# Patient Record
Sex: Female | Born: 2000 | Race: Black or African American | Hispanic: No | Marital: Single | Smoking: Never smoker
Health system: Southern US, Community
[De-identification: ages and names within clinical notes are randomized; demographics above are authoritative.]

## PROBLEM LIST (undated history)

## (undated) DIAGNOSIS — F319 Bipolar disorder, unspecified: Secondary | ICD-10-CM

## (undated) DIAGNOSIS — Z91148 Patient's other noncompliance with medication regimen for other reason: Secondary | ICD-10-CM

## (undated) DIAGNOSIS — Z8659 Personal history of other mental and behavioral disorders: Secondary | ICD-10-CM

## (undated) DIAGNOSIS — F32A Depression, unspecified: Secondary | ICD-10-CM

## (undated) DIAGNOSIS — F329 Major depressive disorder, single episode, unspecified: Secondary | ICD-10-CM

## (undated) DIAGNOSIS — F4310 Post-traumatic stress disorder, unspecified: Secondary | ICD-10-CM

## (undated) DIAGNOSIS — F603 Borderline personality disorder: Secondary | ICD-10-CM

## (undated) DIAGNOSIS — T1491XA Suicide attempt, initial encounter: Secondary | ICD-10-CM

## (undated) DIAGNOSIS — F909 Attention-deficit hyperactivity disorder, unspecified type: Secondary | ICD-10-CM

## (undated) DIAGNOSIS — F411 Generalized anxiety disorder: Secondary | ICD-10-CM

## (undated) DIAGNOSIS — F419 Anxiety disorder, unspecified: Secondary | ICD-10-CM

## (undated) DIAGNOSIS — E669 Obesity, unspecified: Secondary | ICD-10-CM

## (undated) HISTORY — PX: FOOT SURGERY: SHX648

---
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
## 2020-01-20 ENCOUNTER — Emergency Department (HOSPITAL_COMMUNITY): Payer: Medicaid Other

## 2020-01-20 ENCOUNTER — Other Ambulatory Visit: Payer: Self-pay

## 2020-01-20 ENCOUNTER — Emergency Department (HOSPITAL_COMMUNITY)
Admission: EM | Admit: 2020-01-20 | Discharge: 2020-01-20 | Disposition: A | Discharge status: Home / Self Care | Payer: Medicaid Other | Attending: Emergency Medicine | Admitting: Emergency Medicine

## 2020-01-20 ENCOUNTER — Encounter (HOSPITAL_COMMUNITY): Payer: Self-pay | Admitting: Emergency Medicine

## 2020-01-20 DIAGNOSIS — Z91010 Allergy to peanuts: Secondary | ICD-10-CM | POA: Diagnosis not present

## 2020-01-20 DIAGNOSIS — Y999 Unspecified external cause status: Secondary | ICD-10-CM | POA: Insufficient documentation

## 2020-01-20 DIAGNOSIS — Y939 Activity, unspecified: Secondary | ICD-10-CM | POA: Diagnosis not present

## 2020-01-20 DIAGNOSIS — Z79899 Other long term (current) drug therapy: Secondary | ICD-10-CM | POA: Insufficient documentation

## 2020-01-20 DIAGNOSIS — Y92009 Unspecified place in unspecified non-institutional (private) residence as the place of occurrence of the external cause: Secondary | ICD-10-CM | POA: Insufficient documentation

## 2020-01-20 DIAGNOSIS — Z23 Encounter for immunization: Secondary | ICD-10-CM | POA: Insufficient documentation

## 2020-01-20 DIAGNOSIS — W25XXXA Contact with sharp glass, initial encounter: Secondary | ICD-10-CM | POA: Insufficient documentation

## 2020-01-20 DIAGNOSIS — W458XXA Other foreign body or object entering through skin, initial encounter: Secondary | ICD-10-CM

## 2020-01-20 DIAGNOSIS — S90852A Superficial foreign body, left foot, initial encounter: Secondary | ICD-10-CM | POA: Insufficient documentation

## 2020-01-20 MED ORDER — LIDOCAINE HCL (PF) 1 % IJ SOLN
5.0000 mL | Freq: Once | INTRAMUSCULAR | Status: AC
Start: 2020-01-20 — End: 2020-01-20
  Administered 2020-01-20: 12:00:00 5 mL
  Filled 2020-01-20: qty 5

## 2020-01-20 MED ORDER — TETANUS-DIPHTH-ACELL PERTUSSIS 5-2.5-18.5 LF-MCG/0.5 IM SUSP
0.5000 mL | Freq: Once | INTRAMUSCULAR | Status: AC
Start: 2020-01-20 — End: 2020-01-20
  Administered 2020-01-20: 0.5 mL via INTRAMUSCULAR
  Filled 2020-01-20: qty 0.5

## 2020-01-20 NOTE — Discharge Instructions (Signed)
You may place Neosporin or bacitracin ointment to your wound.  This should close on its own.  Run warm soapy water over this.  Follow-up with primary care provider for wound check in 5 days.

## 2020-01-20 NOTE — ED Triage Notes (Signed)
Pt reports stepping on something with her L foot while cleaning her room earlier, states that she tried to get whatever it was out but was unable to.

## 2020-01-20 NOTE — ED Provider Notes (Signed)
[facility] EMERGENCY DEPARTMENT Provider Note   CSN: [id number] Arrival date & time: 01/20/20  1024     History Chief Complaint  Patient presents with  . Foot Injury    Tamara Guzman is a 19 y.o. female with past medical history significant for bipolar, anxiety who presents for evaluation of foreign object to left foot.  Patient states she stepped on unknown object to her room.  Patient assumes this is glass her did not see any broken glass.  She has noticed some pain to the plantar aspect of her left foot with possible dark object.  She denies fever, chills, nausea, vomiting, decreased range of motion, numbness, tingling, redness, swelling, warmth to her extremities.  Denies additional aggravating or alleviating factors.  Rates pain a 5/10 the plantar aspect of her left foot.  Unsure last tetanus.  Denies additional aggravating or alleviating factors.  History obtained from patient and past medical records.  No interpreter is used.  HPI     Past Medical History:  Diagnosis Date  . ADHD   . Anxiety   . Bipolar 1 disorder (HCC)   . Depression   . Obesity     Patient Active Problem List   Diagnosis Date Noted  . MDD (major depressive disorder), severe (HCC) 07/09/2018  . Post traumatic stress disorder (PTSD) 07/09/2018  . ADHD (attention deficit hyperactivity disorder) 07/09/2018    History reviewed. No pertinent surgical history.   OB History   No obstetric history on file.     No family history on file.  Social History   Tobacco Use  . Smoking status: Never Smoker  . Smokeless tobacco: Never Used  Substance Use Topics  . Alcohol use: Not Currently  . Drug use: Yes    Types: Marijuana    Home Medications Prior to Admission medications   Medication Sig Start Date End Date Taking? Authorizing Provider  ARIPiprazole (ABILIFY) 20 MG tablet Take 1 tablet (20 mg total) by mouth at bedtime. 07/13/18   Leata Mouse, MD  cloNIDine  HCl (KAPVAY) 0.1 MG TB12 ER tablet Take 2 tablets (0.2 mg total) by mouth at bedtime. 07/13/18   Leata Mouse, MD  dicyclomine (BENTYL) 20 MG tablet Take 20 mg by mouth 4 (four) times daily. 04/26/18   [provider]  FLUoxetine (PROZAC) 20 MG capsule Take 1 capsule (20 mg total) by mouth every morning. 07/13/18   Leata Mouse, MD  lansoprazole (PREVACID) 15 MG capsule Take 1 capsule (15 mg total) by mouth daily. 10/06/19   Niel Hummer, MD  loperamide (IMODIUM) 2 MG capsule Take 2 mg by mouth every 4 (four) hours as needed for diarrhea or loose stools.  04/26/18   [provider]  methylphenidate 36 MG PO CR tablet Take 1 tablet (36 mg total) by mouth every morning. 07/14/18   Leata Mouse, MD  ondansetron (ZOFRAN ODT) 4 MG disintegrating tablet Take 1 tablet (4 mg total) by mouth every 8 (eight) hours as needed for nausea or vomiting. 10/06/19   Niel Hummer, MD  ondansetron (ZOFRAN) 4 MG tablet Take 4 mg by mouth every 8 (eight) hours as needed for nausea. 04/26/18   [provider]  Oxcarbazepine (TRILEPTAL) 300 MG tablet Take 3 tablets (900 mg total) by mouth at bedtime. 07/13/18   Leata Mouse, MD  QUEtiapine (SEROQUEL) 25 MG tablet Take 1 tablet (25 mg total) by mouth at bedtime. 07/13/18   Leata Mouse, MD  ranitidine (ZANTAC) 150 MG capsule Take  150 mg by mouth 2 (two) times daily. 04/14/18   [provider]    Allergies    Other, Chocolate, and Peanut-containing drug products  Review of Systems   Review of Systems  Constitutional: Negative.   HENT: Negative.   Respiratory: Negative.   Cardiovascular: Negative.   Gastrointestinal: Negative.   Genitourinary: Negative.   Musculoskeletal: Negative.   Skin: Positive for wound.  Neurological: Negative.   All other systems reviewed and are negative.   Physical Exam Updated Vital Signs BP 114/74 (BP Location: Right Arm)   Pulse 89   Temp 98.3 F  (36.8 C) (Oral)   Resp 14   SpO2 100%   Physical Exam Vitals and nursing note reviewed.  Constitutional:      General: She is not in acute distress.    Appearance: She is well-developed. She is obese. She is not ill-appearing or toxic-appearing.  HENT:     Head: Normocephalic and atraumatic.     Nose: Nose normal.     Mouth/Throat:     Mouth: Mucous membranes are moist.     Pharynx: Oropharynx is clear.  Eyes:     Pupils: Pupils are equal, round, and reactive to light.  Cardiovascular:     Rate and Rhythm: Normal rate.     Pulses: Normal pulses.     Heart sounds: Normal heart sounds.  Pulmonary:     Effort: Pulmonary effort is normal. No respiratory distress.     Breath sounds: Normal breath sounds.  Abdominal:     General: Bowel sounds are normal. There is no distension.  Musculoskeletal:        General: No swelling or deformity. Normal range of motion.     Cervical back: Normal range of motion.     Right lower leg: No edema.     Comments: Moves all 4 extremities without difficulty.  Skin:    General: Skin is warm and dry.     Capillary Refill: Capillary refill takes less than 2 seconds.     Comments: Punctate area of darkened colored skin to plantar aspect of left foot between second and third metatarsal.  Possible palpable object however not visualized fully.  No bleeding, drainage, fluctuance, induration, erythema or warmth.  Neurological:     Mental Status: She is alert.     Comments: Ambulatory without difficulty     ED Results / Procedures / Treatments   Labs (all labs ordered are listed, but only abnormal results are displayed) Labs Reviewed - No data to display  EKG None  Radiology DG Foot Complete Left  Result Date: 01/20/2020 CLINICAL DATA:  19 year old female with possible foreign body at the foot EXAM: LEFT FOOT - COMPLETE 3+ VIEW COMPARISON:  None. FINDINGS: No acute displaced fracture. No focal soft tissue swelling. No degenerative changes. No  subluxation/dislocation. No subcutaneous gas. No radiopaque foreign body. IMPRESSION: Negative for acute bony abnormality. Negative for radiopaque foreign body. Electronically Signed   By: Gilmer Mor D.O.   On: 01/20/2020 11:38    Procedures .Foreign Body Removal  Date/Time: 01/20/2020 12:26 PM Performed by: Linwood Dibbles, PA-C Authorized by: Linwood Dibbles, PA-C  Consent: Verbal consent obtained. Written consent not obtained. Risks and benefits: risks, benefits and alternatives were discussed Consent given by: patient Patient understanding: patient states understanding of the procedure being performed Patient consent: the patient's understanding of the procedure matches consent given Procedure consent: procedure consent matches procedure scheduled Relevant documents: relevant documents present and verified Test results:  test results available and properly labeled Site marked: the operative site was marked Imaging studies: imaging studies available Required items: required blood products, implants, devices, and special equipment available Patient identity confirmed: verbally with patient Time out: Immediately prior to procedure a "time out" was called to verify the correct patient, procedure, equipment, support staff and site/side marked as required. Body area: skin General location: lower extremity Location details: left foot Anesthesia: local infiltration  Anesthesia: Local Anesthetic: lidocaine 1% without epinephrine Anesthetic total: 2 mL  Sedation: Patient sedated: no  Patient restrained: no Patient cooperative: yes Localization method: serial x-rays, visualized and probed Removal mechanism: forceps and irrigation Dressing: antibiotic ointment and dressing applied Tendon involvement: none Depth: subcutaneous Complexity: complex 1 objects recovered. Objects recovered: glass Post-procedure assessment: foreign body removed Patient tolerance: patient tolerated  the procedure well with no immediate complications   (including critical care time)  Medications Ordered in ED Medications  lidocaine (PF) (XYLOCAINE) 1 % injection 5 mL (has no administration in time range)  Tdap (BOOSTRIX) injection 0.5 mL (0.5 mLs Intramuscular Given 01/20/20 1203)    ED Course  I have reviewed the triage vital signs and the nursing notes.  Pertinent labs & imaging results that were available during my care of the patient were reviewed by me and considered in my medical decision making (see chart for details).  19 year old female presents for possible foreign object to plantar aspect of left foot.  Unknown tetanus.  Will update.  Will get x-rays to assess for foreign body.  She does have punctate area of darkened color skin to plantar aspect of left foot between second and third metatarsal.  No evidence of infectious process.  She is neurovascularly intact.  Normal musculoskeletal exam.  X-ray without evidence of radiopaque foreign object.  Glass removed from patient's foot.  See procedure note.  Tolerated well.  Discussed warm salt water soaks, bacitracin ointment.  She is ambulatory that difficulty.  She is to return for any new or worsening symptoms.  The patient has been appropriately medically screened and/or stabilized in the ED. I have low suspicion for any other emergent medical condition which would require further screening, evaluation or treatment in the ED or require inpatient management.    MDM Rules/Calculators/A&P                       Final Clinical Impression(s) / ED Diagnoses Final diagnoses:  Contact with sharp glass, initial encounter  Other foreign body or object entering through skin, initial encounter    Rx / DC Orders ED Discharge Orders    None       Vesta Wheeland A, PA-C 01/20/20 1229    Quintella Reichert, MD 01/21/20 1206

## 2020-01-20 NOTE — ED Notes (Signed)
Patient verbalizes understanding of discharge instructions. Opportunity for questioning and answers were provided. Armband removed by staff, pt discharged from ED.  

## 2020-03-27 ENCOUNTER — Ambulatory Visit: Payer: Medicaid Other | Attending: Family

## 2020-03-27 DIAGNOSIS — Z23 Encounter for immunization: Secondary | ICD-10-CM

## 2020-03-27 NOTE — Progress Notes (Signed)
   Covid-19 Vaccination Clinic  Name:  Tamara Guzman    MRN: [id number] DOB: 10-16-2001  03/27/2020  Ms. Tamara Guzman was observed post Covid-19 immunization for 15 minutes without incident. She was provided with Vaccine Information Sheet and instruction to access the V-Safe system.   Ms. Tamara Guzman was instructed to call 911 with any severe reactions post vaccine: Marland Kitchen Difficulty breathing  . Swelling of face and throat  . A fast heartbeat  . A bad rash all over body  . Dizziness and weakness   Immunizations Administered    Name Date Dose VIS Date Route   Moderna COVID-19 Vaccine 03/27/2020 10:23 AM 0.5 mL 11/13/2019 Intramuscular   Manufacturer: Gala Murdoch   Lot: [id number]   NDC: 26834-196-22      Covid-19 Vaccination Clinic  Name:  Tamara Guzman    MRN: [id number] DOB: May 15, 2001  03/27/2020  Ms. Tamara Guzman was observed post Covid-19 immunization for 15 minutes without incident. She was provided with Vaccine Information Sheet and instruction to access the V-Safe system.   Ms. Tamara Guzman was instructed to call 911 with any severe reactions post vaccine: Marland Kitchen Difficulty breathing  . Swelling of face and throat  . A fast heartbeat  . A bad rash all over body  . Dizziness and weakness   Immunizations Administered    Name Date Dose VIS Date Route   Moderna COVID-19 Vaccine 03/27/2020 10:23 AM 0.5 mL 11/13/2019 Intramuscular   Manufacturer: Moderna   Lot: [id number]   NDC: 14481-856-31

## 2020-03-31 ENCOUNTER — Ambulatory Visit (INDEPENDENT_AMBULATORY_CARE_PROVIDER_SITE_OTHER): Payer: Medicaid Other

## 2020-03-31 ENCOUNTER — Ambulatory Visit (HOSPITAL_COMMUNITY)
Admission: EM | Admit: 2020-03-31 | Discharge: 2020-03-31 | Disposition: A | Discharge status: Home / Self Care | Payer: Medicaid Other | Attending: Family Medicine | Admitting: Family Medicine

## 2020-03-31 ENCOUNTER — Other Ambulatory Visit: Payer: Self-pay

## 2020-03-31 DIAGNOSIS — K5904 Chronic idiopathic constipation: Secondary | ICD-10-CM

## 2020-03-31 DIAGNOSIS — Z3202 Encounter for pregnancy test, result negative: Secondary | ICD-10-CM

## 2020-03-31 LAB — POCT URINALYSIS DIP (DEVICE)
Glucose, UA: NEGATIVE mg/dL
Hgb urine dipstick: NEGATIVE
Ketones, ur: 15 mg/dL — AB
Leukocytes,Ua: NEGATIVE
Nitrite: NEGATIVE
Protein, ur: 30 mg/dL — AB
Specific Gravity, Urine: >=1.03 (ref 1.005–1.030)
Urobilinogen, UA: 1 mg/dL (ref 0.0–1.0)
pH: 6 (ref 5.0–8.0)

## 2020-03-31 LAB — POC URINE PREG, ED: Preg Test, Ur: NEGATIVE

## 2020-03-31 LAB — POCT PREGNANCY, URINE: Preg Test, Ur: NEGATIVE

## 2020-03-31 MED ORDER — ONDANSETRON HCL 4 MG PO TABS: 4.0000 mg | ORAL_TABLET | Freq: Four times a day (QID) | ORAL | 0 refills | Status: DC

## 2020-03-31 MED ORDER — POLYETHYLENE GLYCOL 3350 17 G PO PACK: 17.0000 g | PACK | Freq: Every day | ORAL | 0 refills | Status: DC

## 2020-03-31 MED ORDER — TRAMADOL-ACETAMINOPHEN 37.5-325 MG PO TABS: 1.0000 | ORAL_TABLET | Freq: Four times a day (QID) | ORAL | 0 refills | Status: DC | PRN

## 2020-03-31 NOTE — Discharge Instructions (Signed)
Take the ultracet if needed for pain Do not drive on the ultracet Take the miralax 2 x a day until you have a bowel movement, then reduce to one time a day Take zofran for nausea Drink plenty of water See your regular pediatrician or primary care for follow up

## 2020-03-31 NOTE — ED Triage Notes (Signed)
Pt c/o lower abdominal pain x 1 week. States she has not had a bowel movement in "like a month" and also states she is "unable to keep anything down for a week."

## 2020-03-31 NOTE — ED Provider Notes (Signed)
[facility]    CSN: [id number] Arrival date & time: 03/31/20  0920      History   Chief Complaint Chief Complaint  Patient presents with  . Abdominal Pain    HPI Tamara Guzman is a 19 y.o. female.   HPI  Patient states she suffers from chronic constipation She states she has not had a normal bowel movement for a month She states that she has had a decreased appetite and some vomiting this week She thinks that she has a bowel blockage Patient previously was on a lot of medications but most of these have been discontinued She is under the care of [facility] for bipolar illness and ADD She is uncertain of her medicines The only 1 she remembers is Seroquel  Past Medical History:  Diagnosis Date  . ADHD   . Anxiety   . Bipolar 1 disorder (HCC)   . Depression   . Obesity     Patient Active Problem List   Diagnosis Date Noted  . MDD (major depressive disorder), severe (HCC) 07/09/2018  . Post traumatic stress disorder (PTSD) 07/09/2018  . ADHD (attention deficit hyperactivity disorder) 07/09/2018    No past surgical history on file.  OB History   No obstetric history on file.      Home Medications    Prior to Admission medications   Medication Sig Start Date End Date Taking? Authorizing Provider  ondansetron (ZOFRAN) 4 MG tablet Take 1 tablet (4 mg total) by mouth every 6 (six) hours. 03/31/20   Eustace Moore, MD  polyethylene glycol (MIRALAX) 17 g packet Take 17 g by mouth daily. 03/31/20   Eustace Moore, MD  QUEtiapine (SEROQUEL) 25 MG tablet Take 1 tablet (25 mg total) by mouth at bedtime. 07/13/18   Leata Mouse, MD  traMADol-acetaminophen (ULTRACET) 37.5-325 MG tablet Take 1-2 tablets by mouth every 6 (six) hours as needed. 03/31/20   Eustace Moore, MD  ARIPiprazole (ABILIFY) 20 MG tablet Take 1 tablet (20 mg total) by mouth at bedtime. 07/13/18 03/31/20  Leata Mouse, MD  cloNIDine HCl (KAPVAY) 0.1 MG TB12 ER  tablet Take 2 tablets (0.2 mg total) by mouth at bedtime. 07/13/18 03/31/20  Leata Mouse, MD  dicyclomine (BENTYL) 20 MG tablet Take 20 mg by mouth 4 (four) times daily. 04/26/18 03/31/20  [provider]  FLUoxetine (PROZAC) 20 MG capsule Take 1 capsule (20 mg total) by mouth every morning. 07/13/18 03/31/20  Leata Mouse, MD  lansoprazole (PREVACID) 15 MG capsule Take 1 capsule (15 mg total) by mouth daily. 10/06/19 03/31/20  Niel Hummer, MD  methylphenidate 36 MG PO CR tablet Take 1 tablet (36 mg total) by mouth every morning. 07/14/18 03/31/20  Leata Mouse, MD  Oxcarbazepine (TRILEPTAL) 300 MG tablet Take 3 tablets (900 mg total) by mouth at bedtime. 07/13/18 03/31/20  Leata Mouse, MD  ranitidine (ZANTAC) 150 MG capsule Take 150 mg by mouth 2 (two) times daily. 04/14/18 03/31/20  [provider]    Family History No family history on file.  Social History Social History   Tobacco Use  . Smoking status: Never Smoker  . Smokeless tobacco: Never Used  Substance Use Topics  . Alcohol use: Not Currently  . Drug use: Yes    Types: Marijuana     Allergies   Other, Chocolate, and Peanut-containing drug products   Review of Systems Review of Systems  Constitutional: Positive for appetite change. Negative for chills, fever and unexpected weight change.  Gastrointestinal: Positive for abdominal pain, constipation, nausea and vomiting.     Physical Exam Triage Vital Signs ED Triage Vitals [03/31/20 0944]  Enc Vitals Group     BP 112/64     Pulse Rate 89     Resp 16     Temp 98.8 F (37.1 C)     Temp src      SpO2 98 %     Weight      Height      Head Circumference      Peak Flow      Pain Score 6     Pain Loc      Pain Edu?      Excl. in [place]?    No data found.  Updated Vital Signs BP 112/64   Pulse 89   Temp 98.8 F (37.1 C)   Resp 16   LMP 01/20/2020 (Approximate) Comment: Negative pregnancy test prior to KUB   SpO2 98%      Physical Exam Constitutional:      General: She is not in acute distress.    Appearance: She is well-developed.     Comments: Appears uncomfortable.  Paces around the exam room.  Overweight  HENT:     Head: Normocephalic and atraumatic.     Mouth/Throat:     Mouth: Mucous membranes are moist.  Eyes:     Conjunctiva/sclera: Conjunctivae normal.     Pupils: Pupils are equal, round, and reactive to light.  Cardiovascular:     Rate and Rhythm: Normal rate and regular rhythm.     Heart sounds: Normal heart sounds.  Pulmonary:     Effort: Pulmonary effort is normal. No respiratory distress.     Breath sounds: Normal breath sounds.  Abdominal:     General: Abdomen is protuberant. Bowel sounds are decreased. There is no distension.     Palpations: Abdomen is soft. There is no hepatomegaly, splenomegaly or mass.     Tenderness: There is generalized abdominal tenderness. There is no guarding or rebound.     Comments: Patient appears uncomfortable.  Cries and pushes my hand away as I examine her abdomen  Musculoskeletal:        General: Normal range of motion.     Cervical back: Normal range of motion.  Skin:    General: Skin is warm and dry.  Neurological:     General: No focal deficit present.     Mental Status: She is alert.   X-rays negative I reexamined patient at this time her abdomen is only mildly tender throughout   UC Treatments / Results  Labs (all labs ordered are listed, but only abnormal results are displayed) Labs Reviewed  POCT URINALYSIS DIP (DEVICE) - Abnormal; Notable for the following components:      Result Value   Bilirubin Urine SMALL (*)    Ketones, ur 15 (*)    Protein, ur 30 (*)    All other components within normal limits  POCT PREGNANCY, URINE  POC URINE PREG, ED    EKG   Radiology DG Abd 1 View  Result Date: 03/31/2020 CLINICAL DATA:  19 year old female with abdominal pain for over a week, reports last normal bowel movement  was a month ago. EXAM: ABDOMEN - 1 VIEW COMPARISON:  Abdominal radiographs 10/06/2019. FINDINGS: Supine views of the abdomen and pelvis. Paucity of bowel gas, nonobstructed pattern. No significant volume of retained stool identified. Abdominal and pelvic visceral contours are within normal limits. No osseous abnormality identified.  IMPRESSION: Paucity of bowel gas with no evidence of bowel obstruction or significant retain stool. Electronically Signed   By: Odessa Fleming M.D.   On: 03/31/2020 10:42    Procedures Procedures (including critical care time)  Medications Ordered in UC Medications - No data to display  Initial Impression / Assessment and Plan / UC Course  I have reviewed the triage vital signs and the nursing notes.  Pertinent labs & imaging results that were available during my care of the patient were reviewed by me and considered in my medical decision making (see chart for details).     Reviewed chronic constipation Follow-up with pediatrician Final Clinical Impressions(s) / UC Diagnoses   Final diagnoses:  Chronic idiopathic constipation     Discharge Instructions     Take the ultracet if needed for pain Do not drive on the ultracet Take the miralax 2 x a day until you have a bowel movement, then reduce to one time a day Take zofran for nausea Drink plenty of water See your regular pediatrician or primary care for follow up   ED Prescriptions    Medication Sig Dispense Auth. Provider   ondansetron (ZOFRAN) 4 MG tablet Take 1 tablet (4 mg total) by mouth every 6 (six) hours. 12 tablet Eustace Moore, MD   polyethylene glycol (MIRALAX) 17 g packet Take 17 g by mouth daily. 14 each Eustace Moore, MD   traMADol-acetaminophen (ULTRACET) 37.5-325 MG tablet Take 1-2 tablets by mouth every 6 (six) hours as needed. 20 tablet Eustace Moore, MD     I have reviewed the PDMP during this encounter.   Eustace Moore, MD 03/31/20 [phone]

## 2020-04-05 ENCOUNTER — Encounter (HOSPITAL_COMMUNITY): Payer: Self-pay

## 2020-04-05 ENCOUNTER — Inpatient Hospital Stay (HOSPITAL_COMMUNITY)
Admission: EM | Admit: 2020-04-05 | Discharge: 2020-04-07 | DRG: 918 | Disposition: A | Discharge status: Other Acute Inpatient Hospital | Payer: Medicaid Other | Attending: Internal Medicine | Admitting: Internal Medicine

## 2020-04-05 ENCOUNTER — Other Ambulatory Visit: Payer: Self-pay

## 2020-04-05 DIAGNOSIS — T368X2A Poisoning by other systemic antibiotics, intentional self-harm, initial encounter: Secondary | ICD-10-CM | POA: Diagnosis present

## 2020-04-05 DIAGNOSIS — R739 Hyperglycemia, unspecified: Secondary | ICD-10-CM | POA: Diagnosis present

## 2020-04-05 DIAGNOSIS — R451 Restlessness and agitation: Secondary | ICD-10-CM | POA: Diagnosis present

## 2020-04-05 DIAGNOSIS — F322 Major depressive disorder, single episode, severe without psychotic features: Secondary | ICD-10-CM | POA: Diagnosis present

## 2020-04-05 DIAGNOSIS — R001 Bradycardia, unspecified: Secondary | ICD-10-CM | POA: Diagnosis present

## 2020-04-05 DIAGNOSIS — F419 Anxiety disorder, unspecified: Secondary | ICD-10-CM | POA: Diagnosis present

## 2020-04-05 DIAGNOSIS — F4310 Post-traumatic stress disorder, unspecified: Secondary | ICD-10-CM | POA: Diagnosis present

## 2020-04-05 DIAGNOSIS — T50912A Poisoning by multiple unspecified drugs, medicaments and biological substances, intentional self-harm, initial encounter: Secondary | ICD-10-CM | POA: Diagnosis not present

## 2020-04-05 DIAGNOSIS — T43592A Poisoning by other antipsychotics and neuroleptics, intentional self-harm, initial encounter: Secondary | ICD-10-CM | POA: Diagnosis present

## 2020-04-05 DIAGNOSIS — T50902A Poisoning by unspecified drugs, medicaments and biological substances, intentional self-harm, initial encounter: Secondary | ICD-10-CM | POA: Diagnosis present

## 2020-04-05 DIAGNOSIS — F909 Attention-deficit hyperactivity disorder, unspecified type: Secondary | ICD-10-CM | POA: Diagnosis present

## 2020-04-05 DIAGNOSIS — E669 Obesity, unspecified: Secondary | ICD-10-CM | POA: Diagnosis present

## 2020-04-05 DIAGNOSIS — Z79899 Other long term (current) drug therapy: Secondary | ICD-10-CM

## 2020-04-05 DIAGNOSIS — Z20822 Contact with and (suspected) exposure to covid-19: Secondary | ICD-10-CM | POA: Diagnosis present

## 2020-04-05 DIAGNOSIS — T465X2A Poisoning by other antihypertensive drugs, intentional self-harm, initial encounter: Principal | ICD-10-CM | POA: Diagnosis present

## 2020-04-05 DIAGNOSIS — T1491XA Suicide attempt, initial encounter: Secondary | ICD-10-CM

## 2020-04-05 LAB — COMPREHENSIVE METABOLIC PANEL
ALT: 12 U/L (ref 0–44)
AST: 22 U/L (ref 15–41)
Albumin: 4.4 g/dL (ref 3.5–5.0)
Alkaline Phosphatase: 90 U/L (ref 38–126)
Anion gap: 11 (ref 5–15)
BUN: 16 mg/dL (ref 6–20)
CO2: 22 mmol/L (ref 22–32)
Calcium: 9.3 mg/dL (ref 8.9–10.3)
Chloride: 102 mmol/L (ref 98–111)
Creatinine, Ser: 0.9 mg/dL (ref 0.44–1.00)
GFR calc Af Amer: >60 mL/min (ref >60)
GFR calc non Af Amer: >60 mL/min (ref >60)
Glucose, Bld: 235 mg/dL — ABNORMAL HIGH (ref 70–99)
Potassium: 4.9 mmol/L (ref 3.5–5.1)
Sodium: 135 mmol/L (ref 135–145)
Total Bilirubin: 0.6 mg/dL (ref 0.3–1.2)
Total Protein: 8.2 g/dL — ABNORMAL HIGH (ref 6.5–8.1)

## 2020-04-05 LAB — CBC WITH DIFFERENTIAL/PLATELET
Abs Immature Granulocytes: 0.03 10*3/uL (ref 0.00–0.07)
Basophils Absolute: 0.1 10*3/uL (ref 0.0–0.1)
Basophils Relative: 1 %
Eosinophils Absolute: 0.1 10*3/uL (ref 0.0–0.5)
Eosinophils Relative: 1 %
HCT: 44.9 % (ref 36.0–46.0)
Hemoglobin: 13.7 g/dL (ref 12.0–15.0)
Immature Granulocytes: 0 %
Lymphocytes Relative: 21 %
Lymphs Abs: 2.1 10*3/uL (ref 0.7–4.0)
MCH: 25 pg — ABNORMAL LOW (ref 26.0–34.0)
MCHC: 30.5 g/dL (ref 30.0–36.0)
MCV: 81.9 fL (ref 80.0–100.0)
Monocytes Absolute: 0.6 10*3/uL (ref 0.1–1.0)
Monocytes Relative: 6 %
Neutro Abs: 7.4 10*3/uL (ref 1.7–7.7)
Neutrophils Relative %: 71 %
Platelets: 353 10*3/uL (ref 150–400)
RBC: 5.48 MIL/uL — ABNORMAL HIGH (ref 3.87–5.11)
RDW: 14.6 % (ref 11.5–15.5)
WBC: 10.3 10*3/uL (ref 4.0–10.5)
nRBC: 0 % (ref 0.0–0.2)

## 2020-04-05 LAB — GLUCOSE, CAPILLARY
Glucose-Capillary: 126 mg/dL — ABNORMAL HIGH (ref 70–99)
Glucose-Capillary: 129 mg/dL — ABNORMAL HIGH (ref 70–99)

## 2020-04-05 LAB — MRSA PCR SCREENING: MRSA by PCR: NEGATIVE

## 2020-04-05 LAB — I-STAT CHEM 8, ED
BUN: 15 mg/dL (ref 6–20)
Calcium, Ion: 1.23 mmol/L (ref 1.15–1.40)
Chloride: 102 mmol/L (ref 98–111)
Creatinine, Ser: 0.8 mg/dL (ref 0.44–1.00)
Glucose, Bld: 233 mg/dL — ABNORMAL HIGH (ref 70–99)
HCT: 45 % (ref 36.0–46.0)
Hemoglobin: 15.3 g/dL — ABNORMAL HIGH (ref 12.0–15.0)
Potassium: 4.8 mmol/L (ref 3.5–5.1)
Sodium: 135 mmol/L (ref 135–145)
TCO2: 26 mmol/L (ref 22–32)

## 2020-04-05 LAB — I-STAT BETA HCG BLOOD, ED (MC, WL, AP ONLY): I-stat hCG, quantitative: <5 m[IU]/mL (ref <5)

## 2020-04-05 LAB — HEMOGLOBIN A1C
Hgb A1c MFr Bld: 5.7 % — ABNORMAL HIGH (ref 4.8–5.6)
Mean Plasma Glucose: 116.89 mg/dL

## 2020-04-05 LAB — RAPID URINE DRUG SCREEN, HOSP PERFORMED
Amphetamines: NOT DETECTED
Barbiturates: NOT DETECTED
Benzodiazepines: NOT DETECTED
Cocaine: NOT DETECTED
Opiates: NOT DETECTED
Tetrahydrocannabinol: POSITIVE — AB

## 2020-04-05 LAB — ETHANOL: Alcohol, Ethyl (B): <10 mg/dL (ref <10)

## 2020-04-05 LAB — BASIC METABOLIC PANEL
Anion gap: 8 (ref 5–15)
BUN: 12 mg/dL (ref 6–20)
CO2: 22 mmol/L (ref 22–32)
Calcium: 8.6 mg/dL — ABNORMAL LOW (ref 8.9–10.3)
Chloride: 108 mmol/L (ref 98–111)
Creatinine, Ser: 0.73 mg/dL (ref 0.44–1.00)
GFR calc Af Amer: >60 mL/min (ref >60)
GFR calc non Af Amer: >60 mL/min (ref >60)
Glucose, Bld: 130 mg/dL — ABNORMAL HIGH (ref 70–99)
Potassium: 4.2 mmol/L (ref 3.5–5.1)
Sodium: 138 mmol/L (ref 135–145)

## 2020-04-05 LAB — RESPIRATORY PANEL BY RT PCR (FLU A&B, COVID)
Influenza A by PCR: NEGATIVE
Influenza B by PCR: NEGATIVE
SARS Coronavirus 2 by RT PCR: NEGATIVE

## 2020-04-05 LAB — CBG MONITORING, ED
Glucose-Capillary: 128 mg/dL — ABNORMAL HIGH (ref 70–99)
Glucose-Capillary: 121 mg/dL — ABNORMAL HIGH (ref 70–99)

## 2020-04-05 LAB — SALICYLATE LEVEL: Salicylate Lvl: <7 mg/dL — ABNORMAL LOW (ref 7.0–30.0)

## 2020-04-05 LAB — ACETAMINOPHEN LEVEL
Acetaminophen (Tylenol), Serum: <10 ug/mL — ABNORMAL LOW (ref 10–30)
Acetaminophen (Tylenol), Serum: <10 ug/mL — ABNORMAL LOW (ref 10–30)

## 2020-04-05 MED ORDER — ENOXAPARIN SODIUM 40 MG/0.4ML ~~LOC~~ SOLN
40.0000 mg | SUBCUTANEOUS | Status: DC
  Administered 2020-04-05 – 2020-04-07 (×3): 40 mg via SUBCUTANEOUS
  Filled 2020-04-05 (×3): qty 0.4

## 2020-04-05 MED ORDER — SODIUM CHLORIDE 0.9% FLUSH
3.0000 mL | Freq: Two times a day (BID) | INTRAVENOUS | Status: DC
  Administered 2020-04-05 – 2020-04-06 (×3): 3 mL via INTRAVENOUS

## 2020-04-05 MED ORDER — INSULIN ASPART 100 UNIT/ML ~~LOC~~ SOLN
0.0000 [IU] | SUBCUTANEOUS | Status: DC
  Administered 2020-04-05 – 2020-04-07 (×11): 1 [IU] via SUBCUTANEOUS
  Filled 2020-04-05: qty 0.09

## 2020-04-05 MED ORDER — CHLORHEXIDINE GLUCONATE CLOTH 2 % EX PADS
6.0000 | MEDICATED_PAD | Freq: Every day | CUTANEOUS | Status: DC
  Administered 2020-04-05: 16:00:00 6 via TOPICAL

## 2020-04-05 MED ORDER — SODIUM CHLORIDE 0.9 % IV SOLN: INTRAVENOUS | Status: AC

## 2020-04-05 MED ORDER — SODIUM CHLORIDE 0.9 % IV SOLN: INTRAVENOUS | Status: DC

## 2020-04-05 MED ORDER — HYDRALAZINE HCL 20 MG/ML IJ SOLN
5.0000 mg | Freq: Four times a day (QID) | INTRAMUSCULAR | Status: DC | PRN
  Administered 2020-04-05: 17:00:00 5 mg via INTRAVENOUS
  Filled 2020-04-05: qty 1

## 2020-04-05 MED ORDER — SODIUM CHLORIDE 0.9 % IV BOLUS
1000.0000 mL | Freq: Once | INTRAVENOUS | Status: AC
  Administered 2020-04-05: 1000 mL via INTRAVENOUS

## 2020-04-05 MED ORDER — AMMONIA AROMATIC IN INHA
RESPIRATORY_TRACT | Status: AC
  Filled 2020-04-05: qty 20

## 2020-04-05 NOTE — ED Notes (Signed)
Call received from Beth at poison control who took pertinent information on pt and recommends 24 hour in patient medical observation.

## 2020-04-05 NOTE — H&P (Signed)
History and Physical    Tamara Guzman DQQ:[id number] DOB: [birth year] DOA: 04/05/2020  PCP: Patient, No Pcp Per   Patient coming from: Home   Chief Complaint: Intentional overdose with multiple medications   HPI: Tamara Guzman is a 19 y.o. female with medical history significant for depression, anxiety, and BMI 43, now presenting to the emergency department after an intentional overdose with multiple medications.  Patient is somnolent and not answering any questions at time of admission.  Her mother at the bedside provides history.  Patient has reportedly been in and out of behavioral health hospital for several years per report of her mother, has recently been complaining of stress related to school, and then yelled out for her mother overnight and was found to be on the phone with 911 and had various pills on the floor.  Patient reportedly took 1500 mg of Cipro, 1600 mg Latuda, unknown amount of clonidine, and possibly other medications.  ED Course: Upon arrival to the ED, patient is found to be afebrile, saturating well on room air, bradycardic, and with stable blood pressure.  EKG features sinus rhythm with rate 60 and QTc interval of 432.  Chemistry panel with glucose of 235.  CBC unremarkable.  Pregnancy test negative.  Ethanol, acetaminophen, and salicylate levels are all undetectable.  Patient was given a liter of saline in the ED, IVC paperwork is being filled out by the ED physician, Covid and influenza screening tests are in process, and poison control recommends observation in the hospital for 24 hours.  Review of Systems:  Unable to complete ROS due to the patient's clinical condition.  Past Medical History:  Diagnosis Date  . ADHD   . Anxiety   . Bipolar 1 disorder (HCC)   . Depression   . Obesity     History reviewed. No pertinent surgical history.   reports that she has never smoked. She has never used smokeless tobacco. She reports previous alcohol use. She  reports current drug use. Drug: Marijuana.  Allergies  Allergen Reactions  . Other Hives and Other (See Comments)    PATIENT DEVELOPS HIVES IF EXPOSED TO ANY TREE NUTS!! Peanut  . Chocolate Hives  . Cocoa Other (See Comments)  . Peanut-Containing Drug Products Hives    No family history on file.   Prior to Admission medications   Medication Sig Start Date End Date Taking? Authorizing Provider  ciprofloxacin (CIPRO) 500 MG tablet Take 500 mg by mouth once.   Yes [provider]  cloNIDine (CATAPRES) 0.2 MG tablet Take 0.2 mg by mouth once.   Yes [provider]  lurasidone (LATUDA) 40 MG TABS tablet Take 40 mg by mouth once.    Yes [provider]  Lurasidone HCl (LATUDA) 60 MG TABS Take 60 mg by mouth once.   Yes [provider]  methylphenidate 54 MG PO CR tablet Take 54 mg by mouth once.   Yes [provider]  polyethylene glycol (MIRALAX) 17 g packet Take 17 g by mouth daily. 03/31/20  Yes Eustace Moore, MD  ondansetron (ZOFRAN) 4 MG tablet Take 1 tablet (4 mg total) by mouth every 6 (six) hours. 03/31/20   Eustace Moore, MD  QUEtiapine (SEROQUEL) 25 MG tablet Take 1 tablet (25 mg total) by mouth at bedtime. Patient not taking: Reported on 04/05/2020 07/13/18   Leata Mouse, MD  traMADol-acetaminophen (ULTRACET) 37.5-325 MG tablet Take 1-2 tablets by mouth every 6 (six) hours as needed. Patient not taking: Reported  on 04/05/2020 03/31/20   Eustace Moore, MD  ARIPiprazole (ABILIFY) 20 MG tablet Take 1 tablet (20 mg total) by mouth at bedtime. 07/13/18 03/31/20  Leata Mouse, MD  cloNIDine HCl (KAPVAY) 0.1 MG TB12 ER tablet Take 2 tablets (0.2 mg total) by mouth at bedtime. 07/13/18 03/31/20  Leata Mouse, MD  dicyclomine (BENTYL) 20 MG tablet Take 20 mg by mouth 4 (four) times daily. 04/26/18 03/31/20  [provider]  FLUoxetine (PROZAC) 20 MG capsule Take 1 capsule (20 mg total) by mouth every  morning. 07/13/18 03/31/20  Leata Mouse, MD  lansoprazole (PREVACID) 15 MG capsule Take 1 capsule (15 mg total) by mouth daily. 10/06/19 03/31/20  Niel Hummer, MD  Oxcarbazepine (TRILEPTAL) 300 MG tablet Take 3 tablets (900 mg total) by mouth at bedtime. 07/13/18 03/31/20  Leata Mouse, MD  ranitidine (ZANTAC) 150 MG capsule Take 150 mg by mouth 2 (two) times daily. 04/14/18 03/31/20  [provider]    Physical Exam: Vitals:   04/05/20 0330 04/05/20 0400 04/05/20 0430 04/05/20 0500  BP: (!) 156/106 (!) 152/106 (!) 149/101 (!) 144/101  Pulse: (!) 42 (!) 43 (!) 42 (!) 41  Resp: 19 (!) 28 (!) 24 19  Temp:      TempSrc:      SpO2: 100% 100% 100% 100%  Weight:      Height:        Constitutional: NAD, sleeping   Eyes: PERTLA, lids and conjunctivae normal ENMT: Mucous membranes are moist. Posterior pharynx clear of any exudate or lesions.   Neck: normal, supple, no masses, no thyromegaly Respiratory:  no wheezing, no crackles. No accessory muscle use.  Cardiovascular: Rate ~45 and regular. No extremity edema.   Abdomen: No distension, no tenderness, soft. Bowel sounds active.  Musculoskeletal: no clubbing / cyanosis. No joint deformity upper and lower extremities.   Skin: no significant rashes, lesions, ulcers. Warm, dry, well-perfused. Neurologic: CN 2-12 grossly intact. Sensation intact. Moving all extremities spontaneously.    Psychiatric: Sleeping, wakes to tactile stimulation but not answering questions.      Labs and Imaging on Admission: I have personally reviewed following labs and imaging studies  CBC: Recent Labs  Lab 04/05/20 0149 04/05/20 0306  WBC 10.3  --   NEUTROABS 7.4  --   HGB 13.7 15.3*  HCT 44.9 45.0  MCV 81.9  --   PLT 353  --    Basic Metabolic Panel: Recent Labs  Lab 04/05/20 0149 04/05/20 0306  NA 135 135  K 4.9 4.8  CL 102 102  CO2 22  --   GLUCOSE 235* 233*  BUN 16 15  CREATININE 0.90 0.80  CALCIUM 9.3  --     GFR: Estimated Creatinine Clearance: 130.2 mL/min (by C-G formula based on SCr of 0.8 mg/dL). Liver Function Tests: Recent Labs  Lab 04/05/20 0149  AST 22  ALT 12  ALKPHOS 90  BILITOT 0.6  PROT 8.2*  ALBUMIN 4.4   No results for input(s): LIPASE, AMYLASE in the last 168 hours. No results for input(s): AMMONIA in the last 168 hours. Coagulation Profile: No results for input(s): INR, PROTIME in the last 168 hours. Cardiac Enzymes: No results for input(s): CKTOTAL, CKMB, CKMBINDEX, TROPONINI in the last 168 hours. BNP (last 3 results) No results for input(s): PROBNP in the last 8760 hours. HbA1C: No results for input(s): HGBA1C in the last 72 hours. CBG: No results for input(s): GLUCAP in the last 168 hours. Lipid Profile: No results for input(s):  CHOL, HDL, LDLCALC, TRIG, CHOLHDL, LDLDIRECT in the last 72 hours. Thyroid Function Tests: No results for input(s): TSH, T4TOTAL, FREET4, T3FREE, THYROIDAB in the last 72 hours. Anemia Panel: No results for input(s): VITAMINB12, FOLATE, FERRITIN, TIBC, IRON, RETICCTPCT in the last 72 hours. Urine analysis:    Component Value Date/Time   COLORURINE YELLOW 10/06/2019 1945   APPEARANCEUR HAZY (A) 10/06/2019 1945   LABSPEC >=1.030 03/31/2020 1000   PHURINE 6.0 03/31/2020 1000   GLUCOSEU NEGATIVE 03/31/2020 1000   HGBUR NEGATIVE 03/31/2020 1000   BILIRUBINUR SMALL (A) 03/31/2020 1000   KETONESUR 15 (A) 03/31/2020 1000   PROTEINUR 30 (A) 03/31/2020 1000   UROBILINOGEN 1.0 03/31/2020 1000   NITRITE NEGATIVE 03/31/2020 1000   LEUKOCYTESUR NEGATIVE 03/31/2020 1000   Sepsis Labs: @LABRCNTIP (procalcitonin:4,lacticidven:4) )No results found for this or any previous visit (from the past 240 hour(s)).   Radiological Exams on Admission: No results found.  EKG: Independently reviewed. Sinus rhythm, rate 60, QTc 432 ms.   Assessment/Plan    1. Intentional multidrug overdose  - Presents following intentional multidrug overdose  with 1600 mg Latuda, 1500 mg Cipro, unknown quantity of clonidine, and possibly other medications  - Poison control recommends 24-hr observation in hospital with EKG q4h, repeat APAP level, treat hypotension with IVF and then dopamine if needed, treat bradycardia with atropine as needed, and monitor for respiratory depression  - IVC forms being filled out by ED physician  - Continue suicide precautions    2. Depression  - Medications held due to overdose    3. Hyperglycemia  - Serum glucose 235 in ED  - Check A1c, check CBGs, use low-intensity SSI if needed     DVT prophylaxis: Lovenox  Code Status: Full  Family Communication: Mother updated at bedside  Disposition Plan:  Patient is from: Home Anticipated d/c is to: [facility] Anticipated d/c date is: 04/06/20  Patient currently: Requiring ongoing observation per Poison Control recommendations  Consults called: None   Admission status: Observation     Vianne Bulls, MD [facility] Pager: See [email or web address]  If 7AM-7PM, please contact the daytime attending [email or web address]  04/05/2020, 5:40 AM

## 2020-04-05 NOTE — ED Notes (Signed)
Pt woke up and ate 1/2 of her meal and went back to sleep. No distress. Resp wnl, equal and non-labored. Skin w/d/pink. Mother remains at bedside. Sitter with pt.
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
## 2020-04-05 NOTE — ED Notes (Signed)
Purewick applied. Pt woke up and stated "don't put a catheter in me" Pt made aware of what a purewick is. Pt agreeable and falls back to sleep while RN and NT apply purewick. Cont to monitor output.

## 2020-04-05 NOTE — Progress Notes (Signed)
19 year old lady with prior h/o depression, anxiety, obesity was brought in for intentional overdose with multiple medications. Patient has reportedly been in and out of behavioral health hospital for several years per report of her mother, has recently been complaining of stress related to school, and then yelled out for her mother overnight and was found to be on the phone with 911 and had various pills on the floor.  Patient reportedly took 1500 mg of Cipro, 1600 mg Latuda, unknown amount of clonidine, and possibly other medications. On exam, she is sleepy, but waking up to answer all questions appropriately.  Alert and oriented, grossly non focal.  CVS s1s2, RRR.  Lungs clear to auscultation, no wheezing or rhonchi.  Abdomen soft, non tender non distended bowel sounds wnl.  Extremities: no pedal edema.    Plan : Observe in Step down for the next 24 hours, check CMP in am, EKG in am.  Psychiatry consult.   Kathlen Mody , MD

## 2020-04-05 NOTE — BH Assessment (Signed)
[facility] Assessment Progress Note  Case was staffed with Tate NP who recommended a inpatient admission.     

## 2020-04-05 NOTE — ED Triage Notes (Signed)
Took: 3; 500mg  cipro.  10; 40mg  Latuda 15; 80mg  Latuda  Unknown amount of clonidine. Approx 20.  Other unknown medications. Unknown pills all over floor.

## 2020-04-05 NOTE — ED Provider Notes (Signed)
[facility] DEPT Provider Note  CSN: [id number] Arrival date & time: 04/05/20 0121  Chief Complaint(s) Suicide Attempt  ED Triage Notes Newman Pies, RN (Registered Nurse) . [facility] . . 04/05/2020 1:21 AM . . Signed   Took: 3; 500mg  cipro.  10; 40mg  Latuda 15; 80mg  Latuda  Unknown amount of clonidine. Approx 20.  Other unknown medications. Unknown pills all over floor.       HPI Tamara Guzman is a 19 y.o. female here for suicide attempt by overdose with drugs above.  Remainder of history, ROS, and physical exam limited due to patient's condition (somnolence and AMS). Additional information was obtained from EMS.   Level V Caveat.    HPI  Past Medical History Past Medical History:  Diagnosis Date  . ADHD   . Anxiety   . Bipolar 1 disorder ([place])   . Depression   . Obesity    Patient Active Problem List   Diagnosis Date Noted  . Drug overdose, multiple drugs, intentional self-harm, initial encounter ([place]) 04/05/2020  . Hyperglycemia 04/05/2020  . MDD (major depressive disorder), severe ([place]) 07/09/2018  . Post traumatic stress disorder (PTSD) 07/09/2018  . ADHD (attention deficit hyperactivity disorder) 07/09/2018   Home Medication(s) Prior to Admission medications   Medication Sig Start Date End Date Taking? Authorizing Provider  ciprofloxacin (CIPRO) 500 MG tablet Take 500 mg by mouth once.   Yes [provider]  cloNIDine (CATAPRES) 0.2 MG tablet Take 0.2 mg by mouth once.   Yes [provider]  lurasidone (LATUDA) 40 MG TABS tablet Take 40 mg by mouth once.    Yes [provider]  Lurasidone HCl (LATUDA) 60 MG TABS Take 60 mg by mouth once.   Yes [provider]  methylphenidate 54 MG PO CR tablet Take 54 mg by mouth once.   Yes [provider]  polyethylene glycol (MIRALAX) 17 g packet Take 17 g by mouth daily. 03/31/20  Yes Raylene Everts, MD  ondansetron  (ZOFRAN) 4 MG tablet Take 1 tablet (4 mg total) by mouth every 6 (six) hours. 03/31/20   Raylene Everts, MD  QUEtiapine (SEROQUEL) 25 MG tablet Take 1 tablet (25 mg total) by mouth at bedtime. Patient not taking: Reported on 04/05/2020 07/13/18   Ambrose Finland, MD  traMADol-acetaminophen (ULTRACET) 37.5-325 MG tablet Take 1-2 tablets by mouth every 6 (six) hours as needed. Patient not taking: Reported on 04/05/2020 03/31/20   Raylene Everts, MD  ARIPiprazole (ABILIFY) 20 MG tablet Take 1 tablet (20 mg total) by mouth at bedtime. 07/13/18 03/31/20  Ambrose Finland, MD  cloNIDine HCl (KAPVAY) 0.1 MG TB12 ER tablet Take 2 tablets (0.2 mg total) by mouth at bedtime. 07/13/18 03/31/20  Ambrose Finland, MD  dicyclomine (BENTYL) 20 MG tablet Take 20 mg by mouth 4 (four) times daily. 04/26/18 03/31/20  [provider]  FLUoxetine (PROZAC) 20 MG capsule Take 1 capsule (20 mg total) by mouth every morning. 07/13/18 03/31/20  Ambrose Finland, MD  lansoprazole (PREVACID) 15 MG capsule Take 1 capsule (15 mg total) by mouth daily. 10/06/19 03/31/20  Louanne Skye, MD  Oxcarbazepine (TRILEPTAL) 300 MG tablet Take 3 tablets (900 mg total) by mouth at bedtime. 07/13/18 03/31/20  Ambrose Finland, MD  ranitidine (ZANTAC) 150 MG capsule Take 150 mg by mouth 2 (two) times daily. 04/14/18 03/31/20  [provider]  Past Surgical History History reviewed. No pertinent surgical history. Family History No family history on file.  Social History Social History   Tobacco Use  . Smoking status: Never Smoker  . Smokeless tobacco: Never Used  Substance Use Topics  . Alcohol use: Not Currently  . Drug use: Yes    Types: Marijuana   Allergies Other, Chocolate, Cocoa, and Peanut-containing drug products  Review of Systems Review of Systems    Unable to perform ROS: Mental status change    Physical Exam Vital Signs  I have reviewed the triage vital signs BP (!) 145/94   Pulse (!) 42   Temp 98.1 F (36.7 C) (Oral)   Resp (!) 22   Ht 5\' 2"  (1.575 m)   Wt 105.7 kg   SpO2 100%   BMI 42.62 kg/m   Physical Exam Vitals reviewed.  Constitutional:      General: She is not in acute distress.    Appearance: She is well-developed. She is not diaphoretic.  HENT:     Head: Normocephalic and atraumatic.     Nose: Nose normal.  Eyes:     General: No scleral icterus.       Right eye: No discharge.        Left eye: No discharge.     Conjunctiva/sclera: Conjunctivae normal.     Pupils: Pupils are equal, round, and reactive to light.  Cardiovascular:     Rate and Rhythm: Normal rate and regular rhythm.     Heart sounds: No murmur. No friction rub. No gallop.   Pulmonary:     Effort: Pulmonary effort is normal. No respiratory distress.     Breath sounds: Normal breath sounds. No stridor. No rales.  Abdominal:     General: There is no distension.     Palpations: Abdomen is soft.     Tenderness: There is no abdominal tenderness.  Musculoskeletal:        General: No tenderness.     Cervical back: Normal range of motion and neck supple.  Skin:    General: Skin is warm and dry.     Findings: No erythema or rash.  Neurological:     Mental Status: She is lethargic.     Comments: Withdraws to pain. Will awake but mumble     ED Results and Treatments Labs (all labs ordered are listed, but only abnormal results are displayed) Labs Reviewed  COMPREHENSIVE METABOLIC PANEL - Abnormal; Notable for the following components:      Result Value   Glucose, Bld 235 (*)    Total Protein 8.2 (*)    All other components within normal limits  SALICYLATE LEVEL - Abnormal; Notable for the following components:   Salicylate Lvl <7.0 (*)    All other components within normal limits  ACETAMINOPHEN LEVEL - Abnormal; Notable for the  following components:   Acetaminophen (Tylenol), Serum <10 (*)    All other components within normal limits  RAPID URINE DRUG SCREEN, HOSP PERFORMED - Abnormal; Notable for the following components:   Tetrahydrocannabinol POSITIVE (*)    All other components within normal limits  CBC WITH DIFFERENTIAL/PLATELET - Abnormal; Notable for the following components:   RBC 5.48 (*)    MCH 25.0 (*)    All other components within normal limits  CBG MONITORING, ED - Abnormal; Notable for the following components:   Glucose-Capillary 128 (*)    All other components within normal limits  I-STAT CHEM 8, ED - Abnormal; Notable for the  following components:   Glucose, Bld 233 (*)    Hemoglobin 15.3 (*)    All other components within normal limits  RESPIRATORY PANEL BY RT PCR (FLU A&B, COVID)  ETHANOL  ACETAMINOPHEN LEVEL  BASIC METABOLIC PANEL  HEMOGLOBIN A1C  I-STAT BETA HCG BLOOD, ED ([facility], [facility], [facility] ONLY)                                                                                                                         EKG  EKG Interpretation  Date/Time:  Saturday April 05 2020 01:29:07 EDT Ventricular Rate:  60 PR Interval:    QRS Duration: 96 QT Interval:  432 QTC Calculation: 432 R Axis:   81 Text Interpretation: Sinus rhythm NO STEMI. No old tracing to compare Confirmed by Drema Pry [phone]) on 04/05/2020 2:06:45 AM      Radiology No results found.  Pertinent labs & imaging results that were available during my care of the patient were reviewed by me and considered in my medical decision making (see chart for details).  Medications Ordered in ED Medications  insulin aspart (novoLOG) injection 0-9 Units (0 Units Subcutaneous Not Given 04/05/20 0634)  enoxaparin (LOVENOX) injection 40 mg (has no administration in time range)  sodium chloride flush (NS) 0.9 % injection 3 mL (has no administration in time range)  0.9 %  sodium chloride infusion (has no administration in time  range)  sodium chloride 0.9 % bolus 1,000 mL (0 mLs Intravenous Stopped 04/05/20 0523)  ammonia inhalant (  Given 04/05/20 0302)                                                                                                                                    Procedures .Critical Care Performed by: Nira Conn, MD Authorized by: Nira Conn, MD    CRITICAL CARE Performed by: Amadeo Garnet Alaira Level Total critical care time: 75 minutes Critical care time was exclusive of separately billable procedures and treating other patients. Critical care was necessary to treat or prevent imminent or life-threatening deterioration. Critical care was time spent personally by me on the following activities: development of treatment plan with patient and/or surrogate as well as nursing, discussions with consultants, evaluation of patient's response to treatment, examination of patient, obtaining history from patient or surrogate, ordering and performing treatments and interventions, ordering and review of laboratory studies, ordering and review of radiographic  studies, pulse oximetry and re-evaluation of patient's condition.    (including critical care time)  Medical Decision Making / ED Course I have reviewed the nursing notes for this encounter and the patient's prior records (if available in EHR or on provided paperwork).   Tamara Guzman was evaluated in Emergency Department on 04/05/2020 for the symptoms described in the history of present illness. She was evaluated in the context of the global COVID-19 pandemic, which necessitated consideration that the patient might be at risk for infection with the SARS-CoV-2 virus that causes COVID-19. Institutional protocols and algorithms that pertain to the evaluation of patients at risk for COVID-19 are in a state of rapid change based on information released by regulatory bodies including the CDC and federal and state organizations. These  policies and algorithms were followed during the patient's care in the ED.    Clinical Course as of Apr 06 807  Sat Apr 05, 2020  0206 Overdose on above medication. Patient is bradycardic but hemodynamically stable.  Coingestion labs obtained.   [PC]  0400 Labs reassuring.  UDS positive for THC. Patient will require 24 hours of observation.  Admitted to stepdown unit for continued monitoring.   [PC]  0410 IVC paperwork filled out   [PC]    Clinical Course User Index [PC] Madasyn Heath, Amadeo Garnet, MD   Mother updated.  Final Clinical Impression(s) / ED Diagnoses Final diagnoses:  Suicide attempt [facility])  Medication overdose, intentional self-harm, initial encounter [facility])      This chart was dictated using voice recognition software.  Despite best efforts to proofread,  errors can occur which can change the documentation meaning.   Nira Conn, MD 04/05/20 [phone]

## 2020-04-05 NOTE — ED Notes (Signed)
Mother remains at bedside. Coffee given and recliner chair provided for comfort. Pt remains alseep. Arousable to gentle shaking. Pt opens eyes, mumbles and closes eyes immediately. Resp wnl, equal and non-labored. Cont to monitor.

## 2020-04-05 NOTE — ED Notes (Signed)
Called poison control narcan(caution due to blood pressure). Hypotension start with fluids. If floods does not work do dopamine. Bradycardia use atropine as needed. Physical stimulation for hypotension. Check Bmp, renal function, liver function, q4h EKG, in four hours tylenol levels, monitor for respiratory depression and hypotension. Observe for 24 hours to make sure that any long acting medication is not a problem.

## 2020-04-06 ENCOUNTER — Other Ambulatory Visit: Payer: Self-pay

## 2020-04-06 DIAGNOSIS — F419 Anxiety disorder, unspecified: Secondary | ICD-10-CM | POA: Diagnosis present

## 2020-04-06 DIAGNOSIS — T43592A Poisoning by other antipsychotics and neuroleptics, intentional self-harm, initial encounter: Secondary | ICD-10-CM | POA: Diagnosis present

## 2020-04-06 DIAGNOSIS — F322 Major depressive disorder, single episode, severe without psychotic features: Secondary | ICD-10-CM | POA: Diagnosis present

## 2020-04-06 DIAGNOSIS — T1491XA Suicide attempt, initial encounter: Secondary | ICD-10-CM

## 2020-04-06 DIAGNOSIS — E669 Obesity, unspecified: Secondary | ICD-10-CM | POA: Diagnosis present

## 2020-04-06 DIAGNOSIS — T50912A Poisoning by multiple unspecified drugs, medicaments and biological substances, intentional self-harm, initial encounter: Secondary | ICD-10-CM

## 2020-04-06 DIAGNOSIS — F909 Attention-deficit hyperactivity disorder, unspecified type: Secondary | ICD-10-CM | POA: Diagnosis present

## 2020-04-06 DIAGNOSIS — T465X2A Poisoning by other antihypertensive drugs, intentional self-harm, initial encounter: Secondary | ICD-10-CM | POA: Diagnosis present

## 2020-04-06 DIAGNOSIS — R451 Restlessness and agitation: Secondary | ICD-10-CM | POA: Diagnosis present

## 2020-04-06 DIAGNOSIS — Z20822 Contact with and (suspected) exposure to covid-19: Secondary | ICD-10-CM | POA: Diagnosis present

## 2020-04-06 DIAGNOSIS — R739 Hyperglycemia, unspecified: Secondary | ICD-10-CM | POA: Diagnosis present

## 2020-04-06 DIAGNOSIS — T368X2A Poisoning by other systemic antibiotics, intentional self-harm, initial encounter: Secondary | ICD-10-CM | POA: Diagnosis present

## 2020-04-06 DIAGNOSIS — Z79899 Other long term (current) drug therapy: Secondary | ICD-10-CM | POA: Diagnosis not present

## 2020-04-06 DIAGNOSIS — T50902A Poisoning by unspecified drugs, medicaments and biological substances, intentional self-harm, initial encounter: Secondary | ICD-10-CM | POA: Diagnosis present

## 2020-04-06 DIAGNOSIS — R001 Bradycardia, unspecified: Secondary | ICD-10-CM | POA: Diagnosis present

## 2020-04-06 LAB — COMPREHENSIVE METABOLIC PANEL
ALT: 10 U/L (ref 0–44)
AST: 24 U/L (ref 15–41)
Albumin: 3.8 g/dL (ref 3.5–5.0)
Alkaline Phosphatase: 85 U/L (ref 38–126)
Anion gap: 12 (ref 5–15)
BUN: 9 mg/dL (ref 6–20)
CO2: 20 mmol/L — ABNORMAL LOW (ref 22–32)
Calcium: 9.3 mg/dL (ref 8.9–10.3)
Chloride: 103 mmol/L (ref 98–111)
Creatinine, Ser: 0.91 mg/dL (ref 0.44–1.00)
GFR calc Af Amer: >60 mL/min (ref >60)
GFR calc non Af Amer: >60 mL/min (ref >60)
Glucose, Bld: 100 mg/dL — ABNORMAL HIGH (ref 70–99)
Potassium: 3.8 mmol/L (ref 3.5–5.1)
Sodium: 135 mmol/L (ref 135–145)
Total Bilirubin: 0.8 mg/dL (ref 0.3–1.2)
Total Protein: 7.3 g/dL (ref 6.5–8.1)

## 2020-04-06 LAB — GLUCOSE, CAPILLARY
Glucose-Capillary: 116 mg/dL — ABNORMAL HIGH (ref 70–99)
Glucose-Capillary: 122 mg/dL — ABNORMAL HIGH (ref 70–99)
Glucose-Capillary: 122 mg/dL — ABNORMAL HIGH (ref 70–99)
Glucose-Capillary: 124 mg/dL — ABNORMAL HIGH (ref 70–99)
Glucose-Capillary: 125 mg/dL — ABNORMAL HIGH (ref 70–99)
Glucose-Capillary: 132 mg/dL — ABNORMAL HIGH (ref 70–99)
Glucose-Capillary: 145 mg/dL — ABNORMAL HIGH (ref 70–99)

## 2020-04-06 LAB — CBC
HCT: 44.9 % (ref 36.0–46.0)
Hemoglobin: 14.2 g/dL (ref 12.0–15.0)
MCH: 25.3 pg — ABNORMAL LOW (ref 26.0–34.0)
MCHC: 31.6 g/dL (ref 30.0–36.0)
MCV: 79.9 fL — ABNORMAL LOW (ref 80.0–100.0)
Platelets: 319 10*3/uL (ref 150–400)
RBC: 5.62 MIL/uL — ABNORMAL HIGH (ref 3.87–5.11)
RDW: 14.7 % (ref 11.5–15.5)
WBC: 9 10*3/uL (ref 4.0–10.5)
nRBC: 0 % (ref 0.0–0.2)

## 2020-04-06 LAB — HIV ANTIBODY (ROUTINE TESTING W REFLEX): HIV Screen 4th Generation wRfx: NONREACTIVE

## 2020-04-06 LAB — PHOSPHORUS: Phosphorus: 4 mg/dL (ref 2.5–4.6)

## 2020-04-06 LAB — MAGNESIUM: Magnesium: 1.7 mg/dL (ref 1.7–2.4)

## 2020-04-06 MED ORDER — HYDROXYZINE HCL 25 MG PO TABS: 25.0000 mg | ORAL_TABLET | Freq: Three times a day (TID) | ORAL | Status: DC | PRN

## 2020-04-06 MED ORDER — SODIUM CHLORIDE 0.9 % IV SOLN: INTRAVENOUS | Status: DC

## 2020-04-06 NOTE — Consult Note (Signed)
Telepsych Consultation   Reason for Consult:  ''overdosed on multiple medications.'' Referring Physician:  Hosie Poisson, MD Location of Patient: [facility] Location of Provider: [facility]  Patient Identification: Tamara Guzman MRN:  [id number] Principal Diagnosis: Drug overdose, multiple drugs, intentional self-harm, initial encounter [facility]) Diagnosis:  Principal Problem:   Drug overdose, multiple drugs, intentional self-harm, initial encounter [facility]) Active Problems:   MDD (major depressive disorder), severe ([place])   Post traumatic stress disorder (PTSD)   Hyperglycemia   Drug overdose, intentional ([place])   Total Time spent with patient: 45 minutes  Subjective:   Tamara Guzman is a 19 y.o. female patient admitted after she overdosed on multiple medications.  HPI:  Patient with history of Depression, anxiety, PTSD,ADHD,  obesity who was admitted after she intentional overdosed on multiple medications.Patient reports that she was stressed out, overwhelmed and difficulty sleeping. As a result, she took 1500 mg of Cipro, 5-6 tablets of 40 mg Latuda, unknown amount of clonidine and possibly other medications so that she can sleep and not wake up. Patient is alert, awake but has been getting easily agitated. She denies homicidal thoughts, psychosis or delusional thinking. Patient is unable to contract for safety.   Past Psychiatric History: as above  Risk to Self: Suicidal Ideation: Yes-Currently Present Suicidal Intent: Yes-Currently Present Is patient at risk for suicide?: Yes Suicidal Plan?: Yes-Currently Present Specify Current Suicidal Plan: Overdose Access to Means: Yes Specify Access to Suicidal Means: Pt had medications What has been your use of drugs/alcohol within the last 12 months?: Denies How many times?: 2 Other Self Harm Risks: (off medications) Triggers for Past Attempts: Unknown Intentional Self Injurious Behavior: None Risk to Others: Homicidal  Ideation: No Thoughts of Harm to Others: No Current Homicidal Intent: No Current Homicidal Plan: No Access to Homicidal Means: No Identified Victim: NA History of harm to others?: No Assessment of Violence: None Noted Violent Behavior Description: NA Does patient have access to weapons?: No Criminal Charges Pending?: No Does patient have a court date: No Prior Inpatient Therapy: Prior Inpatient Therapy: Yes Prior Therapy Dates: 2019 Prior Therapy Facilty/Provider(s): [facility] Reason for Treatment: MH issues Prior Outpatient Therapy: Prior Outpatient Therapy: Yes Prior Therapy Dates: Ongoing Prior Therapy Facilty/Provider(s): [facility] Reason for Treatment: Med mang Does patient have an ACCT team?: No Does patient have Intensive In-House Services?  : No Does patient have [facility] services? : No Does patient have P4CC services?: No  Past Medical History:  Past Medical History:  Diagnosis Date  . ADHD   . Anxiety   . Bipolar 1 disorder ([place])   . Depression   . Obesity    History reviewed. No pertinent surgical history. Family History: No family history on file. Family Psychiatric  History:  Social History:  Social History   Substance and Sexual Activity  Alcohol Use Not Currently     Social History   Substance and Sexual Activity  Drug Use Yes  . Types: Marijuana    Social History   Socioeconomic History  . Marital status: Single    Spouse name: Not on file  . Number of children: Not on file  . Years of education: Not on file  . Highest education level: Not on file  Occupational History  . Not on file  Tobacco Use  . Smoking status: Never Smoker  . Smokeless tobacco: Never Used  Substance and Sexual Activity  . Alcohol use: Not Currently  . Drug use: Yes    Types: Marijuana  .  Sexual activity: Yes    Birth control/protection: None  Other Topics Concern  . Not on file  Social History Narrative  . Not on file   Social Determinants of Health    Financial Resource Strain:   . Difficulty of Paying Living Expenses:   Food Insecurity:   . Worried About Programme researcher, broadcasting/film/video in the Last Year:   . Barista in the Last Year:   Transportation Needs:   . Freight forwarder (Medical):   Marland Kitchen Lack of Transportation (Non-Medical):   Physical Activity:   . Days of Exercise per Week:   . Minutes of Exercise per Session:   Stress:   . Feeling of Stress :   Social Connections:   . Frequency of Communication with Friends and Family:   . Frequency of Social Gatherings with Friends and Family:   . Attends Religious Services:   . Active Member of Clubs or Organizations:   . Attends Banker Meetings:   Marland Kitchen Marital Status:    Additional Social History:    Allergies:   Allergies  Allergen Reactions  . Other Hives and Other (See Comments)    PATIENT DEVELOPS HIVES IF EXPOSED TO ANY TREE NUTS!! Peanut  . Chocolate Hives  . Cocoa Other (See Comments)  . Peanut-Containing Drug Products Hives    Labs:  Results for orders placed or performed during the hospital encounter of 04/05/20 (from the past 48 hour(s))  Comprehensive metabolic panel     Status: Abnormal   Collection Time: 04/05/20  1:49 AM  Result Value Ref Range   Sodium 135 135 - 145 mmol/L   Potassium 4.9 3.5 - 5.1 mmol/L   Chloride 102 98 - 111 mmol/L   CO2 22 22 - 32 mmol/L   Glucose, Bld 235 (H) 70 - 99 mg/dL    Comment: Glucose reference range applies only to samples taken after fasting for at least 8 hours.   BUN 16 6 - 20 mg/dL   Creatinine, Ser 0.45 0.44 - 1.00 mg/dL   Calcium 9.3 8.9 - 40.9 mg/dL   Total Protein 8.2 (H) 6.5 - 8.1 g/dL   Albumin 4.4 3.5 - 5.0 g/dL   AST 22 15 - 41 U/L   ALT 12 0 - 44 U/L   Alkaline Phosphatase 90 38 - 126 U/L   Total Bilirubin 0.6 0.3 - 1.2 mg/dL   GFR calc non Af Amer >60 >60 mL/min   GFR calc Af Amer >60 >60 mL/min   Anion gap 11 5 - 15    Comment: Performed at [facility], [address], [place], [place] [address]  Salicylate level     Status: Abnormal   Collection Time: 04/05/20  1:49 AM  Result Value Ref Range   Salicylate Lvl <7.0 (L) 7.0 - 30.0 mg/dL    Comment: Performed at [facility], [address], [place], [place] [address]  Acetaminophen level     Status: Abnormal   Collection Time: 04/05/20  1:49 AM  Result Value Ref Range   Acetaminophen (Tylenol), Serum <10 (L) 10 - 30 ug/mL    Comment: (NOTE) Therapeutic concentrations vary significantly. A range of 10-30 ug/mL  may be an effective concentration for many patients. However, some  are best treated at concentrations outside of this range. Acetaminophen concentrations >150 ug/mL at 4 hours after ingestion  and >50 ug/mL at 12 hours after ingestion are often associated with  toxic reactions. Performed  at [facility], [address] [place], [place] [address]   Ethanol     Status: None   Collection Time: 04/05/20  1:49 AM  Result Value Ref Range   Alcohol, Ethyl (B) <10 <10 mg/dL    Comment: (NOTE) Lowest detectable limit for serum alcohol is 10 mg/dL. For medical purposes only. Performed at [facility], [address] [place], [place] [address]   Urine rapid drug screen (hosp performed)     Status: Abnormal   Collection Time: 04/05/20  1:49 AM  Result Value Ref Range   Opiates NONE DETECTED NONE DETECTED   Cocaine NONE DETECTED NONE DETECTED   Benzodiazepines NONE DETECTED NONE DETECTED   Amphetamines NONE DETECTED NONE DETECTED   Tetrahydrocannabinol POSITIVE (A) NONE DETECTED   Barbiturates NONE DETECTED NONE DETECTED    Comment: (NOTE) DRUG SCREEN FOR MEDICAL PURPOSES ONLY.  IF CONFIRMATION IS NEEDED FOR ANY PURPOSE, NOTIFY LAB WITHIN 5 DAYS. LOWEST DETECTABLE LIMITS FOR URINE DRUG SCREEN Drug Class                     Cutoff (ng/mL) Amphetamine and metabolites    1000 Barbiturate and metabolites    200 Benzodiazepine                  200 Tricyclics and metabolites     300 Opiates and metabolites        300 Cocaine and metabolites        300 THC                            50 Performed at [facility], [address] [place], [place] [address]   CBC WITH DIFFERENTIAL     Status: Abnormal   Collection Time: 04/05/20  1:49 AM  Result Value Ref Range   WBC 10.3 4.0 - 10.5 K/uL   RBC 5.48 (H) 3.87 - 5.11 MIL/uL   Hemoglobin 13.7 12.0 - 15.0 g/dL   HCT 91.444.9 78.236.0 - 95.646.0 %   MCV 81.9 80.0 - 100.0 fL   MCH 25.0 (L) 26.0 - 34.0 pg   MCHC 30.5 30.0 - 36.0 g/dL   RDW 21.314.6 08.611.5 - 57.815.5 %   Platelets 353 150 - 400 K/uL   nRBC 0.0 0.0 - 0.2 %   Neutrophils Relative % 71 %   Neutro Abs 7.4 1.7 - 7.7 K/uL   Lymphocytes Relative 21 %   Lymphs Abs 2.1 0.7 - 4.0 K/uL   Monocytes Relative 6 %   Monocytes Absolute 0.6 0.1 - 1.0 K/uL   Eosinophils Relative 1 %   Eosinophils Absolute 0.1 0.0 - 0.5 K/uL   Basophils Relative 1 %   Basophils Absolute 0.1 0.0 - 0.1 K/uL   Immature Granulocytes 0 %   Abs Immature Granulocytes 0.03 0.00 - 0.07 K/uL    Comment: Performed at [facility], [address] [place], [place] [address]  I-Stat beta hCG blood, ED     Status: None   Collection Time: 04/05/20  3:04 AM  Result Value Ref Range   I-stat hCG, quantitative <5.0 <5 mIU/mL   Comment 3            Comment:   GEST. AGE      CONC.  (mIU/mL)   <=1 WEEK        5 - 50     2 WEEKS  50 - 500     3 WEEKS       100 - 10,000     4 WEEKS     1,000 - 30,000        FEMALE AND NON-PREGNANT FEMALE:     LESS THAN 5 mIU/mL   I-Stat Chem 8, ED     Status: Abnormal   Collection Time: 04/05/20  3:06 AM  Result Value Ref Range   Sodium 135 135 - 145 mmol/L   Potassium 4.8 3.5 - 5.1 mmol/L   Chloride 102 98 - 111 mmol/L   BUN 15 6 - 20 mg/dL   Creatinine, Ser 6.38 0.44 - 1.00 mg/dL   Glucose, Bld 466 (H) 70 - 99 mg/dL    Comment: Glucose reference range applies only to samples taken after  fasting for at least 8 hours.   Calcium, Ion 1.23 1.15 - 1.40 mmol/L   TCO2 26 22 - 32 mmol/L   Hemoglobin 15.3 (H) 12.0 - 15.0 g/dL   HCT 59.9 35.7 - 01.7 %  Respiratory Panel by RT PCR (Flu A&B, Covid) - Nasopharyngeal Swab     Status: None   Collection Time: 04/05/20  5:12 AM   Specimen: Nasopharyngeal Swab  Result Value Ref Range   SARS Coronavirus 2 by RT PCR NEGATIVE NEGATIVE    Comment: (NOTE) SARS-CoV-2 target nucleic acids are NOT DETECTED. The SARS-CoV-2 RNA is generally detectable in upper respiratoy specimens during the acute phase of infection. The lowest concentration of SARS-CoV-2 viral copies this assay can detect is 131 copies/mL. A negative result does not preclude SARS-Cov-2 infection and should not be used as the sole basis for treatment or other patient management decisions. A negative result may occur with  improper specimen collection/handling, submission of specimen other than nasopharyngeal swab, presence of viral mutation(s) within the areas targeted by this assay, and inadequate number of viral copies (<131 copies/mL). A negative result must be combined with clinical observations, patient history, and epidemiological information. The expected result is Negative. Fact Sheet for Patients:  https://www.moore.com/ Fact Sheet for Healthcare Providers:  https://www.young.biz/ This test is not yet ap proved or cleared by the [place] FDA and  has been authorized for detection and/or diagnosis of SARS-CoV-2 by FDA under an Emergency Use Authorization (EUA). This EUA will remain  in effect (meaning this test can be used) for the duration of the COVID-19 declaration under Section 564(b)(1) of the Act, 21 U.S.C. section 360bbb-3(b)(1), unless the authorization is terminated or revoked sooner.    Influenza A by PCR NEGATIVE NEGATIVE   Influenza B by PCR NEGATIVE NEGATIVE    Comment: (NOTE) The Xpert Xpress  SARS-CoV-2/FLU/RSV assay is intended as an aid in  the diagnosis of influenza from Nasopharyngeal swab specimens and  should not be used as a sole basis for treatment. Nasal washings and  aspirates are unacceptable for Xpert Xpress SARS-CoV-2/FLU/RSV  testing. Fact Sheet for Patients: https://www.moore.com/ Fact Sheet for Healthcare Providers: https://www.young.biz/ This test is not yet approved or cleared by the [place] FDA and  has been authorized for detection and/or diagnosis of SARS-CoV-2 by  FDA under an Emergency Use Authorization (EUA). This EUA will remain  in effect (meaning this test can be used) for the duration of the  Covid-19 declaration under Section 564(b)(1) of the Act, 21  U.S.C. section 360bbb-3(b)(1), unless the authorization is  terminated or revoked. Performed at [facility], [address], [place], [place] [address]   Hemoglobin A1c  Status: Abnormal   Collection Time: 04/05/20  5:16 AM  Result Value Ref Range   Hgb A1c MFr Bld 5.7 (H) 4.8 - 5.6 %    Comment: (NOTE) Pre diabetes:          5.7%-6.4% Diabetes:              >6.4% Glycemic control for   <7.0% adults with diabetes    Mean Plasma Glucose 116.89 mg/dL    Comment: Performed at [facility], [address], [place], [place] [address]  CBG monitoring, ED     Status: Abnormal   Collection Time: 04/05/20  7:41 AM  Result Value Ref Range   Glucose-Capillary 128 (H) 70 - 99 mg/dL    Comment: Glucose reference range applies only to samples taken after fasting for at least 8 hours.  Acetaminophen level     Status: Abnormal   Collection Time: 04/05/20  8:35 AM  Result Value Ref Range   Acetaminophen (Tylenol), Serum <10 (L) 10 - 30 ug/mL    Comment: (NOTE) Therapeutic concentrations vary significantly. A range of 10-30 ug/mL  may be an effective concentration for many patients. However, some  are best treated at concentrations  outside of this range. Acetaminophen concentrations >150 ug/mL at 4 hours after ingestion  and >50 ug/mL at 12 hours after ingestion are often associated with  toxic reactions. Performed at [facility], [address] [place], [place] [address]   Basic metabolic panel     Status: Abnormal   Collection Time: 04/05/20  8:35 AM  Result Value Ref Range   Sodium 138 135 - 145 mmol/L   Potassium 4.2 3.5 - 5.1 mmol/L   Chloride 108 98 - 111 mmol/L   CO2 22 22 - 32 mmol/L   Glucose, Bld 130 (H) 70 - 99 mg/dL    Comment: Glucose reference range applies only to samples taken after fasting for at least 8 hours.   BUN 12 6 - 20 mg/dL   Creatinine, Ser 3.08 0.44 - 1.00 mg/dL   Calcium 8.6 (L) 8.9 - 10.3 mg/dL   GFR calc non Af Amer >60 >60 mL/min   GFR calc Af Amer >60 >60 mL/min   Anion gap 8 5 - 15    Comment: Performed at [facility], [address], [place], [place] [address]  CBG monitoring, ED     Status: Abnormal   Collection Time: 04/05/20 12:30 PM  Result Value Ref Range   Glucose-Capillary 121 (H) 70 - 99 mg/dL    Comment: Glucose reference range applies only to samples taken after fasting for at least 8 hours.  Glucose, capillary     Status: Abnormal   Collection Time: 04/05/20  4:18 PM  Result Value Ref Range   Glucose-Capillary 126 (H) 70 - 99 mg/dL    Comment: Glucose reference range applies only to samples taken after fasting for at least 8 hours.   Comment 1 Notify RN    Comment 2 Document in Chart   MRSA PCR Screening     Status: None   Collection Time: 04/05/20  4:25 PM   Specimen: Nasopharyngeal  Result Value Ref Range   MRSA by PCR NEGATIVE NEGATIVE    Comment:        The GeneXpert MRSA Assay (FDA approved for NASAL specimens only), is one component of a comprehensive MRSA colonization surveillance program. It is not intended to diagnose MRSA infection nor to guide or monitor treatment  for MRSA infections. Performed at  [facility], [address] [place], [place] [address]   Glucose, capillary     Status: Abnormal   Collection Time: 04/05/20  8:29 PM  Result Value Ref Range   Glucose-Capillary 129 (H) 70 - 99 mg/dL    Comment: Glucose reference range applies only to samples taken after fasting for at least 8 hours.  Glucose, capillary     Status: Abnormal   Collection Time: 04/06/20 12:04 AM  Result Value Ref Range   Glucose-Capillary 125 (H) 70 - 99 mg/dL    Comment: Glucose reference range applies only to samples taken after fasting for at least 8 hours.  Glucose, capillary     Status: Abnormal   Collection Time: 04/06/20  3:55 AM  Result Value Ref Range   Glucose-Capillary 145 (H) 70 - 99 mg/dL    Comment: Glucose reference range applies only to samples taken after fasting for at least 8 hours.   Comment 1 Notify RN    Comment 2 Document in Chart   HIV Antibody (routine testing w rflx)     Status: None   Collection Time: 04/06/20  6:38 AM  Result Value Ref Range   HIV Screen 4th Generation wRfx NON REACTIVE NON REACTIVE    Comment: Performed at [facility], [address] [place], [place] [address]  CBC     Status: Abnormal   Collection Time: 04/06/20  6:38 AM  Result Value Ref Range   WBC 9.0 4.0 - 10.5 K/uL   RBC 5.62 (H) 3.87 - 5.11 MIL/uL   Hemoglobin 14.2 12.0 - 15.0 g/dL   HCT 81.1 57.2 - 62.0 %   MCV 79.9 (L) 80.0 - 100.0 fL   MCH 25.3 (L) 26.0 - 34.0 pg   MCHC 31.6 30.0 - 36.0 g/dL   RDW 35.5 97.4 - 16.3 %   Platelets 319 150 - 400 K/uL   nRBC 0.0 0.0 - 0.2 %    Comment: Performed at [facility], [address], [place], [place] [address]  Comprehensive metabolic panel     Status: Abnormal   Collection Time: 04/06/20  6:38 AM  Result Value Ref Range   Sodium 135 135 - 145 mmol/L   Potassium 3.8 3.5 - 5.1 mmol/L   Chloride 103 98 - 111 mmol/L   CO2 20 (L) 22 - 32 mmol/L   Glucose, Bld 100 (H) 70 - 99 mg/dL    Comment: Glucose  reference range applies only to samples taken after fasting for at least 8 hours.   BUN 9 6 - 20 mg/dL   Creatinine, Ser 4.68 0.44 - 1.00 mg/dL   Calcium 9.3 8.9 - 03.2 mg/dL   Total Protein 7.3 6.5 - 8.1 g/dL   Albumin 3.8 3.5 - 5.0 g/dL   AST 24 15 - 41 U/L   ALT 10 0 - 44 U/L   Alkaline Phosphatase 85 38 - 126 U/L   Total Bilirubin 0.8 0.3 - 1.2 mg/dL   GFR calc non Af Amer >60 >60 mL/min   GFR calc Af Amer >60 >60 mL/min   Anion gap 12 5 - 15    Comment: Performed at [facility], [address] [place], [place] [address]  Magnesium     Status: None   Collection Time: 04/06/20  6:38 AM  Result Value Ref Range   Magnesium 1.7 1.7 - 2.4 mg/dL    Comment: Performed at [facility], [address], [place], [place] [address]  Phosphorus     Status: None   Collection Time: 04/06/20  6:38 AM  Result Value Ref Range   Phosphorus 4.0 2.5 - 4.6 mg/dL    Comment: Performed at [facility], [address] [place], [place] [address]  Glucose, capillary     Status: Abnormal   Collection Time: 04/06/20  7:42 AM  Result Value Ref Range   Glucose-Capillary 122 (H) 70 - 99 mg/dL    Comment: Glucose reference range applies only to samples taken after fasting for at least 8 hours.  Glucose, capillary     Status: Abnormal   Collection Time: 04/06/20 12:29 PM  Result Value Ref Range   Glucose-Capillary 122 (H) 70 - 99 mg/dL    Comment: Glucose reference range applies only to samples taken after fasting for at least 8 hours.   Comment 1 Notify RN    Comment 2 Document in Chart     Medications:  Current Facility-Administered Medications  Medication Dose Route Frequency Provider Last Rate Last Admin  . 0.9 %  sodium chloride infusion   Intravenous Continuous Kathlen Mody, MD 75 mL/hr at 04/06/20 1008 Rate Verify at 04/06/20 1008  . Chlorhexidine Gluconate Cloth 2 % PADS 6 each  6 each Topical Daily Kathlen Mody, MD   6 each at  04/05/20 1625  . enoxaparin (LOVENOX) injection 40 mg  40 mg Subcutaneous Q24H Kathlen Mody, MD   40 mg at 04/06/20 0945  . hydrALAZINE (APRESOLINE) injection 5 mg  5 mg Intravenous Q6H PRN Kathlen Mody, MD   5 mg at 04/05/20 1728  . insulin aspart (novoLOG) injection 0-9 Units  0-9 Units Subcutaneous Q4H Kathlen Mody, MD   1 Units at 04/06/20 0819  . sodium chloride flush (NS) 0.9 % injection 3 mL  3 mL Intravenous Q12H Kathlen Mody, MD   3 mL at 04/06/20 0950    Musculoskeletal: Strength & Muscle Tone: not tested, pt assessed via tele health Gait & Station: not tested, pt assessed via tele health Patient leans: N/A  Psychiatric Specialty Exam: Physical Exam  Psychiatric: Her speech is rapid and/or pressured. She is agitated and aggressive. Cognition and memory are normal. She expresses impulsivity. She exhibits a depressed mood. She expresses suicidal ideation. She expresses suicidal plans.    Review of Systems  Constitutional: Negative.   HENT: Negative.   Eyes: Negative.   Respiratory: Negative.   Cardiovascular: Negative.   Gastrointestinal: Negative.   Endocrine: Negative.   Musculoskeletal: Negative.   Skin: Negative.   Neurological: Negative.   Hematological: Negative.   Psychiatric/Behavioral: Positive for agitation, behavioral problems and suicidal ideas.    Blood pressure 113/76, pulse (!) 45, temperature 97.6 F (36.4 C), temperature source Oral, resp. rate 18, height  (1.575 m), weight 105.6 kg, SpO2 100 %.Body mass index is 42.58 kg/m.  General Appearance: Casual  Eye Contact:  Good  Speech:  Pressured  Volume:  Increased  Mood:  Irritable  Affect:  Labile  Thought Process:  Coherent  Orientation:  Full (Time, Place, and Person)  Thought Content:  Logical  Suicidal Thoughts:  Yes.  with intent/plan  Homicidal Thoughts:  No  Memory:  Immediate;   Fair Recent;   Fair Remote;   Good  Judgement:  Poor  Insight:  Lacking  Psychomotor Activity:   Increased  Concentration:  Concentration: Fair and Attention Span: Fair  Recall:  [employer] of Knowledge:  Fair  Language:  Good  Akathisia:  No  Handed:  Right  AIMS (if indicated):     Assets:  Communication Skills Desire for Improvement Social Support  ADL's:  Intact  Cognition:  WNL  Sleep:   poor     Treatment Plan Summary: 19 year old female who was admitted after she intentionally overdosed on multiple medications. Currently, patient is unable to contract for safety and will benefit from inpatient psychiatric admission after she is medically stable.  Recommendations: -Hydroxyzine 25 mg po q8h prn for anxiety/agitation. -Consider social worker consult to facilitate inpatient psychiatric hospital placement. -Re-start Seroquel 25 mg at bedtime when patient is fully alert, awake and oriented.  Disposition: Recommend psychiatric Inpatient admission when medically cleared. Supportive therapy provided about ongoing stressors. Psychiatric service signing out. Re-consult as needed  This service was provided via telemedicine using a 2-way, interactive audio and video technology.  Names of all persons participating in this telemedicine service and their role in this encounter. Name: Myeshia Fojtik Role: Patient  Name: Niel Hummer Role: RN  Name: Thedore Mins, MD  Role: Psychiatrist    Thedore Mins, MD 04/06/2020 2:55 PM

## 2020-04-06 NOTE — Progress Notes (Signed)
MD aware of YELLOW MEWS. Pt on telemetry. Pt sitting up eating. HR 76 R22. Alert when eating. Watching TV. Sleeping between eating and BR. Sitter and mother at bedside. Monitoring continues.

## 2020-04-06 NOTE — Progress Notes (Signed)
Pt has arrived to Room 1504 from ICU. Pt alert to voice. Mom at bedside. Pt has arrived in YELLOW MEWS r/t HR and BP. Will alert MD via text. This is not a new finding, as pt has been bradycardic since admission. Will closely monitor. Sitter at bedside.

## 2020-04-06 NOTE — Progress Notes (Signed)
Pt GREEN MEWS. Alert. Eating. Asking to go home to take the ACT test tomorrow at school. Had Psych virtual visit today with the tablet. Sitter at bedside.

## 2020-04-06 NOTE — Progress Notes (Signed)
PROGRESS NOTE    Tamara Guzman  YNW:[id number] DOB: 01-Aug-2001 DOA: 04/05/2020 PCP: Patient, No Pcp Per   Chief Complaint  Patient presents with  . Suicide Attempt    Brief Narrative: 19 year old lady with prior h/o depression, anxiety, obesity was brought in for intentional overdose with multiple medications. Patient has reportedly been in and out of behavioral health hospital for several years per report of her mother, has recently been complaining of stress related to school, and then yelled out for her mother overnight and was found to be on the phone with 911 and had various pills on the floor. Patient reportedly took 1500 mg of Cipro, 1600 mg Latuda, unknown amount of clonidine, and possibly other medications. She was admitted to the hospital ist service for observation.   Assessment & Plan:   Principal Problem:   Drug overdose, multiple drugs, intentional self-harm, initial encounter ([place]) Active Problems:   MDD (major depressive disorder), severe ([place])   Hyperglycemia  Drug Overdose:  Intentional,  reports she took it as she was feeling sad, hence she took it.  Does not want to go to [facility], but mom at bedside convinced her.  She reports feeling dizzy on getting out of bed. Will get orthostatic vital signs.     Bradycardia:  Sinus bradycardia. Repeat EKG this afternoon. No palpitations.     Hyperglycemia:  A1c is 5.7.     DVT prophylaxis: Lovenox.  Code Status: full code.  Family Communication: mom at bedside.  Disposition:   Status is: Observation  The patient remains OBS appropriate and will d/c before 2 midnights.  Dispo: The patient is from: Home              Anticipated d/c is to: Behavioral health.               Anticipated d/c date is: 1 day              Patient currently is not medically stable to d/c.       Consultants:   Psychiatry.    Procedures: None.   Antimicrobials: none.   Subjective: Says she does not want to go to [facility],  she reports feeling dizziness when she gets up to go to the bathroom.  No chest pain or sob.  No nausea, vomiting or abdominal pain.   Objective: Vitals:   04/06/20 0000 04/06/20 0400 04/06/20 0507 04/06/20 0816  BP: (!) 172/85  (!) 130/91   Pulse:   (!) 43   Resp:   19   Temp:  97.8 F (36.6 C)  98.5 F (36.9 C)  TempSrc:  Oral  Oral  SpO2:   98%   Weight:      Height:        Intake/Output Summary (Last 24 hours) at 04/06/2020 0921 Last data filed at 04/05/2020 1624 Gross per 24 hour  Intake 250 ml  Output --  Net 250 ml   Filed Weights   04/05/20 0126 04/05/20 1625  Weight: 105.7 kg 105.6 kg    Examination:  General exam: Appears calm and comfortable  Respiratory system: Clear to auscultation. Respiratory effort normal. Cardiovascular system: S1 & S2 heard, RRR. No JVD. No pedal edema. Gastrointestinal system: Abdomen is nondistended, soft and nontender.  Normal bowel sounds heard. Central nervous system: Alert and oriented. No focal neurological deficits. Extremities: no pedal edema.  Skin: No rashes, lesions or ulcers Psychiatry: restless.     Data Reviewed: I have personally reviewed following  labs and imaging studies  CBC: Recent Labs  Lab 04/05/20 0149 04/05/20 0306 04/06/20 0638  WBC 10.3  --  9.0  NEUTROABS 7.4  --   --   HGB 13.7 15.3* 14.2  HCT 44.9 45.0 44.9  MCV 81.9  --  79.9*  PLT 353  --  319    Basic Metabolic Panel: Recent Labs  Lab 04/05/20 0149 04/05/20 0306 04/05/20 0835 04/06/20 0638  NA 135 135 138 135  K 4.9 4.8 4.2 3.8  CL 102 102 108 103  CO2 22  --  22 20*  GLUCOSE 235* 233* 130* 100*  BUN 16 15 12 9   CREATININE 0.90 0.80 0.73 0.91  CALCIUM 9.3  --  8.6* 9.3    GFR: Estimated Creatinine Clearance: 114.4 mL/min (by C-G formula based on SCr of 0.91 mg/dL).  Liver Function Tests: Recent Labs  Lab 04/05/20 0149 04/06/20 0638  AST 22 24  ALT 12 10  ALKPHOS 90 85  BILITOT 0.6 0.8  PROT 8.2* 7.3  ALBUMIN 4.4  3.8    CBG: Recent Labs  Lab 04/05/20 1618 04/05/20 2029 04/06/20 0004 04/06/20 0355 04/06/20 0742  GLUCAP 126* 129* 125* 145* 122*     Recent Results (from the past 240 hour(s))  Respiratory Panel by RT PCR (Flu A&B, Covid) - Nasopharyngeal Swab     Status: None   Collection Time: 04/05/20  5:12 AM   Specimen: Nasopharyngeal Swab  Result Value Ref Range Status   SARS Coronavirus 2 by RT PCR NEGATIVE NEGATIVE Final    Comment: (NOTE) SARS-CoV-2 target nucleic acids are NOT DETECTED. The SARS-CoV-2 RNA is generally detectable in upper respiratoy specimens during the acute phase of infection. The lowest concentration of SARS-CoV-2 viral copies this assay can detect is 131 copies/mL. A negative result does not preclude SARS-Cov-2 infection and should not be used as the sole basis for treatment or other patient management decisions. A negative result may occur with  improper specimen collection/handling, submission of specimen other than nasopharyngeal swab, presence of viral mutation(s) within the areas targeted by this assay, and inadequate number of viral copies (<131 copies/mL). A negative result must be combined with clinical observations, patient history, and epidemiological information. The expected result is Negative. Fact Sheet for Patients:  04/07/20 Fact Sheet for Healthcare Providers:  https://www.moore.com/ This test is not yet ap proved or cleared by the https://www.young.biz/ FDA and  has been authorized for detection and/or diagnosis of SARS-CoV-2 by FDA under an Emergency Use Authorization (EUA). This EUA will remain  in effect (meaning this test can be used) for the duration of the COVID-19 declaration under Section 564(b)(1) of the Act, 21 U.S.C. section 360bbb-3(b)(1), unless the authorization is terminated or revoked sooner.    Influenza A by PCR NEGATIVE NEGATIVE Final   Influenza B by PCR NEGATIVE NEGATIVE  Final    Comment: (NOTE) The Xpert Xpress SARS-CoV-2/FLU/RSV assay is intended as an aid in  the diagnosis of influenza from Nasopharyngeal swab specimens and  should not be used as a sole basis for treatment. Nasal washings and  aspirates are unacceptable for Xpert Xpress SARS-CoV-2/FLU/RSV  testing. Fact Sheet for Patients: [place] Fact Sheet for Healthcare Providers: https://www.moore.com/ This test is not yet approved or cleared by the https://www.young.biz/ FDA and  has been authorized for detection and/or diagnosis of SARS-CoV-2 by  FDA under an Emergency Use Authorization (EUA). This EUA will remain  in effect (meaning this test can be used) for the duration  of the  Covid-19 declaration under Section 564(b)(1) of the Act, 21  U.S.C. section 360bbb-3(b)(1), unless the authorization is  terminated or revoked. Performed at [facility], [address] [place], [place] [address]   MRSA PCR Screening     Status: None   Collection Time: 04/05/20  4:25 PM   Specimen: Nasopharyngeal  Result Value Ref Range Status   MRSA by PCR NEGATIVE NEGATIVE Final    Comment:        The GeneXpert MRSA Assay (FDA approved for NASAL specimens only), is one component of a comprehensive MRSA colonization surveillance program. It is not intended to diagnose MRSA infection nor to guide or monitor treatment for MRSA infections. Performed at [facility], [address], [place], [place] [address]          Radiology Studies: No results found.      Scheduled Meds: . Chlorhexidine Gluconate Cloth  6 each Topical Daily  . enoxaparin (LOVENOX) injection  40 mg Subcutaneous Q24H  . insulin aspart  0-9 Units Subcutaneous Q4H  . sodium chloride flush  3 mL Intravenous Q12H   Continuous Infusions:   LOS: 0 days       Kathlen Mody, MD [facility]   To contact the attending provider between  7A-7P or the covering provider during after hours 7P-7A, please log into the web site [email or web address] and access using universal [place] password for that web site. If you do not have the password, please call the hospital operator.  04/06/2020, 9:21 AM

## 2020-04-07 ENCOUNTER — Encounter (HOSPITAL_COMMUNITY): Payer: Self-pay | Admitting: Registered Nurse

## 2020-04-07 ENCOUNTER — Other Ambulatory Visit: Payer: Self-pay

## 2020-04-07 ENCOUNTER — Inpatient Hospital Stay (HOSPITAL_COMMUNITY)
Admission: AD | Admit: 2020-04-07 | Discharge: 2020-04-13 | DRG: 885 | Disposition: A | Discharge status: Home / Self Care | Payer: Medicaid Other | Attending: Psychiatry | Admitting: Psychiatry

## 2020-04-07 DIAGNOSIS — Z91018 Allergy to other foods: Secondary | ICD-10-CM | POA: Diagnosis not present

## 2020-04-07 DIAGNOSIS — Z91010 Allergy to peanuts: Secondary | ICD-10-CM | POA: Diagnosis not present

## 2020-04-07 DIAGNOSIS — G4700 Insomnia, unspecified: Secondary | ICD-10-CM | POA: Diagnosis present

## 2020-04-07 DIAGNOSIS — T1491XA Suicide attempt, initial encounter: Secondary | ICD-10-CM | POA: Diagnosis not present

## 2020-04-07 DIAGNOSIS — T50912A Poisoning by multiple unspecified drugs, medicaments and biological substances, intentional self-harm, initial encounter: Secondary | ICD-10-CM | POA: Diagnosis not present

## 2020-04-07 DIAGNOSIS — T465X2D Poisoning by other antihypertensive drugs, intentional self-harm, subsequent encounter: Secondary | ICD-10-CM

## 2020-04-07 DIAGNOSIS — Z9114 Patient's other noncompliance with medication regimen: Secondary | ICD-10-CM

## 2020-04-07 DIAGNOSIS — F322 Major depressive disorder, single episode, severe without psychotic features: Secondary | ICD-10-CM | POA: Diagnosis not present

## 2020-04-07 DIAGNOSIS — F909 Attention-deficit hyperactivity disorder, unspecified type: Secondary | ICD-10-CM | POA: Diagnosis present

## 2020-04-07 DIAGNOSIS — R739 Hyperglycemia, unspecified: Secondary | ICD-10-CM | POA: Diagnosis not present

## 2020-04-07 DIAGNOSIS — F3130 Bipolar disorder, current episode depressed, mild or moderate severity, unspecified: Secondary | ICD-10-CM | POA: Diagnosis present

## 2020-04-07 DIAGNOSIS — F4310 Post-traumatic stress disorder, unspecified: Secondary | ICD-10-CM | POA: Diagnosis present

## 2020-04-07 LAB — GLUCOSE, CAPILLARY
Glucose-Capillary: 114 mg/dL — ABNORMAL HIGH (ref 70–99)
Glucose-Capillary: 148 mg/dL — ABNORMAL HIGH (ref 70–99)
Glucose-Capillary: 123 mg/dL — ABNORMAL HIGH (ref 70–99)
Glucose-Capillary: 144 mg/dL — ABNORMAL HIGH (ref 70–99)

## 2020-04-07 MED ORDER — INSULIN ASPART 100 UNIT/ML ~~LOC~~ SOLN
0.0000 [IU] | Freq: Three times a day (TID) | SUBCUTANEOUS | Status: DC
  Filled 2020-04-07 (×27): qty 0.06

## 2020-04-07 MED ORDER — ENOXAPARIN SODIUM 60 MG/0.6ML ~~LOC~~ SOLN
0.5000 mg/kg | SUBCUTANEOUS | Status: DC
  Filled 2020-04-07 (×2): qty 0.6

## 2020-04-07 MED ORDER — INSULIN ASPART 100 UNIT/ML ~~LOC~~ SOLN
0.0000 [IU] | SUBCUTANEOUS | Status: DC
Start: 2020-04-08 — End: 2020-04-07

## 2020-04-07 MED ORDER — HYDROXYZINE HCL 25 MG PO TABS: 25.0000 mg | ORAL_TABLET | Freq: Three times a day (TID) | ORAL | Status: DC | PRN

## 2020-04-07 MED ORDER — HYDROXYZINE HCL 25 MG PO TABS: 25.0000 mg | ORAL_TABLET | Freq: Three times a day (TID) | ORAL | 0 refills | Status: DC | PRN

## 2020-04-07 NOTE — Discharge Summary (Signed)
Physician Discharge Summary  Tamara Guzman SFK:[id number] DOB: 12/10/2001 DOA: 04/05/2020  PCP: Patient, No Pcp Per  Admit date: 04/05/2020 Discharge date: 04/07/2020  Admitted From: Home.  Disposition:  BHC.   Recommendations for Outpatient Follow-up:  1. Follow up with PCP in 1-2 weeks 2. Please obtain BMP/CBC in one week Please follow up with psychiatry at [facility] as recommended.   Discharge Condition: Stable.  CODE STATUS: FULL CODE.  Diet recommendation: Heart Healthy    Brief/Interim Summary: 19 year old lady with prior h/o depression, anxiety, obesity was brought in for intentional overdose with multiple medications.Patient has reportedly been in and out of behavioral health hospital for several years per report of her mother, has recently been complaining of stress related to school, and then yelled out for her mother overnight and was found to be on the phone with 911 and had various pills on the floor. Patient reportedly took 1500 mg of Cipro, 1600 mg Latuda, unknown amount of clonidine, and possibly other medications. She was admitted to the hospital ist service for observation.  Patient is medically stable for discharge to [facility].   Discharge Diagnoses:  Principal Problem:   Drug overdose, multiple drugs, intentional self-harm, initial encounter ([facility]) Active Problems:   MDD (major depressive disorder), severe ([facility])   Post traumatic stress disorder (PTSD)   Hyperglycemia   Drug overdose, intentional ([facility])  Drug Overdose:  Intentional,  reports she took it as she was feeling sad, hence she took it.  psychiatry consulted and recommended inpatient psychiatry hospitalization.  No new complaints.      Bradycardia:  Sinus bradycardia. No palpitations, asymptomatic.     Hyperglycemia:  A1c is 5.7.  Discharge Instructions   Allergies as of 04/07/2020      Reactions   Other Hives, Other (See Comments)   PATIENT DEVELOPS HIVES IF EXPOSED TO ANY TREE  NUTS!! Peanut   Chocolate Hives   Cocoa Other (See Comments)   Peanut-containing Drug Products Hives      Medication List    STOP taking these medications   ciprofloxacin 500 MG tablet Commonly known as: CIPRO   cloNIDine 0.2 MG tablet Commonly known as: CATAPRES   Latuda 60 MG Tabs Generic drug: Lurasidone HCl   lurasidone 40 MG Tabs tablet Commonly known as: LATUDA   methylphenidate 54 MG CR tablet Commonly known as: CONCERTA   ondansetron 4 MG tablet Commonly known as: ZOFRAN   polyethylene glycol 17 g packet Commonly known as: MiraLax   QUEtiapine 25 MG tablet Commonly known as: SEROQUEL   traMADol-acetaminophen 37.5-325 MG tablet Commonly known as: Ultracet     TAKE these medications   hydrOXYzine 25 MG tablet Commonly known as: ATARAX/VISTARIL Take 1 tablet (25 mg total) by mouth 3 (three) times daily as needed for anxiety (agitation).       Allergies  Allergen Reactions  . Other Hives and Other (See Comments)    PATIENT DEVELOPS HIVES IF EXPOSED TO ANY TREE NUTS!! Peanut  . Chocolate Hives  . Cocoa Other (See Comments)  . Peanut-Containing Drug Products Hives    Consultations:  Psychiatry.    Procedures/Studies: DG Abd 1 View  Result Date: 03/31/2020 CLINICAL DATA:  19 year old female with abdominal pain for over a week, reports last normal bowel movement was a month ago. EXAM: ABDOMEN - 1 VIEW COMPARISON:  Abdominal radiographs 10/06/2019. FINDINGS: Supine views of the abdomen and pelvis. Paucity of bowel gas, nonobstructed pattern. No significant volume of retained stool identified. Abdominal and pelvic  visceral contours are within normal limits. No osseous abnormality identified. IMPRESSION: Paucity of bowel gas with no evidence of bowel obstruction or significant retain stool. Electronically Signed   By: Odessa Fleming M.D.   On: 03/31/2020 10:42       Subjective: No new complaints.   Discharge Exam: Vitals:   04/07/20 0402 04/07/20 0410   BP: 100/62 98/62  Pulse: (!) 50 66  Resp: 18 20  Temp: 98.8 F (37.1 C) 98.5 F (36.9 C)  SpO2: 100%    Vitals:   04/06/20 1946 04/06/20 2345 04/07/20 0402 04/07/20 0410  BP: (!) 104/58 (!) 105/59 100/62 98/62  Pulse: 68 (!) 55 (!) 50 66  Resp: 18 17 18 20   Temp: 98.5 F (36.9 C) 98.9 F (37.2 C) 98.8 F (37.1 C) 98.5 F (36.9 C)  TempSrc: Oral     SpO2: 100% 100% 100%   Weight:      Height:        General: Pt is alert, awake, not in acute distress Cardiovascular: RRR, S1/S2 +, no rubs, no gallops Respiratory: CTA bilaterally, no wheezing, no rhonchi Abdominal: Soft, NT, ND, bowel sounds + Extremities: no edema, no cyanosis    The results of significant diagnostics from this hospitalization (including imaging, microbiology, ancillary and laboratory) are listed below for reference.     Microbiology: Recent Results (from the past 240 hour(s))  Respiratory Panel by RT PCR (Flu A&B, Covid) - Nasopharyngeal Swab     Status: None   Collection Time: 04/05/20  5:12 AM   Specimen: Nasopharyngeal Swab  Result Value Ref Range Status   SARS Coronavirus 2 by RT PCR NEGATIVE NEGATIVE Final    Comment: (NOTE) SARS-CoV-2 target nucleic acids are NOT DETECTED. The SARS-CoV-2 RNA is generally detectable in upper respiratoy specimens during the acute phase of infection. The lowest concentration of SARS-CoV-2 viral copies this assay can detect is 131 copies/mL. A negative result does not preclude SARS-Cov-2 infection and should not be used as the sole basis for treatment or other patient management decisions. A negative result may occur with  improper specimen collection/handling, submission of specimen other than nasopharyngeal swab, presence of viral mutation(s) within the areas targeted by this assay, and inadequate number of viral copies (<131 copies/mL). A negative result must be combined with clinical observations, patient history, and epidemiological information.  The expected result is Negative. Fact Sheet for Patients:  04/07/20 Fact Sheet for Healthcare Providers:  https://www.moore.com/ This test is not yet ap proved or cleared by the https://www.young.biz/ FDA and  has been authorized for detection and/or diagnosis of SARS-CoV-2 by FDA under an Emergency Use Authorization (EUA). This EUA will remain  in effect (meaning this test can be used) for the duration of the COVID-19 declaration under Section 564(b)(1) of the Act, 21 U.S.C. section 360bbb-3(b)(1), unless the authorization is terminated or revoked sooner.    Influenza A by PCR NEGATIVE NEGATIVE Final   Influenza B by PCR NEGATIVE NEGATIVE Final    Comment: (NOTE) The Xpert Xpress SARS-CoV-2/FLU/RSV assay is intended as an aid in  the diagnosis of influenza from Nasopharyngeal swab specimens and  should not be used as a sole basis for treatment. Nasal washings and  aspirates are unacceptable for Xpert Xpress SARS-CoV-2/FLU/RSV  testing. Fact Sheet for Patients: [place] Fact Sheet for Healthcare Providers: https://www.moore.com/ This test is not yet approved or cleared by the https://www.young.biz/ FDA and  has been authorized for detection and/or diagnosis of SARS-CoV-2 by  FDA under  an Emergency Use Authorization (EUA). This EUA will remain  in effect (meaning this test can be used) for the duration of the  Covid-19 declaration under Section 564(b)(1) of the Act, 21  U.S.C. section 360bbb-3(b)(1), unless the authorization is  terminated or revoked. Performed at [facility], [place] [address], [place], [place] [address]   MRSA PCR Screening     Status: None   Collection Time: 04/05/20  4:25 PM   Specimen: Nasopharyngeal  Result Value Ref Range Status   MRSA by PCR NEGATIVE NEGATIVE Final    Comment:        The GeneXpert MRSA Assay (FDA approved for NASAL specimens only),  is one component of a comprehensive MRSA colonization surveillance program. It is not intended to diagnose MRSA infection nor to guide or monitor treatment for MRSA infections. Performed at [facility], [place] [address], [place], [place] [address]      Labs: BNP (last 3 results) No results for input(s): BNP in the last 8760 hours. Basic Metabolic Panel: Recent Labs  Lab 04/05/20 0149 04/05/20 0306 04/05/20 0835 04/06/20 0638  NA 135 135 138 135  K 4.9 4.8 4.2 3.8  CL 102 102 108 103  CO2 22  --  22 20*  GLUCOSE 235* 233* 130* 100*  BUN 16 15 12 9   CREATININE 0.90 0.80 0.73 0.91  CALCIUM 9.3  --  8.6* 9.3  MG  --   --   --  1.7  PHOS  --   --   --  4.0   Liver Function Tests: Recent Labs  Lab 04/05/20 0149 04/06/20 0638  AST 22 24  ALT 12 10  ALKPHOS 90 85  BILITOT 0.6 0.8  PROT 8.2* 7.3  ALBUMIN 4.4 3.8   No results for input(s): LIPASE, AMYLASE in the last 168 hours. No results for input(s): AMMONIA in the last 168 hours. CBC: Recent Labs  Lab 04/05/20 0149 04/05/20 0306 04/06/20 0638  WBC 10.3  --  9.0  NEUTROABS 7.4  --   --   HGB 13.7 15.3* 14.2  HCT 44.9 45.0 44.9  MCV 81.9  --  79.9*  PLT 353  --  319   Cardiac Enzymes: No results for input(s): CKTOTAL, CKMB, CKMBINDEX, TROPONINI in the last 168 hours. BNP: Invalid input(s): POCBNP CBG: Recent Labs  Lab 04/06/20 1658 04/06/20 1948 04/06/20 2343 04/07/20 0402 04/07/20 0748  GLUCAP 124* 132* 116* 114* 148*   D-Dimer No results for input(s): DDIMER in the last 72 hours. Hgb A1c Recent Labs    04/05/20 0516  HGBA1C 5.7*   Lipid Profile No results for input(s): CHOL, HDL, LDLCALC, TRIG, CHOLHDL, LDLDIRECT in the last 72 hours. Thyroid function studies No results for input(s): TSH, T4TOTAL, T3FREE, THYROIDAB in the last 72 hours.  Invalid input(s): FREET3 Anemia work up No results for input(s): VITAMINB12, FOLATE, FERRITIN, TIBC, IRON, RETICCTPCT in the last 72  hours. Urinalysis    Component Value Date/Time   COLORURINE YELLOW 10/06/2019 1945   APPEARANCEUR HAZY (A) 10/06/2019 1945   LABSPEC >=1.030 03/31/2020 1000   PHURINE 6.0 03/31/2020 1000   GLUCOSEU NEGATIVE 03/31/2020 1000   HGBUR NEGATIVE 03/31/2020 1000   BILIRUBINUR SMALL (A) 03/31/2020 1000   KETONESUR 15 (A) 03/31/2020 1000   PROTEINUR 30 (A) 03/31/2020 1000   UROBILINOGEN 1.0 03/31/2020 1000   NITRITE NEGATIVE 03/31/2020 1000   LEUKOCYTESUR NEGATIVE 03/31/2020 1000   Sepsis Labs Invalid input(s): PROCALCITONIN,  WBC,  LACTICIDVEN Microbiology  Recent Results (from the past 240 hour(s))  Respiratory Panel by RT PCR (Flu A&B, Covid) - Nasopharyngeal Swab     Status: None   Collection Time: 04/05/20  5:12 AM   Specimen: Nasopharyngeal Swab  Result Value Ref Range Status   SARS Coronavirus 2 by RT PCR NEGATIVE NEGATIVE Final    Comment: (NOTE) SARS-CoV-2 target nucleic acids are NOT DETECTED. The SARS-CoV-2 RNA is generally detectable in upper respiratoy specimens during the acute phase of infection. The lowest concentration of SARS-CoV-2 viral copies this assay can detect is 131 copies/mL. A negative result does not preclude SARS-Cov-2 infection and should not be used as the sole basis for treatment or other patient management decisions. A negative result may occur with  improper specimen collection/handling, submission of specimen other than nasopharyngeal swab, presence of viral mutation(s) within the areas targeted by this assay, and inadequate number of viral copies (<131 copies/mL). A negative result must be combined with clinical observations, patient history, and epidemiological information. The expected result is Negative. Fact Sheet for Patients:  https://www.moore.com/ Fact Sheet for Healthcare Providers:  https://www.young.biz/ This test is not yet ap proved or cleared by the [place] FDA and  has been authorized for  detection and/or diagnosis of SARS-CoV-2 by FDA under an Emergency Use Authorization (EUA). This EUA will remain  in effect (meaning this test can be used) for the duration of the COVID-19 declaration under Section 564(b)(1) of the Act, 21 U.S.C. section 360bbb-3(b)(1), unless the authorization is terminated or revoked sooner.    Influenza A by PCR NEGATIVE NEGATIVE Final   Influenza B by PCR NEGATIVE NEGATIVE Final    Comment: (NOTE) The Xpert Xpress SARS-CoV-2/FLU/RSV assay is intended as an aid in  the diagnosis of influenza from Nasopharyngeal swab specimens and  should not be used as a sole basis for treatment. Nasal washings and  aspirates are unacceptable for Xpert Xpress SARS-CoV-2/FLU/RSV  testing. Fact Sheet for Patients: https://www.moore.com/ Fact Sheet for Healthcare Providers: https://www.young.biz/ This test is not yet approved or cleared by the [place] FDA and  has been authorized for detection and/or diagnosis of SARS-CoV-2 by  FDA under an Emergency Use Authorization (EUA). This EUA will remain  in effect (meaning this test can be used) for the duration of the  Covid-19 declaration under Section 564(b)(1) of the Act, 21  U.S.C. section 360bbb-3(b)(1), unless the authorization is  terminated or revoked. Performed at [facility], [address] [place], [place] [address]   MRSA PCR Screening     Status: None   Collection Time: 04/05/20  4:25 PM   Specimen: Nasopharyngeal  Result Value Ref Range Status   MRSA by PCR NEGATIVE NEGATIVE Final    Comment:        The GeneXpert MRSA Assay (FDA approved for NASAL specimens only), is one component of a comprehensive MRSA colonization surveillance program. It is not intended to diagnose MRSA infection nor to guide or monitor treatment for MRSA infections. Performed at [facility], [address] [place], [place] [address]       Time coordinating discharge: 22 MINUTES.   SIGNED:   Kathlen Mody, MD  [facility] 04/07/2020, 11:08 AM

## 2020-04-07 NOTE — Progress Notes (Signed)
Pt accepted to [facility]; bed 105-1    Shuvon Rankin, NP is the accepting provider.    [name] is the attending provider.    Call report to [phone]  [facility] notified.     Pt is involuntary and will be transported by law enforcement  Pt is scheduled to arrive at [facility] at 8pm.   Wells Guiles, LCSW, LCAS Disposition CSW [facility]/TTS [phone]

## 2020-04-07 NOTE — TOC Initial Note (Signed)
Transition of Care [facility]) - Initial/Assessment Note    Patient Details  Name: Tamara Guzman MRN: [id number] Date of Birth: Aug 06, 2001  Transition of Care (TOC) CM/SW Contact:    Armanda Heritage, RN Phone Number: 04/07/2020, 2:18 PM  Clinical Narrative:     Patient has a bed at [facility] this evening at 8pm.  Patient is currently IVC and will be transported by [employer].                Expected Discharge Plan: Psychiatric Hospital Barriers to Discharge: No Barriers Identified   Patient Goals and CMS Choice Patient states their goals for this hospitalization and ongoing recovery are:: to go to inpatient psychiatric hospital, patient is currently IVC      Expected Discharge Plan and Services Expected Discharge Plan: Psychiatric Hospital   Discharge Planning Services: CM Consult   Living arrangements for the past 2 months: Single Family Home                                      Prior Living Arrangements/Services Living arrangements for the past 2 months: Single Family Home Lives with:: Parents                   Activities of Daily Living Home Assistive Devices/Equipment: None ADL Screening (condition at time of admission) Patient's cognitive ability adequate to safely complete daily activities?: Yes Is the patient deaf or have difficulty hearing?: No Does the patient have difficulty seeing, even when wearing glasses/contacts?: No Does the patient have difficulty concentrating, remembering, or making decisions?: No Patient able to express need for assistance with ADLs?: Yes Does the patient have difficulty dressing or bathing?: No Independently performs ADLs?: Yes (appropriate for developmental age) Does the patient have difficulty walking or climbing stairs?: No Weakness of Legs: None Weakness of Arms/Hands: None  Permission Sought/Granted                  Emotional Assessment              Admission diagnosis:  Suicide attempt (HCC)  [T14.91XA] Medication overdose, intentional self-harm, initial encounter (HCC) [T50.902A] Drug overdose, multiple drugs, intentional self-harm, initial encounter (HCC) [T50.912A] Drug overdose, intentional (HCC) [T50.902A] Patient Active Problem List   Diagnosis Date Noted  . Drug overdose, intentional (HCC) 04/06/2020  . Drug overdose, multiple drugs, intentional self-harm, initial encounter (HCC) 04/05/2020  . Hyperglycemia 04/05/2020  . MDD (major depressive disorder), severe (HCC) 07/09/2018  . Post traumatic stress disorder (PTSD) 07/09/2018  . ADHD (attention deficit hyperactivity disorder) 07/09/2018   PCP:  Patient, No Pcp Per Pharmacy:   [id number] [place], [place] - [address] AT [facility] [address] [place] [address] Phone: [phone] Fax: [phone]     Social Determinants of Health (SDOH) Interventions    Readmission Risk Interventions No flowsheet data found.
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
## 2020-04-08 DIAGNOSIS — F322 Major depressive disorder, single episode, severe without psychotic features: Secondary | ICD-10-CM

## 2020-04-08 LAB — GLUCOSE, CAPILLARY
Glucose-Capillary: 102 mg/dL — ABNORMAL HIGH (ref 70–99)
Glucose-Capillary: 99 mg/dL (ref 70–99)

## 2020-04-08 MED ORDER — HYDROXYZINE HCL 25 MG PO TABS
25.0000 mg | ORAL_TABLET | Freq: Every evening | ORAL | Status: DC | PRN
  Administered 2020-04-08 – 2020-04-09 (×2): 25 mg via ORAL
  Filled 2020-04-08 (×2): qty 1

## 2020-04-08 MED ORDER — OXCARBAZEPINE 150 MG PO TABS
150.0000 mg | ORAL_TABLET | Freq: Two times a day (BID) | ORAL | Status: DC
  Administered 2020-04-08 – 2020-04-10 (×4): 150 mg via ORAL
  Filled 2020-04-08 (×6): qty 1

## 2020-04-08 MED ORDER — BUPROPION HCL ER (XL) 150 MG PO TB24
150.0000 mg | ORAL_TABLET | Freq: Every day | ORAL | Status: DC
  Administered 2020-04-08 – 2020-04-13 (×6): 150 mg via ORAL
  Filled 2020-04-08 (×9): qty 1

## 2020-04-08 NOTE — [facility] Group Notes (Signed)
[facility] Group Notes:  (Nursing/MHT/Case Management/Adjunct)  Date:  04/08/2020  Time:  10:38 AM  Type of Therapy:  goals group  Participation Level:  Minimal  Participation Quality:  Appropriate  Affect:  Appropriate  Cognitive:  Appropriate  Insight:  Lacking  Engagement in Group:  None  Modes of Intervention:  Activity, Discussion, Exploration and Support  Summary of Progress/Problems:  Tamara Guzman 04/08/2020, 10:38 AM

## 2020-04-08 NOTE — Progress Notes (Signed)
Recreation Therapy Notes  INPATIENT RECREATION THERAPY ASSESSMENT  Patient Details Name: Tamara Guzman MRN: [id number] DOB: September 15, 2001 Today's Date: 04/08/2020       Information Obtained From: Patient  Able to Participate in Assessment/Interview: Yes  Patient Presentation: Responsive  Reason for Admission (Per Patient): Suicide Attempt  Patient Stressors: School, Family  Coping Skills:   Isolation, Avoidance, Arguments, Substance Abuse  Leisure Interests (2+):  (go on a walk)  Frequency of Recreation/Participation: Weekly  Awareness of Community Resources:  Yes  Community Resources:  Park  Current Use: No  If no, Barriers?:    Expressed Interest in [employer] Information: No  County of Residence:  [place]  Patient Main Form of Transportation: Set designer  Patient Strengths:  "how honest I can be and how I can make other people happy"  Patient Identified Areas of Improvement:  "my attitude and stop worrying about everyone else"  Patient Goal for Hospitalization:  "how I cope with things"  Current SI (including self-harm):  No  Current HI:  No  Current AVH: No  Staff Intervention Plan: Group Attendance, Collaborate with Interdisciplinary Treatment Team  Consent to Intern Participation: N/A  Deidre Ala, LRT/CTRS   Lawrence [place] Kamesha Herne 04/08/2020, 12:34 PM

## 2020-04-08 NOTE — Progress Notes (Signed)
Inpatient Diabetes Program Recommendations  AACE/ADA: New Consensus Statement on Inpatient Glycemic Control (2015)  Target Ranges:  Prepandial:   less than 140 mg/dL      Peak postprandial:   less than 180 mg/dL (1-2 hours)      Critically ill patients:  140 - 180 mg/dL   Lab Results  Component Value Date   GLUCAP 99 04/08/2020   HGBA1C 5.7 (H) 04/05/2020    Review of Glycemic Control  Diabetes history: None Outpatient Diabetes medications: N/A Current orders for Inpatient glycemic control: Novolog 0-6 units tidwc  HgbA1C - 5.7% - indicates pre-diabetes.  Inpatient Diabetes Program Recommendations:     Lifestyle modification with weight loss, daily exercise. BMI - 43.95!!   105 kg  Weight-loss is imperative to prevent full blown diabetes.   Encourage pt to make healthy choices using portion control in cafeteria.  No DM meds needed. F/U with PCP for pre-diabetes management.  Thank you. Ailene Ards, RD, LDN, CDE Inpatient Diabetes Coordinator [phone]

## 2020-04-08 NOTE — [facility] Suicide Risk Assessment (Signed)
[facility] Admission Suicide Risk Assessment   Nursing information obtained from:  Patient, Family, Review of record Demographic factors:  Adolescent or young adult Current Mental Status:  Suicidal ideation indicated by patient, Self-harm behaviors, Intention to act on suicide plan Loss Factors:  NA Historical Factors:  Prior suicide attempts, Impulsivity Risk Reduction Factors:  Positive social support, Positive therapeutic relationship, Living with another person, especially a relative, Sense of responsibility to family, Positive coping skills or problem solving skills  Total Time spent with patient: 30 minutes Principal Problem: MDD (major depressive disorder), severe ([place]) Diagnosis:  Principal Problem:   MDD (major depressive disorder), severe ([place]) Active Problems:   Post traumatic stress disorder (PTSD)   Drug overdose, multiple drugs, intentional self-harm, initial encounter ([place])  Subjective Data: Tamara Guzman is a 19 y.o. female, senior at [employer] and lives with her mother since August 2020 after came back into the custody from [place].  Patient was [facility] to behavioral health [facility] from the [facility] after medically stabilized due to intentional overdose of multiple medications including Cipro, Latuda clonidine and unknown other medications as a suicidal attempt.  Patient reported she has been off of her medications since the beginning of this year when she become 19 years old and stressed about failing her school grades and loss of an: Best friend and anniversary of the grandmother.  Patient is known for multiple acute psychiatric admissions at [facility] [place] x2, behavioral health [facility] in August 2019 and [facility] before coming into [facility] and mom's care.  Patient was diagnosed with bipolar depression, ADHD, anxiety and PTSD.  Patient has been in and out of the foster care homes and group home placements while  staying with her DSS custody.     Continued Clinical Symptoms:  Alcohol Use Disorder Identification Test Final Score (AUDIT): 2 The "Alcohol Use Disorders Identification Test", Guidelines for Use in Primary Care, Second Edition.  World Pharmacologist [facility]). Score between 0-7:  no or low risk or alcohol related problems. Score between 8-15:  moderate risk of alcohol related problems. Score between 16-19:  high risk of alcohol related problems. Score 20 or above:  warrants further diagnostic evaluation for alcohol dependence and treatment.   CLINICAL FACTORS:   Severe Anxiety and/or Agitation Bipolar Disorder:   Depressive phase Depression:   Anhedonia Hopelessness Impulsivity Insomnia Recent sense of peace/wellbeing Severe More than one psychiatric diagnosis Unstable or Poor Therapeutic Relationship Previous Psychiatric Diagnoses and Treatments   Musculoskeletal: Strength & Muscle Tone: within normal limits Gait & Station: normal Patient leans: N/A  Psychiatric Specialty Exam: Physical Exam as per history and physical  Review of Systems  Constitutional: Negative.   HENT: Negative.   Eyes: Negative.   Respiratory: Negative.   Cardiovascular: Negative.   Gastrointestinal: Negative.   Skin: Negative.   Neurological: Negative.   Psychiatric/Behavioral: Positive for suicidal ideas. The patient is nervous/anxious.      Blood pressure 106/62, pulse 84, temperature 98.3 F (36.8 C), resp. rate 16, height 5\' 1"  (1.549 m), weight 105.5 kg, SpO2 100 %.Body mass index is 43.95 kg/m.  General Appearance: Casual  Eye Contact:  Good  Speech:  Clear and Coherent  Volume:  Normal  Mood:  Depressed, Hopeless, Irritable and Worthless  Affect:  Labile  Thought Process:  Coherent, Goal Directed and Descriptions of Associations: Intact  Orientation:  Full (Time, Place, and Person)  Thought Content:  Logical  Suicidal Thoughts:  Yes.  with intent/plan  Homicidal Thoughts:  No   Memory:  Immediate;   Fair Recent;   Fair Remote;   Fair  Judgement:  Intact  Insight:  Fair  Psychomotor Activity:  Decreased  Concentration:  Concentration: Fair and Attention Span: Fair  Recall:  [employer] of Knowledge:  Good  Language:  Good  Akathisia:  Negative  Handed:  Right  AIMS (if indicated):     Assets:  Communication Skills Desire for Improvement Financial Resources/Insurance Housing Intimacy Leisure Time Physical Health Resilience Social Support Talents/Skills Transportation Vocational/Educational  ADL's:  Intact  Cognition:  WNL  Sleep:         COGNITIVE FEATURES THAT CONTRIBUTE TO RISK:  Closed-mindedness, Loss of executive function, Polarized thinking and Thought constriction (tunnel vision)    SUICIDE RISK:   Severe:  Frequent, intense, and enduring suicidal ideation, specific plan, no subjective intent, but some objective markers of intent (i.e., choice of lethal method), the method is accessible, some limited preparatory behavior, evidence of impaired self-control, severe dysphoria/symptomatology, multiple risk factors present, and few if any protective factors, particularly a lack of social support.  PLAN OF CARE: Admit due to status post suicidal attempt with multiple medication and reportedly overwhelmed with the poor school grades, worried about graduation and loss of friend and uncle etc.  Patient has been noncompliant with medication since beginning of this year.  Patient needs crisis stabilization, safety monitoring and medication management.   I certify that inpatient services furnished can reasonably be expected to improve the patient's condition.   Leata Mouse, MD 04/08/2020, 8:32 AM

## 2020-04-08 NOTE — Plan of Care (Addendum)
Patient has been drowsy at times and has gone back to sleep. Patient reports that she has set a goal for today to stay positive and work on communication with her mother. Her appetite is good. Her sleep is fair.  This RN requested a diabetic consult.   Problem: Education: Goal: Utilization of techniques to improve thought processes will improve Outcome: Progressing Goal: Knowledge of the prescribed therapeutic regimen will improve Outcome: Progressing   Problem: Activity: Goal: Imbalance in normal sleep/wake cycle will improve 04/08/2020 1109 by Loren Racer, RN Outcome: Progressing 04/08/2020 0843 by Loren Racer, RN Outcome: Progressing   Problem: Health Behavior/Discharge Planning: Goal: Compliance with therapeutic regimen will improve 04/08/2020 1109 by Loren Racer, RN Outcome: Progressing 04/08/2020 0843 by Loren Racer, RN Outcome: Progressing

## 2020-04-08 NOTE — H&P (Signed)
Psychiatric Admission Assessment Child/Adolescent  Patient Identification: Tamara Guzman MRN:  [id number] Date of Evaluation:  04/08/2020 Chief Complaint:  MDD (major depressive disorder), severe (HCC) [F32.2] Principal Diagnosis: MDD (major depressive disorder), severe (HCC) Diagnosis:  Principal Problem:   MDD (major depressive disorder), severe (HCC) Active Problems:   Post traumatic stress disorder (PTSD)   Drug overdose, multiple drugs, intentional self-harm, initial encounter (HCC)  History of Present Illness: Tamara Guzman is a 19 y.o. 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  Patient has a 2 grownup siblings from her mother's side and she has good communication with them but they live on their own.  Patient [facility] behavioral health [facility] from [facility] where she was stabilized after intentional overdose of multiple medications including Cipro, Latuda, clonidine and unknown medications..  Patient has been diagnosed with bipolar depression, anxiety, PTSD, ADHD, overweight and was noncompliant with psychiatric medications before suicidal attempt.  Patient reported she did took overdose of medication because of stress about her school, graduation reportedly not doing her best, trying to meet the guidelines for exams.  Patient reported she made all efforts for the last 2 semesters because of feeling depressed, medication making her sleepy could not stay up and do her work.  Patient reported she stopped taking all her medication beginning of 2021.  Reportedly patient stated she want to sleep and not wake up.  Patient endorses her current stressors were her best friend from the group home died in 15-Nov-2020shot by deputy in [place], her grand mother's 10th or 11th anniversary last week and her uncle passed away while she is living in a group home with the medical problems.  Patient stated feelings sad, sleeping a  lot, lack of interest, decreased focus and concentration, failing her grades because not able to do her work and feeling guilty about overdose and her appetite is no changes.  Patient reported her weight has been up and down.  Patient has no homicidal ideation, psychosis and paranoid delusions.  Patient reports smoking marijuana, tobacco and drinking alcohol once a month with friends and last time used was about a month ago.  Patient denied physical emotional and sexual abuse and neglect.  Spoke with mother: She needs to back on her medication, and hoping to come home and back to school. She took overdose - stressed about school, friend died in police shooting last 10-28-2023 in [facility], She uncle died - due to medical problems while she was in group home, last week his birth anniversary and grandma died - anniversary which is beginning of the April. Mom does not think she wants to die and she is called 911 after took the pills. Mom prefers new medication.    Associated Signs/Symptoms: Depression Symptoms:  depressed mood, anhedonia, hypersomnia, psychomotor agitation, fatigue, feelings of worthlessness/guilt, difficulty concentrating, hopelessness, impaired memory, suicidal attempt, anxiety, loss of energy/fatigue, disturbed sleep, decreased labido, decreased appetite, (Hypo) Manic Symptoms:  Distractibility, Impulsivity, Irritable Mood, Anxiety Symptoms:  Excessive Worry, Psychotic Symptoms:  Denied PTSD Symptoms: NA Total Time spent with patient: 1 hour  Past Psychiatric History: Bipolar depression, anxiety and ADHD and she came home from obtaining custody in 07/2019, was in a group home in [place]. [facility] x 2 weeks, was in [facility] in [place] x few months. She was taking Latuda 40 mg, 60mg , Clonidine 0.1 mg Qhs, vitamine D. She was on seroquel but stopped taking her medication after December. She  reports that she could not stay awake and do school work.   Her [facility] - started a month ago for medication and no therapist. [facility] [place] [facility].  Is the patient at risk to self? Yes.    Has the patient been a risk to self in the past 6 months? No.  Has the patient been a risk to self within the distant past? Yes.    Is the patient a risk to others? No.  Has the patient been a risk to others in the past 6 months? No.  Has the patient been a risk to others within the distant past? No.   Prior Inpatient Therapy:   Prior Outpatient Therapy:    Alcohol Screening: 1. How often do you have a drink containing alcohol?: Monthly or less 2. How many drinks containing alcohol do you have on a typical day when you are drinking?: 1 or 2 3. How often do you have six or more drinks on one occasion?: Less than monthly AUDIT-C Score: 2 4. How often during the last year have you found that you were not able to stop drinking once you had started?: Never 5. How often during the last year have you failed to do what was normally expected from you becasue of drinking?: Never 6. How often during the last year have you needed a first drink in the morning to get yourself going after a heavy drinking session?: Never 7. How often during the last year have you had a feeling of guilt of remorse after drinking?: Never 8. How often during the last year have you been unable to remember what happened the night before because you had been drinking?: Never 9. Have you or someone else been injured as a result of your drinking?: No 10. Has a relative or friend or a doctor or another health worker been concerned about your drinking or suggested you cut down?: No Alcohol Use Disorder Identification Test Final Score (AUDIT): 2 Alcohol Brief Interventions/Follow-up: AUDIT Score <7 follow-up not indicated Substance Abuse History in the last 12 months:  Yes.   Consequences of Substance Abuse: NA Previous Psychotropic Medications: Yes  Psychological  Evaluations: Yes  Past Medical History:  Past Medical History:  Diagnosis Date  . ADHD   . Anxiety   . Bipolar 1 disorder (HCC)   . Depression   . Obesity    History reviewed. No pertinent surgical history. Family History: History reviewed. No pertinent family history. Family Psychiatric  History: Mother has anxiety and bipolar disorder. She has no current medication.  Tobacco Screening: Have you used any form of tobacco in the last 30 days? (Cigarettes, Smokeless Tobacco, Cigars, and/or Pipes): Yes Tobacco use, Select all that apply: pipe use, not daily(Pt vapes) Are you interested in Tobacco Cessation Medications?: No, patient refused Counseled patient on smoking cessation including recognizing danger situations, developing coping skills and basic information about quitting provided: Refused/Declined practical counseling Social History:  Social History   Substance and Sexual Activity  Alcohol Use Not Currently     Social History   Substance and Sexual Activity  Drug Use Yes  . Types: Marijuana    Social History   Socioeconomic History  . Marital status: Single    Spouse name: Not on file  . Number of children: Not on file  . Years of education: Not on file  . Highest education level: Not on file  Occupational History  . Not  on file  Tobacco Use  . Smoking status: Never Smoker  . Smokeless tobacco: Never Used  Substance and Sexual Activity  . Alcohol use: Not Currently  . Drug use: Yes    Types: Marijuana  . Sexual activity: Yes    Birth control/protection: None  Other Topics Concern  . Not on file  Social History Narrative  . Not on file   Social Determinants of Health   Financial Resource Strain:   . Difficulty of Paying Living Expenses:   Food Insecurity:   . Worried About Programme researcher, broadcasting/film/video in the Last Year:   . Barista in the Last Year:   Transportation Needs:   . Freight forwarder (Medical):   Marland Kitchen Lack of Transportation (Non-Medical):    Physical Activity:   . Days of Exercise per Week:   . Minutes of Exercise per Session:   Stress:   . Feeling of Stress :   Social Connections:   . Frequency of Communication with Friends and Family:   . Frequency of Social Gatherings with Friends and Family:   . Attends Religious Services:   . Active Member of Clubs or Organizations:   . Attends Banker Meetings:   Marland Kitchen Marital Status:    Additional Social History:Patient has previous suicidal attempts 3 of 4 years ago reportedly slit her wrist.  Patient reported having trouble with concentration but not sure she was diagnosed with ADHD or not.  Patient denies being the diagnosis of bipolar disorder instead of her mother reports she had a bipolar disorder when she was discharged from the mental health facility in [facility] before coming to [facility].  Patient mom reported she got custody of her in August 2020 and she was seen mental health in the beginning of January 2021 for medication management.  Patient has [facility] when she was relocated to [place].  Patient denies auditory/visual hallucinations, delusions and paranoia.  Patient has been healthy without chronic medical conditions.  Patient has a high blood sugar which required starting insulin while being in the [facility] emergency department.   Developmental History: She was born and raised by mother until 56, DSS placed with grandma, 1 year, than went to other grandma x 6 months and dad x 2 1/2 years and than in and out of hospital and foster home from age [age]. She was under [employer]. Mom moved to [place] x 5 years ago.  Prenatal History: Birth History: Postnatal Infancy: Developmental History: Milestones:  Sit-Up:  Crawl:  Walk:  Speech: School History:    Legal History: Hobbies/Interests: Allergies:   Allergies  Allergen Reactions  . Other Hives and Other (See Comments)    PATIENT DEVELOPS HIVES IF EXPOSED TO ANY  TREE NUTS!! Peanut  . Chocolate Hives  . Cocoa Other (See Comments)  . Peanut-Containing Drug Products Hives    Lab Results:  Results for orders placed or performed during the hospital encounter of 04/07/20 (from the past 48 hour(s))  Glucose, capillary     Status: Abnormal   Collection Time: 04/08/20  6:42 AM  Result Value Ref Range   Glucose-Capillary 102 (H) 70 - 99 mg/dL    Comment: Glucose reference range applies only to samples taken after fasting for at least 8 hours.    Blood Alcohol level:  Lab Results  Component Value Date   ETH <10 04/05/2020   ETH <10 05/03/2018    Metabolic Disorder Labs:  Lab  Results  Component Value Date   HGBA1C 5.7 (H) 04/05/2020   MPG 116.89 04/05/2020   MPG 108.28 07/09/2018   Lab Results  Component Value Date   PROLACTIN 1.5 (L) 07/09/2018   Lab Results  Component Value Date   CHOL 161 07/09/2018   TRIG 60 07/09/2018   HDL 52 07/09/2018   CHOLHDL 3.1 07/09/2018   VLDL 12 07/09/2018   LDLCALC 97 07/09/2018    Current Medications: Current Facility-Administered Medications  Medication Dose Route Frequency Provider Last Rate Last Admin  . hydrOXYzine (ATARAX/VISTARIL) tablet 25 mg  25 mg Oral TID PRN Rankin, Shuvon B, NP      . insulin aspart (novoLOG) injection 0-6 Units  0-6 Units Subcutaneous TID WC Lindon Romp A, NP       PTA Medications: Medications Prior to Admission  Medication Sig Dispense Refill Last Dose  . hydrOXYzine (ATARAX/VISTARIL) 25 MG tablet Take 1 tablet (25 mg total) by mouth 3 (three) times daily as needed for anxiety (agitation). 30 tablet 0 Unknown at Unknown time     Psychiatric Specialty Exam: See MD admission SRA Physical Exam  Review of Systems  Blood pressure 106/62, pulse 84, temperature 98.3 F (36.8 C), resp. rate 16, height 5\' 1"  (1.549 m), weight 105.5 kg, SpO2 100 %.Body mass index is 43.95 kg/m.  Sleep:       Treatment Plan Summary:  1. Patient was admitted to the Child and  adolescent unit at [facility] under the service of Dr. Louretta Shorten. 2. Routine labs, which include CBC, CMP, UDS, UA, medical consultation were reviewed and routine PRN's were ordered for the patient. UDS negative, Tylenol, salicylate, alcohol level negative. And hematocrit, CMP no significant abnormalities. 3. Will maintain Q 15 minutes observation for safety. 4. During this hospitalization the patient will receive psychosocial and education assessment 5. Patient will participate in group, milieu, and family therapy. Psychotherapy: Social and Airline pilot, anti-bullying, learning based strategies, cognitive behavioral, and family object relations individuation separation intervention psychotherapies can be considered. 6. Medication management: Patient will be given a trial of Trileptal 150 mg 2 times daily for mood stabilization, Wellbutrin XL 150 mg for depression and better concentration and Vistaril at bedtime for anxiety/insomnia.  Patient consented to discuss case with mother and mother agreed for the above medication after brief discussion about risk and benefits of the medications.  Will obtain diabetic care coordinator consult to determine patient needed insulin therapy or not. 7. Patient and guardian were educated about medication efficacy and side effects. Patient  agreeable with medication trial will speak with guardian.  8. Will continue to monitor patient's mood and behavior.  Physician Treatment Plan for Primary Diagnosis: MDD (major depressive disorder), severe ([place]) Long Term Goal(s): Improvement in symptoms so as ready for discharge  Short Term Goals: Ability to identify changes in lifestyle to reduce recurrence of condition will improve, Ability to verbalize feelings will improve, Ability to disclose and discuss suicidal ideas and Ability to demonstrate self-control will improve  Physician Treatment Plan for Secondary Diagnosis: Principal  Problem:   MDD (major depressive disorder), severe ([place]) Active Problems:   Post traumatic stress disorder (PTSD)   Drug overdose, multiple drugs, intentional self-harm, initial encounter ([place])  Long Term Goal(s): Improvement in symptoms so as ready for discharge  Short Term Goals: Ability to identify and develop effective coping behaviors will improve, Ability to maintain clinical measurements within normal limits will improve, Compliance with prescribed medications will improve and Ability to identify  triggers associated with substance abuse/mental health issues will improve  I certify that inpatient services furnished can reasonably be expected to improve the patient's condition.    Leata Mouse, MD 4/27/20218:36 AM

## 2020-04-08 NOTE — Plan of Care (Signed)
  Problem: Education: Goal: Knowledge of the prescribed therapeutic regimen will improve Outcome: Progressing   Problem: Activity: Goal: Imbalance in normal sleep/wake cycle will improve Outcome: Progressing   Problem: Health Behavior/Discharge Planning: Goal: Compliance with therapeutic regimen will improve Outcome: Progressing

## 2020-04-08 NOTE — Progress Notes (Signed)
Recreation Therapy Notes  Animal-Assisted Therapy (AAT) Program Checklist/Progress Notes Patient Eligibility Criteria Checklist & Daily Group note for Rec Tx Intervention  Date: 04/08/2020 Time:10:00- 10:30 am  Location: 600 hall day room  AAA/T Program Assumption of Risk Form signed by Patient/ or Parent Legal Guardian Yes  Patient is free of allergies or sever asthma  Yes  Patient reports no fear of animals Yes  Patient reports no history of cruelty to animals Yes   Patient understands his/her participation is voluntary Yes  Patient washes hands before animal contact Yes  Patient washes hands after animal contact Yes  Goal Area(s) Addresses:  Patient will demonstrate appropriate social skills during group session.  Patient will demonstrate ability to follow instructions during group session.  Patient will identify reduction in anxiety level due to participation in animal assisted therapy session.    Behavioral Response: appropriate  Education: Communication, Charity fundraiser, Appropriate Animal Interaction   Education Outcome: Acknowledges education/In group clarification offered/Needs additional education.   Clinical Observations/Feedback:  Patient with peers educated on search and rescue efforts. Patient learned and used appropriate command to get therapy dog to release toy from mouth, as well as hid toy for therapy dog to find. Patient pet therapy dog appropriately from floor level, shared stories about their pets at home with group and asked appropriate questions about therapy dog and his training. Patient successfully recognized a reduction in their stress level as a result of interaction with therapy dog.   Tamara Guzman L. Dulcy Fanny 04/08/2020 2:05 PM

## 2020-04-08 NOTE — Progress Notes (Signed)
Patient ID: Alexcia Moore Marsh, female   DOB: 07/09/2001, 18 y.o.   MRN: [id number] [place] NOVEL CORONAVIRUS (COVID-19) DAILY CHECK-OFF SYMPTOMS - answer yes or no to each - every day NO YES  Have you had a fever in the past 24 hours?  . Fever (Temp > 37.80C / 100F) X   Have you had any of these symptoms in the past 24 hours? . New Cough .  Sore Throat  .  Shortness of Breath .  Difficulty Breathing .  Unexplained Body Aches   X   Have you had any one of these symptoms in the past 24 hours not related to allergies?   . Runny Nose .  Nasal Congestion .  Sneezing   X   If you have had runny nose, nasal congestion, sneezing in the past 24 hours, has it worsened?  X   EXPOSURES - check yes or no X   Have you traveled outside the state in the past 14 days?  X   Have you been in contact with someone with a confirmed diagnosis of COVID-19 or PUI in the past 14 days without wearing appropriate PPE?  X   Have you been living in the same home as a person with confirmed diagnosis of COVID-19 or a PUI (household contact)?    X   Have you been diagnosed with COVID-19?    X              What to do next: Answered NO to all: Answered YES to anything:   Proceed with unit schedule Follow the [facility] Inpatient Flowsheet.   

## 2020-04-08 NOTE — [facility] Group Notes (Signed)
LCSW Group Therapy Note 04/08/2020 2:45pm  Type of Therapy and Topic:  Group Therapy:  Communication  Participation Level:  Active  Description of Group: Patients will identify how individuals communicate with one another appropriately and inappropriately.  Patients will be guided to discuss their thoughts, feelings and behaviors related to barriers when communicating.  The group will process together ways to execute positive and appropriate communication with attention given to how one uses behavior, tone and body language.  Patients will be encouraged to reflect on a situation where they were successfully able to communicate and what made this example successful.  Group will identify specific changes they are motivated to make in order to overcome communication barriers with self, peers, authority, and parents.  This group will be process-oriented with patients participating in exploration of their own experiences, giving and receiving support, and challenging self and other group members.   Therapeutic Goals 1. Patient will identify how people communicate (body language, facial expression, and electronics).  Group will also discuss tone, voice and how these impact what is communicated and what is received. 2. Patient will identify feelings (such as fear or worry), thought process and behaviors related to why people internalize feelings rather than express self openly. 3. Patient will identify two changes they are willing to make to overcome communication barriers 4. Members will then practice through role play how to communicate using I statements, I feel statements, and acknowledging feelings rather than displacing feelings on others  Summary of Patient Progress: Pt presents with appropriate mood and affect. During check-ins she describes her mood as "worried, I just got here and I did something so stupid. I'm going to miss graduating next week." She shares two factors that make it difficult for  others to communicate with her. When I hear something I don't like I start to argue or tune them out. I do this because I become irritated. Reasons why she internalizes thoughts/feelings instead of openly expressing them are when I get mad, sad or irritated I tend to shutdown and ignore them. These feelings come from thing not going my way. Two changes she is willing to make to overcome communication barriers are hear people completely out instead of shutting them out. Say how I feel in a positive and respectful way. These changes will positively impact her mental health by I will actually be having a conversation with someone.   Therapeutic Modalities Cognitive Behavioral Therapy Motivational Interviewing Solution Focused Therapy  Karin Lieu Jerrion Tabbert, LCSW 04/08/2020 4:33 PM   Catheryn Slifer S. Loic Hobin,MSW, LCSW [facility]: Child and Adolescent  [phone]

## 2020-04-09 NOTE — [facility] Group Notes (Signed)
LCSW Group Therapy Note   04/09/2020 2:45pm   Type of Therapy and Topic:  Group Therapy:  Overcoming Obstacles   Participation Level:  Active   Description of Group:   In this group patients will be encouraged to explore what they see as obstacles to their own wellness and recovery. They will be guided to discuss their thoughts, feelings, and behaviors related to these obstacles. The group will process together ways to cope with barriers, with attention given to specific choices patients can make. Each patient will be challenged to identify changes they are motivated to make in order to overcome their obstacles. This group will be process-oriented, with patients participating in exploration of their own experiences, giving and receiving support, and processing challenge from other group members.   Therapeutic Goals: 1. Patient will identify personal and current obstacles as they relate to admission. 2. Patient will identify barriers that currently interfere with their wellness or overcoming obstacles.  3. Patient will identify feelings, thought process and behaviors related to these barriers. 4. Patient will identify two changes they are willing to make to overcome these obstacles:      Summary of Patient Progress Pt presents with appropriate mood and affect. During check-ins she describes her mood as "happy I am in a good mood." Pt discusses her current obstacle, relationships. Her body naturally reacts to this obstacle with body clenches and crying. When faced with this obstacle she usually I beg them to talk to me, fight and yell. Her triggers are when they act like they don't care, when they say hurtful things and they don't listen to me. Healthy coping skills she can utilize to overcome this are deep breathing, walking and changing the scenery. What motivates her to overcome the obstacle/what would be better in her life if she overcame is realizing my worth.      Therapeutic Modalities:    Cognitive Behavioral Therapy Solution Focused Therapy Motivational Interviewing Relapse Prevention Therapy  Tamara Guzman Tamara Eddy Liszewski, LCSW 04/09/2020 4:38 PM   Tamara Guzman Tamara Guzman, MSW, LCSW [facility]: Child and Adolescent  [phone]

## 2020-04-09 NOTE — [facility] Counselor (Signed)
CSW completed PSA with patient as she is 19 years old. She provided consent for pt to speak with mother regarding SPE and discharge planning/process. Pt is interested in a new therapist. She will share preferences with writer tomorrow in the event referrals need to be made. Writer will call mother to complete SPE and discharge plan/process.   Ryman Rathgeber S. Vianney Kopecky, LCSWA, MSW [facility]: Child and Adolescent  [phone]

## 2020-04-09 NOTE — Plan of Care (Signed)
Spoke with patient 1:1 patient states that she needs to talk 1:1 with staff to help her figure out how to negotiate the problems in her life. She feels she cant talk to her mom because mom becomes too emotional and then patient finds her self comforting her mother and doesn't get her needs met. Aunt is a strong support for patient.  Problem: Education: Goal: Utilization of techniques to improve thought processes will improve Outcome: Progressing   Problem: Activity: Goal: Imbalance in normal sleep/wake cycle will improve Outcome: Progressing   Problem: Coping: Goal: Coping ability will improve Outcome: Progressing

## 2020-04-09 NOTE — Progress Notes (Signed)
Recreation Therapy Notes  Date: 04/09/2020 Time: 10:30- 11:15 am Location:  100 hall day room  Group Topic: Passing Judgments, Power of Communication  Goal Area(s) Addresses:  Patient will effectively work with peer towards shared goal.  Patient will identify any observations made during group. Patient will identify characteristics you can visually see about a person.  Patient will identify characteristics that are not visual about a person.  Patient will follow directions on first prompt.  Behavioral Response: minimal  Intervention: Financial controller  Activity: Patients and LRT discussed group rules and then introduced the group topic.  Writer and Patients talked about the characteristics in a person and which ones are visual and characteristics that you may not be able to see. This conversation was lead and compared to an iceberg, and how there are visual qualities you can see on a person, and things that are "hidden" and not visible. Patients then played a game of cross the line where they were given the opportunity to step across the line if the statement applied to them. Patients then were asked about their observations and judgments made during the game.  Patients were debriefed on how easy it is to judge someone, without knowing their history, past, or reasoning. The objective was to teach patients to be more mindful when commenting and communicating with others about their life and decisions.   Education: Pharmacist, community, Scientist, physiological, Discharge Planning   Education Outcome: Acknowledges education.   Clinical Observations/Feedback: Patient appeared tired as seen by patient sitting arms crossed eyes closed during group. Writer tried to involve patient in group conversation to prevent from sleeping or being off task. Patient was minimal with responses.    Deidre Ala, LRT/CTRS         Tamara Guzman L Tamara Guzman 04/09/2020 2:29 PM

## 2020-04-09 NOTE — Plan of Care (Signed)
Calm and cooperative but reporting depression. Stayed in the milieu and was appropriate toward staff and peers. Received bedtime medication and currently sleeing. Safety monitored as recommended.
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
## 2020-04-09 NOTE — Progress Notes (Signed)
Patient attended the evening group session and answered all discussion questions prompted from this Clinical research associate. Patient shared his goal for the day was to work on communication with family. Patient rated her day a 4 out of 10 and her affect was appropriate.

## 2020-04-09 NOTE — Progress Notes (Signed)
Patient ID: Tamara Guzman, female   DOB: 2001/06/14, 19 y.o.   MRN: [id number] [place] NOVEL CORONAVIRUS (COVID-19) DAILY CHECK-OFF SYMPTOMS - answer yes or no to each - every day NO YES  Have you had a fever in the past 24 hours?  . Fever (Temp > 37.80C / 100F) X   Have you had any of these symptoms in the past 24 hours? . New Cough .  Sore Throat  .  Shortness of Breath .  Difficulty Breathing .  Unexplained Body Aches   X   Have you had any one of these symptoms in the past 24 hours not related to allergies?   . Runny Nose .  Nasal Congestion .  Sneezing   X   If you have had runny nose, nasal congestion, sneezing in the past 24 hours, has it worsened?  X   EXPOSURES - check yes or no X   Have you traveled outside the state in the past 14 days?  X   Have you been in contact with someone with a confirmed diagnosis of COVID-19 or PUI in the past 14 days without wearing appropriate PPE?  X   Have you been living in the same home as a person with confirmed diagnosis of COVID-19 or a PUI (household contact)?    X   Have you been diagnosed with COVID-19?    X              What to do next: Answered NO to all: Answered YES to anything:   Proceed with unit schedule Follow the [facility] Inpatient Flowsheet.

## 2020-04-09 NOTE — Progress Notes (Signed)
[facility] MD Progress Note  04/09/2020 9:47 AM Tamara Guzman  MRN:  [id number]  Subjective:  "I am worried about my graduation and inquiry about discharge date and time."  Patient was seen by this MD, chart reviewed and case discussed with treatment team.  In brief this is a 19 years old female with history of bipolar disorder and ADHD and anxiety but noncompliant with medication since he become 19 years old and came to the hospital with suicidal attempt by intentional overdose of several medication including Cipro, Latuda and clonidine required medical stabilization in [facility].  On evaluation the patient reported: Patient appeared depression, increased anxiety, rumination worried about graduation at the school and also taking her placement test and seeking for possible early discharge.  Patient reports she has been taking her medication prescribed to her and reportedly no nausea vomiting, headache or stomach pain or other somatic problems.  Patient reported she slept fine even though it is hard to fall into sleep and her goal is staying positive in her thoughts and improving her communication.  Patient reported no dizziness or hypotension.  Patient reported family came and talked to her about her school how she disappointed herself and disappointed in her family.  Patient reported she told her mom sorry for disappointing and mom stated that she is worried about her suicidal attempt.  Patient endorses her depression 1 out of 10, anxiety is 5 out of 10, anger is 0 out of 10, 10 being the highest severity.  Patient has been actively participating in therapeutic milieu, group activities and learning coping skills to control emotional difficulties including depression and anxiety.  The patient has no reported irritability, agitation or aggressive behavior.  Patient has been sleeping and eating well without any difficulties.  Patient has been taking medication, tolerating well without side effects of  the medication including GI upset or mood activation.    Principal Problem: MDD (major depressive disorder), severe (HCC) Diagnosis: Principal Problem:   MDD (major depressive disorder), severe (HCC) Active Problems:   Post traumatic stress disorder (PTSD)   Drug overdose, multiple drugs, intentional self-harm, initial encounter (HCC)  Total Time spent with patient: 30 minutes  Past Psychiatric History: Bipolar depression, anxiety and ADHD.  Mom was reobtained her custody in August 2020.  Patient was in multiple placements including foster homes, group homes in [place] and recently was placed in [facility] x 2 weeks.  He has multiple psychiatric hospitalization including recent admission to the [facility] in [place].    Her past medications are Latuda 40 mg, 60mg , Clonidine 0.1 mg Qhs, vitamine D.  Patient stopped all her medications when she become 19 years old.  Patient discontinued Seroquel as it is making her drowsy and not able to participate in school work.  .   Her current [facility] access care - started a month ago for medication and no therapist. She was 15 from August to January 2021.  Past Medical History:  Past Medical History:  Diagnosis Date  . ADHD   . Anxiety   . Bipolar 1 disorder (HCC)   . Depression   . Obesity    History reviewed. No pertinent surgical history. Family History: History reviewed. No pertinent family history. Family Psychiatric  History: Mother has anxiety and bipolar disorder. She has no current medication.  Social History:  Social History   Substance and Sexual Activity  Alcohol Use Not Currently     Social History   Substance and  Sexual Activity  Drug Use Yes  . Types: Marijuana    Social History   Socioeconomic History  . Marital status: Single    Spouse name: Not on file  . Number of children: Not on file  . Years of education: Not on file  . Highest education level: Not on file  Occupational  History  . Not on file  Tobacco Use  . Smoking status: Never Smoker  . Smokeless tobacco: Never Used  Substance and Sexual Activity  . Alcohol use: Not Currently  . Drug use: Yes    Types: Marijuana  . Sexual activity: Yes    Birth control/protection: None  Other Topics Concern  . Not on file  Social History Narrative  . Not on file   Social Determinants of Health   Financial Resource Strain:   . Difficulty of Paying Living Expenses:   Food Insecurity:   . Worried About Charity fundraiser in the Last Year:   . Arboriculturist in the Last Year:   Transportation Needs:   . Film/video editor (Medical):   Marland Kitchen Lack of Transportation (Non-Medical):   Physical Activity:   . Days of Exercise per Week:   . Minutes of Exercise per Session:   Stress:   . Feeling of Stress :   Social Connections:   . Frequency of Communication with Friends and Family:   . Frequency of Social Gatherings with Friends and Family:   . Attends Religious Services:   . Active Member of Clubs or Organizations:   . Attends Archivist Meetings:   Marland Kitchen Marital Status:    Additional Social History:                         Sleep: Fair -complaining about initial insomnia  Appetite:  Good  Current Medications: Current Facility-Administered Medications  Medication Dose Route Frequency Provider Last Rate Last Admin  . buPROPion (WELLBUTRIN XL) 24 hr tablet 150 mg  150 mg Oral Daily Ambrose Finland, MD   150 mg at 04/09/20 0800  . hydrOXYzine (ATARAX/VISTARIL) tablet 25 mg  25 mg Oral QHS PRN,MR X 1 Ambrose Finland, MD   25 mg at 04/08/20 2017  . OXcarbazepine (TRILEPTAL) tablet 150 mg  150 mg Oral BID Ambrose Finland, MD   150 mg at 04/09/20 0800    Lab Results:  Results for orders placed or performed during the hospital encounter of 04/07/20 (from the past 48 hour(s))  Glucose, capillary     Status: Abnormal   Collection Time: 04/08/20  6:42 AM  Result  Value Ref Range   Glucose-Capillary 102 (H) 70 - 99 mg/dL    Comment: Glucose reference range applies only to samples taken after fasting for at least 8 hours.  Glucose, capillary     Status: None   Collection Time: 04/08/20 11:31 AM  Result Value Ref Range   Glucose-Capillary 99 70 - 99 mg/dL    Comment: Glucose reference range applies only to samples taken after fasting for at least 8 hours.    Blood Alcohol level:  Lab Results  Component Value Date   [facility] <10 04/05/2020   ETH <10 40/98/1191    Metabolic Disorder Labs: Lab Results  Component Value Date   HGBA1C 5.7 (H) 04/05/2020   MPG 116.89 04/05/2020   MPG 108.28 07/09/2018   Lab Results  Component Value Date   PROLACTIN 1.5 (L) 07/09/2018   Lab Results  Component Value  Date   CHOL 161 07/09/2018   TRIG 60 07/09/2018   HDL 52 07/09/2018   CHOLHDL 3.1 07/09/2018   VLDL 12 07/09/2018   LDLCALC 97 07/09/2018    Physical Findings: AIMS: Facial and Oral Movements Muscles of Facial Expression: None, normal Lips and Perioral Area: None, normal Jaw: None, normal Tongue: None, normal,Extremity Movements Upper (arms, wrists, hands, fingers): None, normal Lower (legs, knees, ankles, toes): None, normal, Trunk Movements Neck, shoulders, hips: None, normal, Overall Severity Severity of abnormal movements (highest score from questions above): None, normal Incapacitation due to abnormal movements: None, normal Patient's awareness of abnormal movements (rate only patient's report): No Awareness, Dental Status Current problems with teeth and/or dentures?: No Does patient usually wear dentures?: No  CIWA:  CIWA-Ar Total: 0 COWS:  COWS Total Score: 1  Musculoskeletal: Strength & Muscle Tone: within normal limits Gait & Station: normal Patient leans: N/A  Psychiatric Specialty Exam: Physical Exam  Review of Systems  Blood pressure (!) 78/49, pulse (!) 114, temperature 98.6 F (37 C), resp. rate 14, height 5\' 1"  (1.549  m), weight 105.5 kg, SpO2 100 %.Body mass index is 43.95 kg/m.  General Appearance: Casual  Eye Contact:  Good  Speech:  Clear and Coherent  Volume:  Normal  Mood:  Anxious and Depressed  Affect:  Constricted and Depressed  Thought Process:  Coherent, Goal Directed and Descriptions of Associations: Intact  Orientation:  Full (Time, Place, and Person)  Thought Content:  Logical  Suicidal Thoughts:  No  Homicidal Thoughts:  No  Memory:  Immediate;   Fair Recent;   Fair Remote;   Fair  Judgement:  Impaired  Insight:  Fair  Psychomotor Activity:  Decreased  Concentration:  Concentration: Fair and Attention Span: Fair  Recall:  Good  Fund of Knowledge:  Good  Language:  Good  Akathisia:  Negative  Handed:  Right  AIMS (if indicated):     Assets:  Communication Skills Desire for Improvement Financial Resources/Insurance Housing Leisure Time Physical Health Resilience Social Support Talents/Skills Transportation Vocational/Educational  ADL's:  Intact  Cognition:  WNL  Sleep:        Treatment Plan Summary: Patient admitted with the overdose of multiple drugs including clonidine as a suicide attempt and known for noncompliant with the medication since he become 19 years old.  He had a multiple out-of-home placements and came to mom's custody recently.  Patient complaining about initial insomnia and increased anxiety regarding school graduation and placement test.  Daily contact with patient to assess and evaluate symptoms and progress in treatment and Medication management 1. Will maintain Q 15 minutes observation for safety. Estimated LOS: 5-7 days 2. Reviewed admission labs: CMP-CO2 20 glucose 100, CBC hemoglobin hematocrit and platelets are within normal limits, acetaminophen, salicylates and ethylalcohol-nontoxic, viral tests are negative, HIV test is nonreactive, urine analysis positive for ketones 15 and urine pregnancy test is negative and urine tox screen positive for  tetrahydrocannabinol and EKG 12-lead-sinus bradycardia secondary to clonidine overdose 3. Patient will participate in group, milieu, and family therapy. Psychotherapy: Social and 15, anti-bullying, learning based strategies, cognitive behavioral, and family object relations individuation separation intervention psychotherapies can be considered.  4. Depression: not improving : Monitor response to initiated dose of Wellbutrin XL 150  mg daily for depression.  5. DMDD: not improving: Monitor response to initiated Trileptal 150 mg BID which can be titrated if clinically tolerated and required 6. Anxiety: Not improving; monitor response to continuation of Vistaril 25 mg  qhs prn and repeat x 1 as needed 7. Will continue to monitor patient's mood and behavior. 8. Social Work will schedule a Family meeting to obtain collateral information and discuss discharge and follow up plan.  9. Discharge concerns will also be addressed: Safety, stabilization, and access to medication  Leata Mouse, MD 04/09/2020, 9:47 AM

## 2020-04-09 NOTE — Tx Team (Signed)
Interdisciplinary Treatment and Diagnostic Plan Update  04/09/2020 Time of Session: 10am Tamara Guzman MRN: [id number]  Principal Diagnosis: MDD (major depressive disorder), severe ([place])  Secondary Diagnoses: Principal Problem:   MDD (major depressive disorder), severe ([place]) Active Problems:   Post traumatic stress disorder (PTSD)   Drug overdose, multiple drugs, intentional self-harm, initial encounter ([name])   Current Medications:  Current Facility-Administered Medications  Medication Dose Route Frequency Provider Last Rate Last Admin  . buPROPion (WELLBUTRIN XL) 24 hr tablet 150 mg  150 mg Oral Daily Ambrose Finland, MD   150 mg at 04/09/20 0800  . hydrOXYzine (ATARAX/VISTARIL) tablet 25 mg  25 mg Oral QHS PRN,MR X 1 Ambrose Finland, MD   25 mg at 04/08/20 2017  . OXcarbazepine (TRILEPTAL) tablet 150 mg  150 mg Oral BID Ambrose Finland, MD   150 mg at 04/09/20 0800   PTA Medications: Medications Prior to Admission  Medication Sig Dispense Refill Last Dose  . hydrOXYzine (ATARAX/VISTARIL) 25 MG tablet Take 1 tablet (25 mg total) by mouth 3 (three) times daily as needed for anxiety (agitation). 30 tablet 0 Unknown at Unknown time    Patient Stressors:    Patient Strengths:    Treatment Modalities: Medication Management, Group therapy, Case management,  1 to 1 session with clinician, Psychoeducation, Recreational therapy.   Physician Treatment Plan for Primary Diagnosis: MDD (major depressive disorder), severe ([name]) Long Term Goal(s): Improvement in symptoms so as ready for discharge Improvement in symptoms so as ready for discharge   Short Term Goals: Ability to identify changes in lifestyle to reduce recurrence of condition will improve Ability to verbalize feelings will improve Ability to disclose and discuss suicidal ideas Ability to demonstrate self-control will improve Ability to identify and develop effective coping behaviors will  improve Ability to maintain clinical measurements within normal limits will improve Compliance with prescribed medications will improve Ability to identify triggers associated with substance abuse/mental health issues will improve  Medication Management: Evaluate patient's response, side effects, and tolerance of medication regimen.  Therapeutic Interventions: 1 to 1 sessions, Unit Group sessions and Medication administration.  Evaluation of Outcomes: Progressing  Physician Treatment Plan for Secondary Diagnosis: Principal Problem:   MDD (major depressive disorder), severe ([place]) Active Problems:   Post traumatic stress disorder (PTSD)   Drug overdose, multiple drugs, intentional self-harm, initial encounter ([name])  Long Term Goal(s): Improvement in symptoms so as ready for discharge Improvement in symptoms so as ready for discharge   Short Term Goals: Ability to identify changes in lifestyle to reduce recurrence of condition will improve Ability to verbalize feelings will improve Ability to disclose and discuss suicidal ideas Ability to demonstrate self-control will improve Ability to identify and develop effective coping behaviors will improve Ability to maintain clinical measurements within normal limits will improve Compliance with prescribed medications will improve Ability to identify triggers associated with substance abuse/mental health issues will improve     Medication Management: Evaluate patient's response, side effects, and tolerance of medication regimen.  Therapeutic Interventions: 1 to 1 sessions, Unit Group sessions and Medication administration.  Evaluation of Outcomes: Progressing   RN Treatment Plan for Primary Diagnosis: MDD (major depressive disorder), severe ([name]) Long Term Goal(s): Knowledge of disease and therapeutic regimen to maintain health will improve  Short Term Goals: Ability to remain free from injury will improve, Ability to verbalize frustration  and anger appropriately will improve, Ability to demonstrate self-control, Ability to verbalize feelings will improve, Ability to disclose and discuss suicidal ideas, Ability  to identify and develop effective coping behaviors will improve and Compliance with prescribed medications will improve  Medication Management: RN will administer medications as ordered by provider, will assess and evaluate patient's response and provide education to patient for prescribed medication. RN will report any adverse and/or side effects to prescribing provider.  Therapeutic Interventions: 1 on 1 counseling sessions, Psychoeducation, Medication administration, Evaluate responses to treatment, Monitor vital signs and CBGs as ordered, Perform/monitor CIWA, COWS, AIMS and Fall Risk screenings as ordered, Perform wound care treatments as ordered.  Evaluation of Outcomes: Progressing   LCSW Treatment Plan for Primary Diagnosis: MDD (major depressive disorder), severe (HCC) Long Term Goal(s): Safe transition to appropriate next level of care at discharge, Engage patient in therapeutic group addressing interpersonal concerns.  Short Term Goals: Engage patient in aftercare planning with referrals and resources, Increase ability to appropriately verbalize feelings, Increase emotional regulation, Identify triggers associated with mental health/substance abuse issues and Increase skills for wellness and recovery  Therapeutic Interventions: Assess for all discharge needs, 1 to 1 time with Social worker, Explore available resources and support systems, Assess for adequacy in community support network, Educate family and significant other(s) on suicide prevention, Complete Psychosocial Assessment, Interpersonal group therapy.  Evaluation of Outcomes: Progressing   Progress in Treatment: Attending groups: Yes. Participating in groups: Yes. Taking medication as prescribed: Yes. Toleration medication: Yes. Family/Significant  other contact made: No, will contact:  Pt is 18 and has to give consent for CSW to speak with parent/guardian Patient understands diagnosis: Yes. Discussing patient identified problems/goals with staff: Yes. Medical problems stabilized or resolved: Yes. Denies suicidal/homicidal ideation: As evidenced by:  Contracts for safety on the unit Issues/concerns per patient self-inventory: No. Other: N/A  New problem(s) identified: No, Describe:  None reported  New Short Term/Long Term Goal(s):Safe transition to appropriate next level of care at discharge, Engage patient in therapeutic group addressing interpersonal concerns.   Short Term Goals: Engage patient in aftercare planning with referrals and resources, Increase ability to appropriately verbalize feelings, Increase emotional regulation and Increase skills for wellness and recovery  Patient Goals: "Number one opening up to people and letting people in. I do not talk a lot or open up. I keep my feelings in and balled up. I want to work on communication."  Discharge Plan or Barriers: Pt to return to parent/guardian care and follow up with outpatient therapy and medication management  Reason for Continuation of Hospitalization: Depression Medication stabilization Suicidal ideation  Estimated Length of Stay:04/14/2020  Attendees: Patient:Tamara Guzman  04/09/2020 8:26 AM  Physician: [name] 04/09/2020 8:26 AM  Nursing: Mordecai Rasmussen, RN 04/09/2020 8:26 AM  RN Care Manager: 04/09/2020 8:26 AM  Social Worker: Nedra Hai, MSW, LCSW 04/09/2020 8:26 AM  Recreational Therapist: Pat Patrick, LRT 04/09/2020 8:26 AM  Other:  04/09/2020 8:26 AM  Other:  04/09/2020 8:26 AM  Other: 04/09/2020 8:26 AM    Scribe for Treatment Team: Ovid Witman S Kordell Jafri, LCSW 04/09/2020 8:26 AM   Mesha Schamberger S. Deyjah Kindel, MSW, LCSW [facility]: [facility] and Adolescent  [phone]

## 2020-04-10 MED ORDER — HYDROXYZINE HCL 50 MG PO TABS: 50.0000 mg | ORAL_TABLET | Freq: Every evening | ORAL | Status: DC | PRN

## 2020-04-10 MED ORDER — HYDROXYZINE HCL 50 MG PO TABS
50.0000 mg | ORAL_TABLET | Freq: Every day | ORAL | Status: DC
  Administered 2020-04-10 – 2020-04-12 (×3): 50 mg via ORAL
  Filled 2020-04-10 (×7): qty 1

## 2020-04-10 MED ORDER — HYDROXYZINE HCL 25 MG PO TABS
ORAL_TABLET | ORAL | Status: AC
  Filled 2020-04-10: qty 1

## 2020-04-10 MED ORDER — OXCARBAZEPINE 300 MG PO TABS
300.0000 mg | ORAL_TABLET | Freq: Two times a day (BID) | ORAL | Status: DC
  Administered 2020-04-10 – 2020-04-13 (×6): 300 mg via ORAL
  Filled 2020-04-10 (×12): qty 1

## 2020-04-10 NOTE — Progress Notes (Signed)
[facility] MD Progress Note  04/10/2020 9:54 AM Tamara Guzman  MRN:  [id number]  Subjective:  "No body listen when I am communicating and not able to sleep."  In brief this is a 19 years old female with bipolar disorder, ADHD, anxiety but noncompliant with medication since 19 years old. She was admitted to the hospital due to suicidal attempt, took Cipro, Latuda and clonidine required medical stabilization in [facility].  On evaluation the patient reported: Patient appeared awake, alert, oriented to place person and time reportedly could not sleep and walking in the hallway and trying to hang around with the nursing station.  Patient reported she slept about 2 to 3 hours at night and is taking daytime naps.  Patient reported goal is improving communication with her mother and able to handle her depression and sadness with the coping skills.  Patient also reports she is not good at expressing her negative thoughts.  Patient reportedly participating group therapeutic activities where they discussed about obstacles for mental health.  Patient stated her triggers are people do not listen to her.  Patient reported her mom has plans to visit her today and she is planning to talk to her and ask her how she has been feeling.  Patient reported medications working and no somatic complaints.  Patient has no GI upset or mood activation.  Patient reported sleep is not good her appetite has been not good because of the taste of the food in the cafeteria, no suicidal homicidal ideation.  Patient rated her depression 3 out of 10, anxiety 4 out of 10 and anger is 0 out of 10, 10 being the highest severity.  Patient contract for safety while being in hospital.     Principal Problem: MDD (major depressive disorder), severe (HCC) Diagnosis: Principal Problem:   MDD (major depressive disorder), severe (HCC) Active Problems:   Post traumatic stress disorder (PTSD)   Drug overdose, multiple drugs, intentional  self-harm, initial encounter (HCC)  Total Time spent with patient: 30 minutes  Past Psychiatric History: Bipolar depression, anxiety and ADHD.  Mom was reobtained her custody in August 2020.  Patient was in multiple placements including foster homes, group homes in [place] and recently was placed in [facility] x 2 weeks.  He has multiple psychiatric hospitalization including recent admission to the [facility] in [place].    Her past medications are Latuda 40 mg, 60mg , Clonidine 0.1 mg Qhs, vitamine D.  Patient stopped all her medications when she become 19 years old.  Patient discontinued Seroquel as it is making her drowsy and not able to participate in school work.  .   Her current [facility] access care - started a month ago for medication and no therapist. She was 15 from August to January 2021.  Past Medical History:  Past Medical History:  Diagnosis Date  . ADHD   . Anxiety   . Bipolar 1 disorder (HCC)   . Depression   . Obesity    History reviewed. No pertinent surgical history. Family History: History reviewed. No pertinent family history. Family Psychiatric  History: Mother has anxiety and bipolar disorder. She has no current medication.  Social History:  Social History   Substance and Sexual Activity  Alcohol Use Not Currently     Social History   Substance and Sexual Activity  Drug Use Yes  . Types: Marijuana    Social History   Socioeconomic History  . Marital status: Single    Spouse  name: Not on file  . Number of children: Not on file  . Years of education: Not on file  . Highest education level: Not on file  Occupational History  . Not on file  Tobacco Use  . Smoking status: Never Smoker  . Smokeless tobacco: Never Used  Substance and Sexual Activity  . Alcohol use: Not Currently  . Drug use: Yes    Types: Marijuana  . Sexual activity: Yes    Birth control/protection: None  Other Topics Concern  . Not on file  Social  History Narrative  . Not on file   Social Determinants of Health   Financial Resource Strain:   . Difficulty of Paying Living Expenses:   Food Insecurity:   . Worried About Programme researcher, broadcasting/film/video in the Last Year:   . Barista in the Last Year:   Transportation Needs:   . Freight forwarder (Medical):   Marland Kitchen Lack of Transportation (Non-Medical):   Physical Activity:   . Days of Exercise per Week:   . Minutes of Exercise per Session:   Stress:   . Feeling of Stress :   Social Connections:   . Frequency of Communication with Friends and Family:   . Frequency of Social Gatherings with Friends and Family:   . Attends Religious Services:   . Active Member of Clubs or Organizations:   . Attends Banker Meetings:   Marland Kitchen Marital Status:    Additional Social History:                         Sleep: Fair -slept up to 3 hours last night and taking daytime naps  Appetite:  Good  Current Medications: Current Facility-Administered Medications  Medication Dose Route Frequency Provider Last Rate Last Admin  . buPROPion (WELLBUTRIN XL) 24 hr tablet 150 mg  150 mg Oral Daily Leata Mouse, MD   150 mg at 04/10/20 0847  . hydrOXYzine (ATARAX/VISTARIL) tablet 50 mg  50 mg Oral QHS PRN,MR X 1 [name], MD      . OXcarbazepine (TRILEPTAL) tablet 150 mg  150 mg Oral BID Leata Mouse, MD   150 mg at 04/10/20 5929    Lab Results:  Results for orders placed or performed during the hospital encounter of 04/07/20 (from the past 48 hour(s))  Glucose, capillary     Status: None   Collection Time: 04/08/20 11:31 AM  Result Value Ref Range   Glucose-Capillary 99 70 - 99 mg/dL    Comment: Glucose reference range applies only to samples taken after fasting for at least 8 hours.    Blood Alcohol level:  Lab Results  Component Value Date   ETH <10 04/05/2020   ETH <10 05/03/2018    Metabolic Disorder Labs: Lab Results  Component  Value Date   HGBA1C 5.7 (H) 04/05/2020   MPG 116.89 04/05/2020   MPG 108.28 07/09/2018   Lab Results  Component Value Date   PROLACTIN 1.5 (L) 07/09/2018   Lab Results  Component Value Date   CHOL 161 07/09/2018   TRIG 60 07/09/2018   HDL 52 07/09/2018   CHOLHDL 3.1 07/09/2018   VLDL 12 07/09/2018   LDLCALC 97 07/09/2018    Physical Findings: AIMS: Facial and Oral Movements Muscles of Facial Expression: None, normal Lips and Perioral Area: None, normal Jaw: None, normal Tongue: None, normal,Extremity Movements Upper (arms, wrists, hands, fingers): None, normal Lower (legs, knees, ankles, toes): None, normal,  Trunk Movements Neck, shoulders, hips: None, normal, Overall Severity Severity of abnormal movements (highest score from questions above): None, normal Incapacitation due to abnormal movements: None, normal Patient's awareness of abnormal movements (rate only patient's report): No Awareness, Dental Status Current problems with teeth and/or dentures?: No Does patient usually wear dentures?: No  CIWA:  CIWA-Ar Total: 0 COWS:  COWS Total Score: 1  Musculoskeletal: Strength & Muscle Tone: within normal limits Gait & Station: normal Patient leans: N/A  Psychiatric Specialty Exam: Physical Exam  Review of Systems  Blood pressure (!) 116/54, pulse 91, temperature 98.1 F (36.7 C), resp. rate 18, height 5\' 1"  (1.549 m), weight 105.5 kg, SpO2 100 %.Body mass index is 43.95 kg/m.  General Appearance: Casual  Eye Contact:  Good  Speech:  Clear and Coherent  Volume:  Normal  Mood:  Anxious and Depressed-no changes  Affect:  Constricted and Depressed-no changes  Thought Process:  Coherent, Goal Directed and Descriptions of Associations: Intact  Orientation:  Full (Time, Place, and Person)  Thought Content:  Logical  Suicidal Thoughts:  No-denied  Homicidal Thoughts:  No  Memory:  Immediate;   Fair Recent;   Fair Remote;   Fair  Judgement:  Intact  Insight:  Fair   Psychomotor Activity:  Increased  Concentration:  Concentration: Fair and Attention Span: Fair  Recall:  Good  Fund of Knowledge:  Good  Language:  Good  Akathisia:  Negative  Handed:  Right  AIMS (if indicated):     Assets:  Communication Skills Desire for Improvement Financial Resources/Insurance Housing Leisure Time [place] Talents/Skills Transportation Vocational/Educational  ADL's:  Intact  Cognition:  WNL  Sleep:        Treatment Plan Summary: Reviewed current treatment plan on 04/10/2020 Patient complaining about not sleeping well and asking to adjust her medication.  Patient admitted with the overdose of multiple drugs including clonidine as a suicide attempt and known for noncompliant with the medication since he become 19 years old.  He had a multiple out-of-home placements and came to mom's custody recently.  Patient complaining about initial insomnia and increased anxiety regarding school graduation and placement test.  Daily contact with patient to assess and evaluate symptoms and progress in treatment and Medication management 1. Will maintain Q 15 minutes observation for safety. Estimated LOS: 5-7 days 2. Reviewed admission labs: CMP-CO2 20 glucose 100, CBC hemoglobin hematocrit and platelets are within normal limits, acetaminophen, salicylates and ethylalcohol-nontoxic, viral tests are negative, HIV test is nonreactive, urine analysis positive for ketones 15 and urine pregnancy test is negative and urine tox screen positive for tetrahydrocannabinol and EKG 12-lead-sinus bradycardia secondary to clonidine overdose 3. Patient will participate in group, milieu, and family therapy. Psychotherapy: Social and Airline pilot, anti-bullying, learning based strategies, cognitive behavioral, and family object relations individuation separation intervention psychotherapies can be considered.  4. Depression:  No changes  noted: Wellbutrin XL 150  mg daily for depression.  5. DMDD: No changes noted: Monitor response to titrated dose of Trileptal 300 mg BID starting from 04/10/2020. 6. Anxiety/insomnia: Not improving; monitor response to Increase dose of Vistaril 50 mg qhs prn and repeat x 1 as needed 7. Will continue to monitor patient's mood and behavior. 8. Social Work will schedule a Family meeting to obtain collateral information and discuss discharge and follow up plan.  9. Discharge concerns will also be addressed: Safety, stabilization, and access to medication  Ambrose Finland, MD 04/10/2020, 9:54 AM

## 2020-04-10 NOTE — [facility] Group Notes (Signed)
LCSW Group Therapy Note   04/10/2020 2:45pm  Type of Therapy and Topic:  Group Therapy:  Identity and Relationships  Participation Level:  Active  Description of Group: Using the 'Ungame' patients were guided to express themselves about a variety of topics. Selected cards for this game included identity and relationships. Patients were able to discuss dealing with positive and negative situations, identifying supports, and other ways to understand your identity. Patients shared unique viewpoints but often had similar characteristics.  Patients encouraged to use this dialogue to develop goals and supports for future progress.  Therapeutic Goals: 1. Patient will discuss 2 positive and 2 negative situations in their life prior to admission 2. Patient will identify 2 positive support persons in their home environment 3. Patient will explore setting goals for themselves and identify supports they need to achieve these goals 4. Patient will demonstrate empathy for others in the group by responding with positive affirmations of group members shares. Summary of Patient Progress: Pt presents with appropriate mood and affect. During check-ins she describes her mood as "happy I don't know why." Pt participates in group discussion regarding identity and self-esteem. Pt is able to connect identity and self-esteem to how she presents/shows up in relationships. She states "there are parts of me I like and other parts I do not like. I do not think I can change the things I don't like.    Therapeutic Modalities: Motivational Interviewing Cognitive Behavioral Therapy   Vedha Tercero S Joleigh Mineau, LCSW   Pearlina Friedly S. Kanai Hilger, MSW, [facility]: Child and Adolescent  [phone]   04/10/2020 4:22 PM

## 2020-04-10 NOTE — [facility] Suicide Risk Assessment (Signed)
[facility] INPATIENT:  Family/Significant Other Suicide Prevention Education  Suicide Prevention Education:  Education Completed with Mother Cherly Beach has been identified by the patient as the family member/significant other with whom the patient will be residing, and identified as the person(s) who will aid the patient in the event of a mental health crisis (suicidal ideations/suicide attempt).  With written consent from the patient, the family member/significant other has been provided the following suicide prevention education, prior to the and/or following the discharge of the patient.  The suicide prevention education provided includes the following:  Suicide risk factors  Suicide prevention and interventions  National Suicide Hotline telephone number  [facility] assessment telephone number  [facility] Emergency Assistance 911  [facility] and/or Residential Mobile Crisis Unit telephone number  Request made of family/significant other to:  Remove weapons (e.g., guns, rifles, knives), all items previously/currently identified as safety concern.    Remove drugs/medications (over-the-counter, prescriptions, illicit drugs), all items previously/currently identified as a safety concern.  The family member/significant other verbalizes understanding of the suicide prevention education information provided.  The family member/significant other agrees to remove the items of safety concern listed above.  Raejean Swinford S Jamonta Goerner 04/10/2020, 3:33 PM   Tally Mattox S. Elleni Mozingo, MSW, LCSW [facility]: Child and Adolescent  [phone]

## 2020-04-10 NOTE — [facility] Counselor (Signed)
CSW called and spoke with pt's mother. Writer explained SPE and discussed discharge planning/process. During SPE mother verbalized understanding and will make necessary changes. Mother reported that pt is missing a lot of school and graduation is next week. Mother requested to pick pt up at 7am on scheduled discharge date, 04/14/20.   Jaylen Knope S. Cozette Braggs, LCSWA, MSW [facility]: Child and Adolescent  [phone]

## 2020-04-10 NOTE — Progress Notes (Signed)
   04/10/20 0800  Psych Admission Type (Psych Patients Only)  Admission Status Involuntary  Psychosocial Assessment  Patient Complaints Depression  Eye Contact Fair  Facial Expression Flat  Affect Appropriate to circumstance  Speech Slow  Interaction Other (Comment) (appropriate)  Appearance/Hygiene Improved  Behavior Characteristics Cooperative  Mood Depressed  Thought Process  Coherency WDL  Content WDL  Delusions None reported or observed  Perception WDL  Hallucination None reported or observed  Judgment Impaired  Confusion None  Danger to Self  Current suicidal ideation? Denies  Agreement Not to Harm Self Yes  Description of Agreement verbal  Danger to Others  Danger to Others None reported or observed      COVID-19 Daily Checkoff  Have you had a fever (temp > 37.80C/100F)  in the past 24 hours?  No  If you have had runny nose, nasal congestion, sneezing in the past 24 hours, has it worsened? No  COVID-19 EXPOSURE  Have you traveled outside the state in the past 14 days? No  Have you been in contact with someone with a confirmed diagnosis of COVID-19 or PUI in the past 14 days without wearing appropriate PPE? No  Have you been living in the same home as a person with confirmed diagnosis of COVID-19 or a PUI (household contact)? No  Have you been diagnosed with COVID-19? No

## 2020-04-10 NOTE — Progress Notes (Signed)
   04/10/20 0223  Psych Admission Type (Psych Patients Only)  Admission Status Involuntary  Psychosocial Assessment  Patient Complaints Depression  Eye Contact Fair  Facial Expression Flat  Affect Appropriate to circumstance  Speech Slow  Interaction Other (Comment) (appropriate)  Motor Activity Slow  Appearance/Hygiene Improved  Behavior Characteristics Cooperative  Mood Depressed  Thought Process  Coherency WDL  Content WDL  Delusions WDL  Perception WDL  Hallucination None reported or observed  Judgment Impaired  Confusion None  Danger to Self  Current suicidal ideation? Denies  Agreement Not to Harm Self Yes  Description of Agreement verbal  Danger to Others  Danger to Others None reported or observed

## 2020-04-11 NOTE — [facility] Counselor (Signed)
CSW called and spoke with pt's mother. Writer informed her of change in discharge date. Mother will pick pt up at 10AM on 04/13/20.   Anasia Agro S. Kelli Egolf, LCSWA, MSW [facility]: Child and Adolescent  [phone]

## 2020-04-11 NOTE — [facility] Group Notes (Signed)
[facility] LCSW Group Therapy Note   04/11/2020 2:45pm  Type of Therapy and Topic:  Group Therapy:   Emotions and Triggers    Participation Level:  Active  Description of Group: Participants were asked to participate in an assignment that involved exploring more about oneself. Patients were asked to identify things that triggered their emotions about coming into the hospital and think about the physical symptoms they experienced when feeling this way. Pt's were encouraged to identify the thoughts that they have when feeling this way and discuss ways to cope with it.  Therapeutic Goals:   1. Patient will state the definition of an emotion and identify two pleasant and two unpleasant emotions they have experienced. 2. Patient will describe the relationship between thoughts, emotions and triggers.  3. Patient will state the definition of a trigger and identify three triggers prior to this admission.  4. Patient will demonstrate through role play how to use coping skills to deescalate themselves when triggered.  Summary of Patient Progress: Patient identified two pleasant emotions and two unpleasant emotions she/he has experienced. Patient discussed reasons why the emotions are unpleasant. Patient stated the definition of the word trigger and identified 2 triggers that led to her/his hospitalization. Patient discussed how she/he can utilize coping skills to deescalate herself/himself when she/he is triggered. Pt presents with irritable mood and appropriate affect. During check ins she describes her mood as "content and irritated-I know I am going home so I have to use my coping skills." Pt discusses her triggers and coping skills she can use. Triggers are me and my mother not getting along. Also, me and my boyfriend. Her coping skills are I cry, I isolate myself and I get really frustrated.    Therapeutic Modalities: Cognitive Behavioral Therapy Motivational Interviewing    Surveyor, minerals, MSW,  LCSW Clinical Social Work

## 2020-04-11 NOTE — Progress Notes (Signed)
   04/11/20 1100  Psych Admission Type (Psych Patients Only)  Admission Status Involuntary  Psychosocial Assessment  Patient Complaints None  Eye Contact Brief  Facial Expression Animated  Affect Silly;Labile  Speech Logical/coherent;Loud  Interaction Assertive (Hyperverbal)  Motor Activity Fidgety  Appearance/Hygiene Unremarkable  Behavior Characteristics Cooperative  Mood Pleasant  Thought Process  Coherency WDL  Content WDL  Delusions None reported or observed  Perception WDL  Hallucination None reported or observed  Judgment Poor  Confusion WDL  Danger to Self  Current suicidal ideation? Denies  Danger to Others  Danger to Others None reported or observed  [place] NOVEL CORONAVIRUS (COVID-19) DAILY CHECK-OFF SYMPTOMS - answer yes or no to each - every day NO YES  Have you had a fever in the past 24 hours?  . Fever (Temp > 37.80C / 100F) X   Have you had any of these symptoms in the past 24 hours? . New Cough .  Sore Throat  .  Shortness of Breath .  Difficulty Breathing .  Unexplained Body Aches   X   Have you had any one of these symptoms in the past 24 hours not related to allergies?   . Runny Nose .  Nasal Congestion .  Sneezing   X   If you have had runny nose, nasal congestion, sneezing in the past 24 hours, has it worsened?  X   EXPOSURES - check yes or no X   Have you traveled outside the state in the past 14 days?  X   Have you been in contact with someone with a confirmed diagnosis of COVID-19 or PUI in the past 14 days without wearing appropriate PPE?  X   Have you been living in the same home as a person with confirmed diagnosis of COVID-19 or a PUI (household contact)?    X   Have you been diagnosed with COVID-19?    X              What to do next: Answered NO to all: Answered YES to anything:   Proceed with unit schedule Follow the [facility] Inpatient Flowsheet.

## 2020-04-11 NOTE — Progress Notes (Signed)
[facility] MD Progress Note  04/11/2020 9:49 AM Tamara Guzman  MRN:  [id number]  Subjective:  "My day was good, participated in grief and loss activity and also social work group yesterday.  My goal is learning coping skills for depression which have been still working on it."  In brief this is a 19 years old female with bipolar disorder, ADHD, anxiety but noncompliant with medication since 19 years old. She was admitted to the hospital due to suicidal attempt, took Cipro, Latuda and clonidine required medical stabilization in [facility].  On evaluation the patient reported: Patient appeared feeling much better since yesterday, reportedly slept throughout the night with the medication adjustment.  Patient reports depression 3 out of 10, anxiety continue to be 8 out of 10, anger is 8 out of 10.  Patient reported appetite is good but could not eat because of the taste of the food.  Patient had a suicidal thoughts prior to admission to the hospital but denies current suicidal thoughts and homicidal thoughts.  Patient mother has been visiting her talking about communication and medication.  Patient reports she is opening up her reactions and mom become emotional which patient does not like it.  Patient reported current coping skills are art, painting, drawing and coloring and also learning about new coping skills that works for her.  Patient talks about grief and loss and talked about how her best friend died made her feel bad, last hope in herself, felt alone hard to open up, do not want to be close to the other people with the fear of being abandoned again.  Staff RN reported patient has more talkative/loquacious but no pressured speech or manic symptoms..  Patient reported she has been positively responding to her mood stabilization Trileptal and sleep and anxiety medication hydroxyzine and also less depressed since started taking Wellbutrin XL 150 mg.  Patient has no reported adverse effect of  the medication including GI upset or mood activation.   Principal Problem: MDD (major depressive disorder), severe (HCC) Diagnosis: Principal Problem:   MDD (major depressive disorder), severe (HCC) Active Problems:   Post traumatic stress disorder (PTSD)   Drug overdose, multiple drugs, intentional self-harm, initial encounter (HCC)  Total Time spent with patient: 20 minutes  Past Psychiatric History: Bipolar depression, anxiety and ADHD.  Mom was reobtained her custody in August 2020.  Patient was in multiple placements including foster homes, group homes in [place] and recently was placed in [facility] x 2 weeks.  He has multiple psychiatric hospitalization including recent admission to the [facility] in [place].    Past medications: Latuda 40 mg, 60mg , Clonidine 0.1 mg Qhs, vitamine D.  Patient stopped all her medications when she become 19 years old.  Patient discontinued Seroquel as it is making her drowsy and not able to participate in school work.  .   Her current [facility] - started a month ago for medication and no therapist. She was 15 from August to January 2021.  Past Medical History:  Past Medical History:  Diagnosis Date  . ADHD   . Anxiety   . Bipolar 1 disorder (HCC)   . Depression   . Obesity    History reviewed. No pertinent surgical history. Family History: History reviewed. No pertinent family history. Family Psychiatric  History: Mother has anxiety and bipolar disorder. She has no current medication.  Social History:  Social History   Substance and Sexual Activity  Alcohol Use Not Currently  Social History   Substance and Sexual Activity  Drug Use Yes  . Types: Marijuana    Social History   Socioeconomic History  . Marital status: Single    Spouse name: Not on file  . Number of children: Not on file  . Years of education: Not on file  . Highest education level: Not on file  Occupational History  . Not  on file  Tobacco Use  . Smoking status: Never Smoker  . Smokeless tobacco: Never Used  Substance and Sexual Activity  . Alcohol use: Not Currently  . Drug use: Yes    Types: Marijuana  . Sexual activity: Yes    Birth control/protection: None  Other Topics Concern  . Not on file  Social History Narrative  . Not on file   Social Determinants of Health   Financial Resource Strain:   . Difficulty of Paying Living Expenses:   Food Insecurity:   . Worried About Charity fundraiser in the Last Year:   . Arboriculturist in the Last Year:   Transportation Needs:   . Film/video editor (Medical):   Marland Kitchen Lack of Transportation (Non-Medical):   Physical Activity:   . Days of Exercise per Week:   . Minutes of Exercise per Session:   Stress:   . Feeling of Stress :   Social Connections:   . Frequency of Communication with Friends and Family:   . Frequency of Social Gatherings with Friends and Family:   . Attends Religious Services:   . Active Member of Clubs or Organizations:   . Attends Archivist Meetings:   Marland Kitchen Marital Status:    Additional Social History:      Sleep: Good  Appetite:  Good  Current Medications: Current Facility-Administered Medications  Medication Dose Route Frequency Provider Last Rate Last Admin  . buPROPion (WELLBUTRIN XL) 24 hr tablet 150 mg  150 mg Oral Daily Ambrose Finland, MD   150 mg at 04/11/20 0845  . hydrOXYzine (ATARAX/VISTARIL) tablet 50 mg  50 mg Oral QHS Ambrose Finland, MD   50 mg at 04/10/20 2021  . Oxcarbazepine (TRILEPTAL) tablet 300 mg  300 mg Oral BID Ambrose Finland, MD   300 mg at 04/11/20 4709    Lab Results:  No results found for this or any previous visit (from the past 48 hour(s)).  Blood Alcohol level:  Lab Results  Component Value Date   ETH <10 04/05/2020   ETH <10 62/83/6629    Metabolic Disorder Labs: Lab Results  Component Value Date   HGBA1C 5.7 (H) 04/05/2020   MPG 116.89  04/05/2020   MPG 108.28 07/09/2018   Lab Results  Component Value Date   PROLACTIN 1.5 (L) 07/09/2018   Lab Results  Component Value Date   CHOL 161 07/09/2018   TRIG 60 07/09/2018   HDL 52 07/09/2018   CHOLHDL 3.1 07/09/2018   VLDL 12 07/09/2018   LDLCALC 97 07/09/2018    Physical Findings: AIMS: Facial and Oral Movements Muscles of Facial Expression: None, normal Lips and Perioral Area: None, normal Jaw: None, normal Tongue: None, normal,Extremity Movements Upper (arms, wrists, hands, fingers): None, normal Lower (legs, knees, ankles, toes): None, normal, Trunk Movements Neck, shoulders, hips: None, normal, Overall Severity Severity of abnormal movements (highest score from questions above): None, normal Incapacitation due to abnormal movements: None, normal Patient's awareness of abnormal movements (rate only patient's report): No Awareness, Dental Status Current problems with teeth and/or dentures?: No Does patient  usually wear dentures?: No  CIWA:  CIWA-Ar Total: 0 COWS:  COWS Total Score: 1  Musculoskeletal: Strength & Muscle Tone: within normal limits Gait & Station: normal Patient leans: N/A  Psychiatric Specialty Exam: Physical Exam  Review of Systems  Blood pressure (!) 117/58, pulse 79, temperature 98.3 F (36.8 C), resp. rate 14, height 5\' 1"  (1.549 m), weight 105.5 kg, SpO2 100 %.Body mass index is 43.95 kg/m.  General Appearance: Casual  Eye Contact:  Good  Speech:  Clear and Coherent, loquacious  Volume:  Normal  Mood:  Anxious and Depressed-slowly improving  Affect:  Constricted and Depressed-brighten on approach  Thought Process:  Coherent, Goal Directed and Descriptions of Associations: Intact  Orientation:  Full (Time, Place, and Person)  Thought Content:  Logical  Suicidal Thoughts:  No-denied  Homicidal Thoughts:  No  Memory:  Immediate;   Fair Recent;   Fair Remote;   Fair  Judgement:  Intact  Insight:  Fair  Psychomotor Activity:   Normal  Concentration:  Concentration: Fair and Attention Span: Fair  Recall:  Good  Fund of Knowledge:  Good  Language:  Good  Akathisia:  Negative  Handed:  Right  AIMS (if indicated):     Assets:  Communication Skills Desire for Improvement Financial Resources/Insurance Housing Leisure Time Physical Health Resilience Social Support Talents/Skills Transportation Vocational/Educational  ADL's:  Intact  Cognition:  WNL  Sleep:        Treatment Plan Summary: Reviewed current treatment plan on 04/11/2020 Patient has been adjusting to her milieu therapy, group therapeutic activities, adjusting to her medication reportedly helping to sleep better and also control her mood swings.  Patient contract for safety without adverse effects.  Patient regrets regarding intentional overdose of medication.  Patient focused on being discharged earlier so that she can participate in school graduation and SAT placement exam.  Daily contact with patient to assess and evaluate symptoms and progress in treatment and Medication management 1. Will maintain Q 15 minutes observation for safety. Estimated LOS: 5-7 days 2. Reviewed admission labs: CMP-CO2 20 glucose 100, CBC hemoglobin hematocrit and platelets are within normal limits, acetaminophen, salicylates and ethylalcohol-nontoxic, viral tests are negative, HIV test is nonreactive, urine analysis positive for ketones 15 and urine pregnancy test is negative and urine tox screen positive for tetrahydrocannabinol and EKG 12-lead-sinus bradycardia secondary to clonidine overdose.  No new labs today 3. Patient will participate in group, milieu, and family therapy. Psychotherapy: Social and 04/13/2020, anti-bullying, learning based strategies, cognitive behavioral, and family object relations individuation separation intervention psychotherapies can be considered.  4. Depression:  Slowly improving: Wellbutrin XL 150  mg daily for  depression.  5. DMDD: Slowly improving: Continue Trileptal 300 mg BID starting from 04/10/2020. 6. Anxiety/insomnia:Improving; continue Vistaril 50 mg qhs prn -helpful and slept well last night 7. Will continue to monitor patient's mood and behavior. 8. Social Work will schedule a Family meeting to obtain collateral information and discuss discharge and follow up plan.  9. Discharge concerns will also be addressed: Safety, stabilization, and access to medication. 10. Expected date of discharge 04/13/2020  06/13/2020, MD 04/11/2020, 9:49 AM

## 2020-04-11 NOTE — BHH Counselor (Signed)
Pt discussed during progression meeting. The treatment team agreed that pt can discharge on 04/13/20 instead of 04/14/20. Writer will call mother to inform her of change in discharge date and schedule updated time.   Nayomi Tabron S. Makinzy Cleere, LCSWA, MSW [facility]: Child and Adolescent  [phone]

## 2020-04-11 NOTE — Progress Notes (Addendum)
ADOLESCENT GRIEF GROUP NOTE:  Spiritual care group on loss and grief facilitated by Chaplain Burnis Kingfisher, MDiv, BCC  Group goal: Support / education around grief.  Identifying grief patterns, feelings / responses to grief, identifying behaviors that may emerge from grief responses, identifying when one may call on an ally or coping skill.  Group Description:  Following introductions and group rules, group opened with psycho-social ed. Group members engaged in facilitated dialog around topic of loss, with particular support around experiences of loss in their lives. Group Identified types of loss (relationships / self / things) and identified patterns, circumstances, and changes that precipitate losses. Reflected on thoughts / feelings around loss, normalized grief responses, and recognized variety in grief experience.   Group engaged in visual explorer activity, identifying elements of grief journey as well as needs / ways of caring for themselves.  Group reflected on Worden's tasks of grief.  Group facilitation drew on brief cognitive behavioral, narrative, and Adlerian modalities   Patient progress: Tamara Guzman - introduced self as Tamara Guzman.  Present throughout group  Actively engaged in group discussion.  Initially was concerned about group around grief, but engaged with other group members in speaking about creating safe space and became increasingly open to support and engagement.  Spoke of the death of her best friend in November 11, 2019.  Stated she does not typically speak about this outside of this context - normalized with other group members her process of "bottling up" and protecting self, as she feels responses can be invalidating of her feelings.  She also states she keeps things inside to care for the mother of her friend, who "Is carrying her own weight"   Tamara Guzman spoke of her grieving process, engaged in eulogizing friend, connecting to values and ways she is  honoring her friend.  She spoke  about awareness of what he would want for her and how she is attempting to live that now.  She normalized with group that grief feelings are not linear, and she feels both sadness, anger, regret, guilt, as well as hopefulness.   Tamara Guzman spoke of a recent time where grief had come up and this surprised her.  She spoke with group about planning around when grief feelings emerge.    Described feeling "relief" in having space to speak about her feelings around the death of this close friend.

## 2020-04-12 MED ORDER — HYDROXYZINE HCL 50 MG PO TABS: 50.0000 mg | ORAL_TABLET | Freq: Every day | ORAL | 0 refills | Status: DC

## 2020-04-12 MED ORDER — BUPROPION HCL ER (XL) 150 MG PO TB24: 150.0000 mg | ORAL_TABLET | Freq: Every day | ORAL | 0 refills | Status: DC

## 2020-04-12 MED ORDER — OXCARBAZEPINE 300 MG PO TABS: 300.0000 mg | ORAL_TABLET | Freq: Two times a day (BID) | ORAL | 0 refills | Status: DC

## 2020-04-12 NOTE — BHH Group Notes (Signed)
LCSW Group Therapy Note  04/12/2020   1:15p  Type of Therapy and Topic:  Group Therapy: Getting to Know Your Anger  Participation Level:  Active   Description of Group:   In this group, patients learned how to recognize the physical, cognitive, emotional, and behavioral responses they have to anger-provoking situations.  They identified a recent time they became angry and how they reacted.  They analyzed how the situation could have been changed to reduce anger or make the situation more peaceful.  The group discussed factors of situations that they are not able to change and what they do not have control over.  Patients will identify an instance in which they felt in control of their emotions or at ease, identifying their thoughts and feelings and how may these thoughts and feeling aid in reducing or managing anger in the future.  Focus was placed on how helpful it is to recognize the underlying emotions to our anger, because working on those can lead to a more permanent solution as well as our ability to focus on the important rather than the urgent.  Therapeutic Goals: 1. Patients will remember their last incident of anger and how they felt emotionally and physically, what their thoughts were at the time, and how they behaved. 2. Patients will identify things that could have been changed about the situation to reduce anger. 3. Patients will identify things they could not change or control. 4. Patients will explore possible new behaviors to use in future anger situations. 5. Patients will learn that anger itself is normal and cannot be eliminated, and that healthier reactions can assist with resolving conflict rather than worsening situations.  Summary of Patient Progress:  The patient actively engaged in introductory check-in, sharing of feeling "Happy". Pt shared that her most recent time of anger was when at her friends and her friend asked to get her something to eat but did not order items  from the dollar menu. Pt identified she could have told her in a nice way how she felt. Pt identified factors that she could not control, specifically her friends behavior, as these behaviors were past. Pt identified an instance in which she did have control of her emotions being when she did not feel the situation was worth arguing about. Pt further identified alternate peaceful situations and acknowledged her thoughts of how she would keep the current feeling of peace in future instances. Pt acknowledged anger can not be eliminated and proved receptive to alternate group members input and CSW feedback.  Therapeutic Modalities:   Cognitive Behavioral Therapy    Leisa Lenz, LCSWA 04/12/2020   3:13PM

## 2020-04-12 NOTE — BHH Suicide Risk Assessment (Signed)
[facility] Discharge Suicide Risk Assessment   Principal Problem: MDD (major depressive disorder), severe (HCC) Discharge Diagnoses: Principal Problem:   MDD (major depressive disorder), severe (HCC) Active Problems:   Post traumatic stress disorder (PTSD)   Drug overdose, multiple drugs, intentional self-harm, initial encounter (HCC)   Total Time spent with patient: 15 minutes  Musculoskeletal: Strength & Muscle Tone: within normal limits Gait & Station: normal Patient leans: N/A  Psychiatric Specialty Exam: Review of Systems  Blood pressure 122/74, pulse (!) 106, temperature 98.3 F (36.8 C), resp. rate 14, height 5\' 1"  (1.549 m), weight 105.5 kg, SpO2 100 %.Body mass index is 43.95 kg/m.  General Appearance: Fairly Groomed  ::  Good  Speech:  Clear and Coherent, normal rate  Volume:  Normal  Mood:  Euthymic  Affect:  Full Range  Thought Process:  Goal Directed, Intact, Linear and Logical  Orientation:  Full (Time, Place, and Person)  Thought Content:  Denies any A/VH, no delusions elicited, no preoccupations or ruminations  Suicidal Thoughts:  No  Homicidal Thoughts:  No  Memory:  good  Judgement:  Fair  Insight:  Present  Psychomotor Activity:  Normal  Concentration:  Fair  Recall:  Good  Fund of Knowledge:Fair  Language: Good  Akathisia:  No  Handed:  Right  AIMS (if indicated):     Assets:  Communication Skills Desire for Improvement Financial Resources/Insurance Housing Physical Health Resilience Social Support Vocational/Educational  ADL's:  Intact  Cognition: WNL    ADL's:  Intact   Mental Status Per Nursing Assessment::   On Admission:  Suicidal ideation indicated by patient, Self-harm behaviors, Intention to act on suicide plan  Demographic Factors:  Adolescent or young adult  Loss Factors: NA  Historical Factors: Impulsivity  Risk Reduction Factors:   Sense of responsibility to family, Religious beliefs about death, Living with  another person, especially a relative, Positive social support, Positive therapeutic relationship and Positive coping skills or problem solving skills  Continued Clinical Symptoms:  Severe Anxiety and/or Agitation Bipolar Disorder:   Mixed State More than one psychiatric diagnosis Unstable or Poor Therapeutic Relationship Previous Psychiatric Diagnoses and Treatments  Cognitive Features That Contribute To Risk:  Polarized thinking    Suicide Risk:  Minimal: No identifiable suicidal ideation.  Patients presenting with no risk factors but with morbid ruminations; may be classified as minimal risk based on the severity of the depressive symptoms  Follow-up Information    [facility]. Go on 04/15/2020.   Why: Therapy appointment with 06/15/2020 at [facility] information: Address: [address] [place], [place] P: [address] Fax:  [phone]       Care, [phone] [facility]. Go on 04/17/2020.   Specialty: Family Medicine Why: You are scheduled for an appointment on 04/17/20 at 12:00 pm for medication management.  This appointment will be held in person. Contact information: [address] [place] [phone]           Plan Of Care/Follow-up recommendations:  Activity:  As tolerated Diet:  Regular  [phone], MD 04/13/2020, 10:35 AM

## 2020-04-12 NOTE — Progress Notes (Signed)
   04/12/20 2053  Psych Admission Type (Psych Patients Only)  Admission Status Involuntary  Psychosocial Assessment  Patient Complaints None  Eye Contact Brief  Facial Expression Animated  Affect Silly;Labile  Speech Logical/coherent;Loud  Interaction Attention-seeking (Hyperverbal)  Motor Activity Fidgety  Appearance/Hygiene Unremarkable  Behavior Characteristics Cooperative  Mood Pleasant  Thought Process  Coherency WDL  Content WDL  Delusions None reported or observed  Perception WDL  Hallucination None reported or observed  Judgment Poor  Confusion None  Danger to Self  Current suicidal ideation? Denies  Danger to Others  Danger to Others None reported or observed

## 2020-04-12 NOTE — Progress Notes (Signed)
[facility] MD Progress Note  04/12/2020 11:42 AM Tamara Guzman  MRN:  [id number]  Subjective:  "I am excited and ready to go home but I could not promise to my family that I will not harm myself if becomes emotional"  In brief this is a 19 years old female with bipolar disorder, ADHD, anxiety but noncompliant with medication since 19 years old. She was admitted to the hospital due to suicidal attempt, took Cipro, Latuda and clonidine required medical stabilization in [facility].  On evaluation the patient reported: Patient appeared increased psychomotor activity, hyperverbal, excited and at the same time cannot promise her family members regarding her safety while talking with her family yesterday.  Patient stated by participating in group therapeutic activities yesterday she learned that she do not need to permanent solution for a temporary problem.  Patient stated she means she do not need to kill herself because of being frustrated or sadness which is a temporary problem.  Patient stated is not worth of my life, my life is beautiful I have not seen you when I have part of the world.  Patient reported goal is finding coping skills to control her emotional problems and preparing for discharge for tomorrow.  Patient talked to her grandmother and mother about coping skills she has been learning during this hospitalization and told them she is ready to come home.  Patient stated when she becomes upset and angry she dropped out of the nursing school and then now she regrets about it.  Patient stated she tried to be have a better day today.  When patient was challenged about not able to promise her family members and emotional dysregulation patient misunderstood and started using words like manipulation, rude and defiant and disrespectful.  Patient started worrying about not able to be discharged as scheduled.  Patient become loud, emotional and tearful when to the room and staff RN went to her room and talk to  her and provided support. Staff RN reported patient has been fine and able to understand the situation.  Patient current medications are oxcarbazepine 300 mg 2 times daily for mood stabilization and Wellbutrin XL 150 mg daily for depression and Vistaril 50 mg at bedtime for insomnia.  Tolerating above medications and contracting for safety while being in the hospital.   Principal Problem: MDD (major depressive disorder), severe (HCC) Diagnosis: Principal Problem:   MDD (major depressive disorder), severe (HCC) Active Problems:   Post traumatic stress disorder (PTSD)   Drug overdose, multiple drugs, intentional self-harm, initial encounter (HCC)  Total Time spent with patient: 20 minutes  Past Psychiatric History: Bipolar depression, anxiety and ADHD.  Mom was re-obtained custody in August 2020.  Patient had multiple out off placements including foster homes, group homes and recently was at [facility] x 2 weeks.  She had multiple psychiatric hospitalization including admission to the [facility] in [place] prior to discharged to [facility] while custody given to her mother.    Past medications: Latuda 40 mg, 60mg , Clonidine 0.1 mg Qhs, vitamine D.  Patient stopped all her medications when she become 19 years old and reportedly quit nursing school.  Patient discontinued Seroquel as it is making her drowsy and not able to participate in school work.  .   Her current outpatient provider: [facility] - started a month ago for medication and no therapist. She was [facility] from August to January 2021.  Past Medical History:  Past Medical History:  Diagnosis Date  . ADHD   . Anxiety   . Bipolar 1 disorder (HCC)   . Depression   . Obesity    History reviewed. No pertinent surgical history. Family History: History reviewed. No pertinent family history. Family Psychiatric  History: Mother has anxiety and bipolar disorder. She has no current medication.   Social History:  Social History   Substance and Sexual Activity  Alcohol Use Not Currently     Social History   Substance and Sexual Activity  Drug Use Yes  . Types: Marijuana    Social History   Socioeconomic History  . Marital status: Single    Spouse name: Not on file  . Number of children: Not on file  . Years of education: Not on file  . Highest education level: Not on file  Occupational History  . Not on file  Tobacco Use  . Smoking status: Never Smoker  . Smokeless tobacco: Never Used  Substance and Sexual Activity  . Alcohol use: Not Currently  . Drug use: Yes    Types: Marijuana  . Sexual activity: Yes    Birth control/protection: None  Other Topics Concern  . Not on file  Social History Narrative  . Not on file   Social Determinants of Health   Financial Resource Strain:   . Difficulty of Paying Living Expenses:   Food Insecurity:   . Worried About Programme researcher, broadcasting/film/video in the Last Year:   . Barista in the Last Year:   Transportation Needs:   . Freight forwarder (Medical):   Marland Kitchen Lack of Transportation (Non-Medical):   Physical Activity:   . Days of Exercise per Week:   . Minutes of Exercise per Session:   Stress:   . Feeling of Stress :   Social Connections:   . Frequency of Communication with Friends and Family:   . Frequency of Social Gatherings with Friends and Family:   . Attends Religious Services:   . Active Member of Clubs or Organizations:   . Attends Banker Meetings:   Marland Kitchen Marital Status:    Additional Social History:      Sleep: Good  Appetite:  Good  Current Medications: Current Facility-Administered Medications  Medication Dose Route Frequency Provider Last Rate Last Admin  . buPROPion (WELLBUTRIN XL) 24 hr tablet 150 mg  150 mg Oral Daily Leata Mouse, MD   150 mg at 04/12/20 0805  . hydrOXYzine (ATARAX/VISTARIL) tablet 50 mg  50 mg Oral QHS Leata Mouse, MD   50 mg at 04/11/20  2034  . Oxcarbazepine (TRILEPTAL) tablet 300 mg  300 mg Oral BID Leata Mouse, MD   300 mg at 04/12/20 0805    Lab Results:  No results found for this or any previous visit (from the past 48 hour(s)).  Blood Alcohol level:  Lab Results  Component Value Date   ETH <10 04/05/2020   ETH <10 05/03/2018    Metabolic Disorder Labs: Lab Results  Component Value Date   HGBA1C 5.7 (H) 04/05/2020   MPG 116.89 04/05/2020   MPG 108.28 07/09/2018   Lab Results  Component Value Date   PROLACTIN 1.5 (L) 07/09/2018   Lab Results  Component Value Date   CHOL 161 07/09/2018   TRIG 60 07/09/2018   HDL 52 07/09/2018   CHOLHDL 3.1 07/09/2018   VLDL 12 07/09/2018   LDLCALC 97 07/09/2018    Physical Findings: AIMS: Facial and Oral Movements Muscles of Facial Expression: None,  normal Lips and Perioral Area: None, normal Jaw: None, normal Tongue: None, normal,Extremity Movements Upper (arms, wrists, hands, fingers): None, normal Lower (legs, knees, ankles, toes): None, normal, Trunk Movements Neck, shoulders, hips: None, normal, Overall Severity Severity of abnormal movements (highest score from questions above): None, normal Incapacitation due to abnormal movements: None, normal Patient's awareness of abnormal movements (rate only patient's report): No Awareness, Dental Status Current problems with teeth and/or dentures?: No Does patient usually wear dentures?: No  CIWA:  CIWA-Ar Total: 0 COWS:  COWS Total Score: 1  Musculoskeletal: Strength & Muscle Tone: within normal limits Gait & Station: normal Patient leans: N/A  Psychiatric Specialty Exam: Physical Exam  Review of Systems  Blood pressure 112/66, pulse 86, temperature 98.3 F (36.8 C), resp. rate 14, height 5\' 1"  (1.549 m), weight 105.5 kg, SpO2 100 %.Body mass index is 43.95 kg/m.  General Appearance: Casual  Eye Contact:  Good  Speech:  Clear and Coherent, loquacious  Volume:  Normal  Mood:  Anxious and  Depressed- improving  Affect:  Constricted and Depressed-dysphoric and defiant  Thought Process:  Coherent, Goal Directed and Descriptions of Associations: Intact  Orientation:  Full (Time, Place, and Person)  Thought Content:  Logical  Suicidal Thoughts:  No-denied  Homicidal Thoughts:  No  Memory:  Immediate;   Fair Recent;   Fair Remote;   Fair  Judgement:  Intact  Insight:  Fair  Psychomotor Activity:  Normal  Concentration:  Concentration: Fair and Attention Span: Fair  Recall:  Good  Fund of Knowledge:  Good  Language:  Good  Akathisia:  Negative  Handed:  Right  AIMS (if indicated):     Assets:  Communication Skills Desire for Improvement Financial Resources/Insurance Housing Leisure Time [place] Talents/Skills Transportation Vocational/Educational  ADL's:  Intact  Cognition:  WNL  Sleep:        Treatment Plan Summary: Reviewed current treatment plan on 04/12/2020 Patient becomes emotional, dysphoric, defiant and disrespectful when challenged about not able to provide promised to keep herself safe to her family members.  Patient contract for safety with the staff RN and while being in hospital.  Patient regrets about her mood swings, noncompliant with medication, dropping of the nursing school.  Daily contact with patient to assess and evaluate symptoms and progress in treatment and Medication management 1. Will maintain Q 15 minutes observation for safety. Estimated LOS: 5-7 days 2. Reviewed admission labs: CMP-CO2 20 glucose 100, CBC hemoglobin hematocrit and platelets are within normal limits, acetaminophen, salicylates and ethylalcohol-nontoxic, viral tests are negative, HIV test is nonreactive, urine analysis positive for ketones 15 and urine pregnancy test is negative and urine tox screen positive for tetrahydrocannabinol and EKG 12-lead-sinus bradycardia secondary to clonidine overdose.  No new labs today 3. Patient will  participate in group, milieu, and family therapy. Psychotherapy: Social and Airline pilot, anti-bullying, learning based strategies, cognitive behavioral, and family object relations individuation separation intervention psychotherapies can be considered.  4. Depression:  Improving: Wellbutrin XL 150  mg daily for depression.  5. DMDD: Improving: Continue Trileptal 300 mg BID starting from 04/10/2020. 6. Anxiety/insomnia: Vistaril 50 mg qhs prn -helpful and slept well  7. Will continue to monitor patient's mood and behavior. 8. Social Work will schedule a Family meeting to obtain collateral information and discuss discharge and follow up plan.  9. Discharge concerns will also be addressed: Safety, stabilization, and access to medication. 10. Expected date of discharge 04/13/2020  Ambrose Finland, MD 04/12/2020,  11:42 AM

## 2020-04-12 NOTE — Discharge Summary (Signed)
Physician Discharge Summary Note  Patient:  Tamara Guzman is an 19 y.o., female MRN:  [id number] DOB:  06-16-2001 Patient phone:  [phone] (home)  Patient address:   [address] [place] [address],  Total Time spent with patient: 30 minutes  Date of Admission:  04/07/2020 Date of Discharge: 04/13/2020  Reason for Admission:  Thomasine Klutts [facility] a 19 y.o.female, senior at [employer] lives with her mother and sometimes her stepdad.  Patient has a 2 grownup siblings from her mother's side and she has good communication with them but they live on their own.  Patient [facility] behavioral [facility] from [facility] where she was stabilized after intentional overdose of multiple medications including Cipro, Latuda, clonidine and unknown medications due to increased stress from poor academic grades and worried about not graduating from school etc.  Reportedly she stopped taking medications when she becomes an adult by age of 42.  Principal Problem: MDD (major depressive disorder), severe ([place]) Discharge Diagnoses: Principal Problem:   MDD (major depressive disorder), severe ([place]) Active Problems:   Post traumatic stress disorder (PTSD)   Drug overdose, multiple drugs, intentional self-harm, initial encounter [facility])   Past Psychiatric History: Bipolar depression, ADHD, anxiety and been out-of-home under DSS custody for several years.  Patient was placed in foster homes, group homes multiple acute inpatient psychiatric hospitalizations reportedly in several hospitals including [facility] in [place] and old [facility].  Patient supposed to be taking her medication from [facility] before she stopped taking medication.  Past Medical History:  Past Medical History:  Diagnosis Date  . ADHD   . Anxiety   . Bipolar 1 disorder ([place])   . Depression   . Obesity    History reviewed. No pertinent  surgical history. Family History: History reviewed. No pertinent family history. Family Psychiatric  History: Patient mother has bipolar disorder anxiety and currently not taking any medication Social History:  Social History   Substance and Sexual Activity  Alcohol Use Not Currently     Social History   Substance and Sexual Activity  Drug Use Yes  . Types: Marijuana    Social History   Socioeconomic History  . Marital status: Single    Spouse name: Not on file  . Number of children: Not on file  . Years of education: Not on file  . Highest education level: Not on file  Occupational History  . Not on file  Tobacco Use  . Smoking status: Never Smoker  . Smokeless tobacco: Never Used  Substance and Sexual Activity  . Alcohol use: Not Currently  . Drug use: Yes    Types: Marijuana  . Sexual activity: Yes    Birth control/protection: None  Other Topics Concern  . Not on file  Social History Narrative  . Not on file   Social Determinants of Health   Financial Resource Strain:   . Difficulty of Paying Living Expenses:   Food Insecurity:   . Worried About Charity fundraiser in the Last Year:   . Arboriculturist in the Last Year:   Transportation Needs:   . Film/video editor (Medical):   Marland Kitchen Lack of Transportation (Non-Medical):   Physical Activity:   . Days of Exercise per Week:   . Minutes of Exercise per Session:   Stress:   . Feeling of Stress :   Social Connections:   . Frequency of Communication with Friends  and Family:   . Frequency of Social Gatherings with Friends and Family:   . Attends Religious Services:   . Active Member of Clubs or Organizations:   . Attends Archivist Meetings:   Marland Kitchen Marital Status:     Hospital Course:   1. Patient was admitted to the Child and adolescent  unit of [facility] under the service of Dr. Louretta Shorten. Safety:  Placed in Q15 minutes observation for safety. During the course of this  hospitalization patient did not required any change on her observation and no PRN or time out was required.  No major behavioral problems reported during the hospitalization.  2. Routine labs reviewed: CMP-CO2 20 glucose 100, CBC hemoglobin hematocrit and platelets are within normal limits, acetaminophen, salicylates and ethylalcohol-nontoxic, viral tests are negative, HIV test is nonreactive, urine analysis positive for ketones 15 and urine pregnancy test is negative and urine tox screen positive for tetrahydrocannabinol and EKG 12-lead-sinus bradycardia secondary to clonidine overdose.  3. An individualized treatment plan according to the patient's age, level of functioning, diagnostic considerations and acute behavior was initiated.  4. Preadmission medications, according to the guardian, consisted of patient is noncompliant with the outpatient medications since November 16, 2019 due to being 19 years old 5. During this hospitalization she participated in all forms of therapy including  group, milieu, and family therapy.  Patient met with her psychiatrist on a daily basis and received full nursing service.  6. Due to long standing mood/behavioral symptoms the patient was started in Wellbutrin XL 150 mg for depression, Trileptal 150 mg 2 times daily which was titrated to 300 mg twice daily for mood stabilization and hydroxyzine 25 mg daily at bedtime which was increased to 50 mg for better control of insomnia.  Patient tolerated the above medication without adverse effects and positively responded.  Patient participated in milieu therapy and group therapeutic activities and family has been supportive for her care during this hospitalization.  Patient has no safety concerns and contract for safety throughout this hospitalization at the time of discharge.  Patient will be discharged to parents care and also provide appropriate referral to the outpatient medication management and counseling services.    Permission was granted from the guardian.  There  were no major adverse effects from the medication.  7.  Patient was able to verbalize reasons for her living and appears to have a positive outlook toward her future.  A safety plan was discussed with her and her guardian. She was provided with national suicide Hotline phone # 1-800-273-TALK as well as [facility]  number. 8. General Medical Problems: Patient medically stable  and baseline physical exam within normal limits with no abnormal findings.Follow up with general medical care 9. The patient appeared to benefit from the structure and consistency of the inpatient setting, continue current medication regimen and integrated therapies. During the hospitalization patient gradually improved as evidenced by: Denied suicidal ideation, homicidal ideation, psychosis, depressive symptoms subsided.   She displayed an overall improvement in mood, behavior and affect. She was more cooperative and responded positively to redirections and limits set by the staff. The patient was able to verbalize age appropriate coping methods for use at home and school. 10. At discharge conference was held during which findings, recommendations, safety plans and aftercare plan were discussed with the caregivers. Please refer to the therapist note for further information about issues discussed on family session. 11. On discharge patients denied psychotic symptoms, suicidal/homicidal ideation,  intention or plan and there was no evidence of manic or depressive symptoms.  Patient was discharge home on stable condition   Physical Findings: AIMS: Facial and Oral Movements Muscles of Facial Expression: None, normal Lips and Perioral Area: None, normal Jaw: None, normal Tongue: None, normal,Extremity Movements Upper (arms, wrists, hands, fingers): None, normal Lower (legs, knees, ankles, toes): None, normal, Trunk Movements Neck, shoulders, hips: None, normal,  Overall Severity Severity of abnormal movements (highest score from questions above): None, normal Incapacitation due to abnormal movements: None, normal Patient's awareness of abnormal movements (rate only patient's report): No Awareness, Dental Status Current problems with teeth and/or dentures?: No Does patient usually wear dentures?: No  CIWA:  CIWA-Ar Total: 0 COWS:  COWS Total Score: 1   Psychiatric Specialty Exam: See MD discharge SRA Physical Exam  Review of Systems  Blood pressure 122/74, pulse (!) 106, temperature 98.3 F (36.8 C), resp. rate 14, height '5\' 1"'  (1.549 m), weight 105.5 kg, SpO2 100 %.Body mass index is 43.95 kg/m.  Sleep:        Have you used any form of tobacco in the last 30 days? (Cigarettes, Smokeless Tobacco, Cigars, and/or Pipes): Yes  Has this patient used any form of tobacco in the last 30 days? (Cigarettes, Smokeless Tobacco, Cigars, and/or Pipes) Yes, No  Blood Alcohol level:  Lab Results  Component Value Date   ETH <10 04/05/2020   ETH <10 16/09/9603    Metabolic Disorder Labs:  Lab Results  Component Value Date   HGBA1C 5.7 (H) 04/05/2020   MPG 116.89 04/05/2020   MPG 108.28 07/09/2018   Lab Results  Component Value Date   PROLACTIN 1.5 (L) 07/09/2018   Lab Results  Component Value Date   CHOL 161 07/09/2018   TRIG 60 07/09/2018   HDL 52 07/09/2018   CHOLHDL 3.1 07/09/2018   VLDL 12 07/09/2018   LDLCALC 97 07/09/2018    See Psychiatric Specialty Exam and Suicide Risk Assessment completed by Attending Physician prior to discharge.  Discharge destination:  Home  Is patient on multiple antipsychotic therapies at discharge:  No   Has Patient had three or more failed trials of antipsychotic monotherapy by history:  No  Recommended Plan for Multiple Antipsychotic Therapies: NA  Discharge Instructions    Activity as tolerated - No restrictions   Complete by: As directed    Diet general   Complete by: As directed     Discharge instructions   Complete by: As directed    Discharge Recommendations:  The patient is being discharged to her family. Patient is to take her discharge medications as ordered.  See follow up above. We recommend that she participate in individual therapy to target bipolar mood swings, depression, anxiety and suicidal attempt with intentional overdose We recommend that she participate in  family therapy to target the conflict with her family, improving to communication skills and conflict resolution skills. Family is to initiate/implement a contingency based behavioral model to address patient's behavior. We recommend that she get AIMS scale, height, weight, blood pressure, fasting lipid panel, fasting blood sugar in three months from discharge as she is on atypical antipsychotics. Patient will benefit from monitoring of recurrence suicidal ideation since patient is on antidepressant medication. The patient should abstain from all illicit substances and alcohol.  If the patient's symptoms worsen or do not continue to improve or if the patient becomes actively suicidal or homicidal then it is recommended that the patient return to the closest hospital emergency  room or call 911 for further evaluation and treatment.  National Suicide Prevention Lifeline 1800-SUICIDE or 4161951582. Please follow up with your primary medical doctor for all other medical needs.  The patient has been educated on the possible side effects to medications and she/her guardian is to contact a medical professional and inform outpatient provider of any new side effects of medication. She is to take regular diet and activity as tolerated.  Patient would benefit from a daily moderate exercise. Family was educated about removing/locking any firearms, medications or dangerous products from the home.     Allergies as of 04/13/2020      Reactions   Other Hives, Other (See Comments)   PATIENT DEVELOPS HIVES IF EXPOSED TO  ANY TREE NUTS!! Peanut   Chocolate Hives   Cocoa Other (See Comments)   Peanut-containing Drug Products Hives      Medication List    TAKE these medications     Indication  buPROPion 150 MG 24 hr tablet Commonly known as: WELLBUTRIN XL Take 1 tablet (150 mg total) by mouth daily.  Indication: Major Depressive Disorder   hydrOXYzine 50 MG tablet Commonly known as: ATARAX/VISTARIL Take 1 tablet (50 mg total) by mouth at bedtime. What changed:   medication strength  how much to take  when to take this  reasons to take this  Indication: Feeling Anxious, insomnia   Oxcarbazepine 300 MG tablet Commonly known as: TRILEPTAL Take 1 tablet (300 mg total) by mouth 2 (two) times daily.  Indication: Mood swings.      Follow-up Information    [employer] [employer]. Go on 04/15/2020.   Why: Therapy appointment with Peggyann Shoals at [facility] information: Address: [address] [place], [place] [address] P: [phone] Fax:  [phone]       [facility] Go on 04/17/2020.   Specialty: Family Medicine Why: You are scheduled for an appointment on 04/17/20 at 12:00 pm for medication management.  This appointment will be held in person. Contact information: [address] [place] [address] [phone]           Follow-up recommendations:  Activity:  as tolerated Diet:  Regular  Comments:  Follow discharge instructions.  Signed: Ambrose Finland, MD 04/13/2020, 10:35 AM

## 2020-04-12 NOTE — Progress Notes (Signed)
   04/12/20 0800  Psych Admission Type (Psych Patients Only)  Admission Status Involuntary  Psychosocial Assessment  Patient Complaints None  Eye Contact Brief  Facial Expression Animated  Affect Silly;Labile  Speech Logical/coherent;Loud  Interaction Attention-seeking (Hyperverbal)  Motor Activity Fidgety  Appearance/Hygiene Unremarkable  Behavior Characteristics Cooperative  Mood Depressed;Anxious  Thought Process  Coherency WDL  Content WDL  Delusions None reported or observed  Perception WDL  Hallucination None reported or observed  Judgment Poor  Confusion None  Danger to Self  Current suicidal ideation? Denies  Danger to Others  Danger to Others None reported or observed      COVID-19 Daily Checkoff  Have you had a fever (temp > 37.80C/100F)  in the past 24 hours?  No  If you have had runny nose, nasal congestion, sneezing in the past 24 hours, has it worsened? No  COVID-19 EXPOSURE  Have you traveled outside the state in the past 14 days? No  Have you been in contact with someone with a confirmed diagnosis of COVID-19 or PUI in the past 14 days without wearing appropriate PPE? No  Have you been living in the same home as a person with confirmed diagnosis of COVID-19 or a PUI (household contact)? No  Have you been diagnosed with COVID-19? No

## 2020-04-13 NOTE — Progress Notes (Addendum)
  [facility] Adult Case Management Discharge Plan :  Will you be returning to the same living situation after discharge:  Yes,  Returning home with mother, Cherly Beach. At discharge, do you have transportation home?: Yes,  Mother, Cherly Beach will pick pt up at 10am. Do you have the ability to pay for your medications: Yes,  [facility] has [facility].  Release of information consent forms completed/faxed, and turned in to Medical Records by CSW.   Patient to Follow up at: Follow-up Information    [facility]. Go on 04/15/2020.   Why: Therapy appointment with Shelva Majestic at [facility] information: Address: [address] [place], [place] [address] P: [phone] Fax:  [phone]       [facility]. Go on 04/17/2020.   Specialty: Family Medicine Why: You are scheduled for an appointment on 04/17/20 at 12:00 pm for medication management.  This appointment will be held in person. Contact information: [address] [place] [address] [phone]           Next level of care provider has access to [facility] Link:no  Safety Planning and Suicide Prevention discussed: Yes,  SPE completed with pt and parent. Pt will receive pamphlet at discharge.  Have you used any form of tobacco in the last 30 days? (Cigarettes, Smokeless Tobacco, Cigars, and/or Pipes): Yes  Has patient been referred to the Quitline?: N/A patient is not a smoker  Patient has been referred for addiction treatment: N/A  Leisa Lenz, LCSW 04/13/2020, 8:38 AM

## 2020-04-13 NOTE — Progress Notes (Signed)
Patient and guardian educated about follow up care, upcoming appointments reviewed. Patient verbalizes understanding of all follow up appointments. AVS and suicide safety plan reviewed. Patient expresses no concerns or questions at this time. Educated on prescriptions and medication regimen. Patient belongings returned. Patient denies SI, HI, AVH at this time. Educated patient about suicide help resources and hotline, encouraged to call for assistance in the event of a crisis. Patient agrees. Patient is ambulatory and safe at time of discharge. Patient discharged to hospital lobby at this time.  [place] NOVEL CORONAVIRUS (COVID-19) DAILY CHECK-OFF SYMPTOMS - answer yes or no to each - every day NO YES  Have you had a fever in the past 24 hours?  . Fever (Temp > 37.80C / 100F) X   Have you had any of these symptoms in the past 24 hours? . New Cough .  Sore Throat  .  Shortness of Breath .  Difficulty Breathing .  Unexplained Body Aches   X   Have you had any one of these symptoms in the past 24 hours not related to allergies?   . Runny Nose .  Nasal Congestion .  Sneezing   X   If you have had runny nose, nasal congestion, sneezing in the past 24 hours, has it worsened?  X   EXPOSURES - check yes or no X   Have you traveled outside the state in the past 14 days?  X   Have you been in contact with someone with a confirmed diagnosis of COVID-19 or PUI in the past 14 days without wearing appropriate PPE?  X   Have you been living in the same home as a person with confirmed diagnosis of COVID-19 or a PUI (household contact)?    X   Have you been diagnosed with COVID-19?    X              What to do next: Answered NO to all: Answered YES to anything:   Proceed with unit schedule Follow the [facility] Inpatient Flowsheet.    

## 2020-04-29 ENCOUNTER — Ambulatory Visit: Payer: Medicaid Other

## 2020-07-25 ENCOUNTER — Encounter: Payer: Medicaid Other | Admitting: Certified Nurse Midwife

## 2021-02-12 ENCOUNTER — Encounter (HOSPITAL_COMMUNITY): Payer: Self-pay | Admitting: Emergency Medicine

## 2021-02-12 ENCOUNTER — Other Ambulatory Visit: Payer: Self-pay

## 2021-02-12 ENCOUNTER — Ambulatory Visit (HOSPITAL_COMMUNITY)
Admission: EM | Admit: 2021-02-12 | Discharge: 2021-02-12 | Disposition: A | Discharge status: Home / Self Care | Payer: Medicaid Other | Attending: Emergency Medicine | Admitting: Emergency Medicine

## 2021-02-12 DIAGNOSIS — J069 Acute upper respiratory infection, unspecified: Secondary | ICD-10-CM

## 2021-02-12 LAB — POCT RAPID STREP A, ED / UC: Streptococcus, Group A Screen (Direct): NEGATIVE

## 2021-02-12 NOTE — Discharge Instructions (Addendum)
Follow up at the [facility] or [facility] for evaluation of vaginal and urinary symptoms.  Throat culture will result in 2-3 days, you will be called if it shows any growth so that you can be treated   For viruses you can try medications to help you feel comfort Sore throat- salt water gargles, chloraseptic spray, throat lozenges, hot or cold liquids Ear pressure- allergy medicine such as clartin, zyrtec, allegra Congestion- mucinex, sudafed,  Fever- tylenol   Follow up if symptoms are worsening or still present after 1-2 weeks

## 2021-02-12 NOTE — ED Triage Notes (Signed)
States that she has a sore throat that started two weeks ago. Pt states that she tried OTC medication and nothing has helped.

## 2021-02-12 NOTE — ED Provider Notes (Signed)
[place]    CSN: [id number] Arrival date & time: 02/12/21  1746      History   Chief Complaint Chief Complaint  Patient presents with  . Sore Throat    HPI Tamara Guzman is a 20 y.o. female.   Patient presents with dry and painful sore throat starting two weeks ago. Painful to swallow, wakes up at night with throat throbbing. Also attest to pressure in bilateral ears, sneezing, coughing, feeling feverish, adenopathy, nausea, diarrhea, urinary frequency, one episode of incontinence, thick  vaginal discharge and vaginal odor. Recent sick contact. Has attempted otc cold medication with no relief.   Past Medical History:  Diagnosis Date  . ADHD   . Anxiety   . Bipolar 1 disorder ([place])   . Depression   . Obesity     Patient Active Problem List   Diagnosis Date Noted  . Drug overdose, multiple drugs, intentional self-harm, initial encounter ([place]) 04/05/2020  . Hyperglycemia 04/05/2020  . MDD (major depressive disorder), severe ([place]) 07/09/2018  . Post traumatic stress disorder (PTSD) 07/09/2018  . ADHD (attention deficit hyperactivity disorder) 07/09/2018    History reviewed. No pertinent surgical history.  OB History   No obstetric history on file.      Home Medications    Prior to Admission medications   Medication Sig Start Date End Date Taking? Authorizing Provider  buPROPion (WELLBUTRIN XL) 150 MG 24 hr tablet Take 1 tablet (150 mg total) by mouth daily. 04/13/20   Ambrose Finland, MD  hydrOXYzine (ATARAX/VISTARIL) 50 MG tablet Take 1 tablet (50 mg total) by mouth at bedtime. 04/12/20   Ambrose Finland, MD  Oxcarbazepine (TRILEPTAL) 300 MG tablet Take 1 tablet (300 mg total) by mouth 2 (two) times daily. 04/12/20   Ambrose Finland, MD  ARIPiprazole (ABILIFY) 20 MG tablet Take 1 tablet (20 mg total) by mouth at bedtime. 07/13/18 03/31/20  Ambrose Finland, MD  cloNIDine HCl (KAPVAY) 0.1 MG TB12 ER tablet Take 2  tablets (0.2 mg total) by mouth at bedtime. 07/13/18 03/31/20  Ambrose Finland, MD  dicyclomine (BENTYL) 20 MG tablet Take 20 mg by mouth 4 (four) times daily. 04/26/18 03/31/20  [provider]  FLUoxetine (PROZAC) 20 MG capsule Take 1 capsule (20 mg total) by mouth every morning. 07/13/18 03/31/20  Ambrose Finland, MD  lansoprazole (PREVACID) 15 MG capsule Take 1 capsule (15 mg total) by mouth daily. 10/06/19 03/31/20  Louanne Skye, MD  ranitidine (ZANTAC) 150 MG capsule Take 150 mg by mouth 2 (two) times daily. 04/14/18 03/31/20  [provider]    Family History History reviewed. No pertinent family history.  Social History Social History   Tobacco Use  . Smoking status: Never Smoker  . Smokeless tobacco: Never Used  Vaping Use  . Vaping Use: Never used  Substance Use Topics  . Alcohol use: Not Currently  . Drug use: Yes    Types: Marijuana     Allergies   Other, Chocolate, Cocoa, and Peanut-containing drug products   Review of Systems Review of Systems  Constitutional: Positive for fever. Negative for activity change, appetite change, chills, diaphoresis, fatigue and unexpected weight change.  HENT: Positive for congestion, ear pain, postnasal drip, rhinorrhea, sneezing and sore throat. Negative for dental problem, drooling, ear discharge, facial swelling, hearing loss, mouth sores, nosebleeds, sinus pressure, sinus pain, tinnitus, trouble swallowing and voice change.   Eyes: Negative.   Respiratory: Negative.   Cardiovascular: Negative.   Gastrointestinal: Positive for diarrhea, nausea and vomiting.  Negative for abdominal distention, abdominal pain, anal bleeding, blood in stool, constipation and rectal pain.  Endocrine: Negative.   Genitourinary: Positive for decreased urine volume, frequency and vaginal discharge. Negative for difficulty urinating, dyspareunia, dysuria, enuresis, flank pain, genital sores, hematuria, menstrual problem, pelvic  pain, urgency, vaginal bleeding and vaginal pain.  Musculoskeletal: Negative.   Skin: Negative.   Neurological: Negative.   Psychiatric/Behavioral: Negative.      Physical Exam Triage Vital Signs ED Triage Vitals  Enc Vitals Group     BP 02/12/21 1802 113/62     Pulse Rate 02/12/21 1802 94     Resp --      Temp 02/12/21 1802 98.2 F (36.8 C)     Temp Source 02/12/21 1802 Oral     SpO2 02/12/21 1802 97 %     Weight --      Height --      Head Circumference --      Peak Flow --      Pain Score 02/12/21 1801 8     Pain Loc --      Pain Edu? --      Excl. in [place]? --    No data found.  Updated Vital Signs BP 113/62 (BP Location: Right Arm)   Pulse 94   Temp 98.2 F (36.8 C) (Oral)   LMP 01/26/2021   SpO2 97%   Visual Acuity Right Eye Distance:   Left Eye Distance:   Bilateral Distance:    Right Eye Near:   Left Eye Near:    Bilateral Near:     Physical Exam Constitutional:      Appearance: She is well-developed and normal weight.  HENT:     Head: Normocephalic.     Right Ear: No drainage, swelling or tenderness. A middle ear effusion is present. Tympanic membrane is not erythematous.     Left Ear: No drainage, swelling or tenderness. A middle ear effusion is present. Tympanic membrane is not erythematous.     Nose: Congestion and rhinorrhea present.     Mouth/Throat:     Mouth: Mucous membranes are moist.     Pharynx: Posterior oropharyngeal erythema present. No pharyngeal swelling or uvula swelling.     Tonsils: Tonsillar exudate present. No tonsillar abscesses. 1+ on the right. 0 on the left.  Eyes:     Conjunctiva/sclera: Conjunctivae normal.     Pupils: Pupils are equal, round, and reactive to light.  Cardiovascular:     Rate and Rhythm: Normal rate and regular rhythm.     Heart sounds: Normal heart sounds.  Pulmonary:     Effort: Pulmonary effort is normal.     Breath sounds: Normal breath sounds.  Abdominal:     General: Bowel sounds are normal.      Palpations: Abdomen is soft.  Musculoskeletal:        General: Normal range of motion.     Cervical back: Normal range of motion.  Lymphadenopathy:     Cervical: Cervical adenopathy present.  Skin:    General: Skin is warm and dry.  Neurological:     General: No focal deficit present.     Mental Status: She is alert and oriented to person, place, and time.  Psychiatric:        Mood and Affect: Mood normal.        Behavior: Behavior normal.      UC Treatments / Results  Labs (all labs ordered are listed, but only abnormal results are displayed)  Labs Reviewed  POCT RAPID STREP A, ED / UC    EKG   Radiology No results found.  Procedures Procedures (including critical care time)  Medications Ordered in UC Medications - No data to display  Initial Impression / Assessment and Plan / UC Course  I have reviewed the triage vital signs and the nursing notes.  Pertinent labs & imaging results that were available during my care of the patient were reviewed by me and considered in my medical decision making (see chart for details).  Viral URI  Patient having a myriad of symptoms which would requiring several diagnostic test to determine cause of symptoms. Only wanting to be tested for strep today. Discussed typical symptomology of strep and how it would not other symptoms mentioned such as urinary frequency. Verbalized understanding  1. Wants to use otc medications, given multiple treatment options in writing for options for symptom management  2. Rapid strep- negative, sent for culture- pending,will treat as needed  3. COVID test- declined 4. Urinalysis- declined, given resources to be evaluated, discussed importance of screening for infection  5. Aptima swab for sti screening- declined, given resources to be evaluated, discussed importance of screening for infections  6. Discussed follow up precautions, verbalized understanding   Final Clinical Impressions(s) / UC  Diagnoses   Final diagnoses:  None   Discharge Instructions   None    ED Prescriptions    None     PDMP not reviewed this encounter.   Hans Eden, NP 02/12/21 1942

## 2021-02-15 LAB — CULTURE, GROUP A STREP (THRC)

## 2021-04-23 IMAGING — CR DG FOOT COMPLETE 3+V*L*
3 series · 3 of 3 positions shown · non-contrast
Comparison: None.

CLINICAL DATA: 18-year-old female with possible foreign body at the
foot

EXAM:
LEFT FOOT - COMPLETE 3+ VIEW

[foot ap]
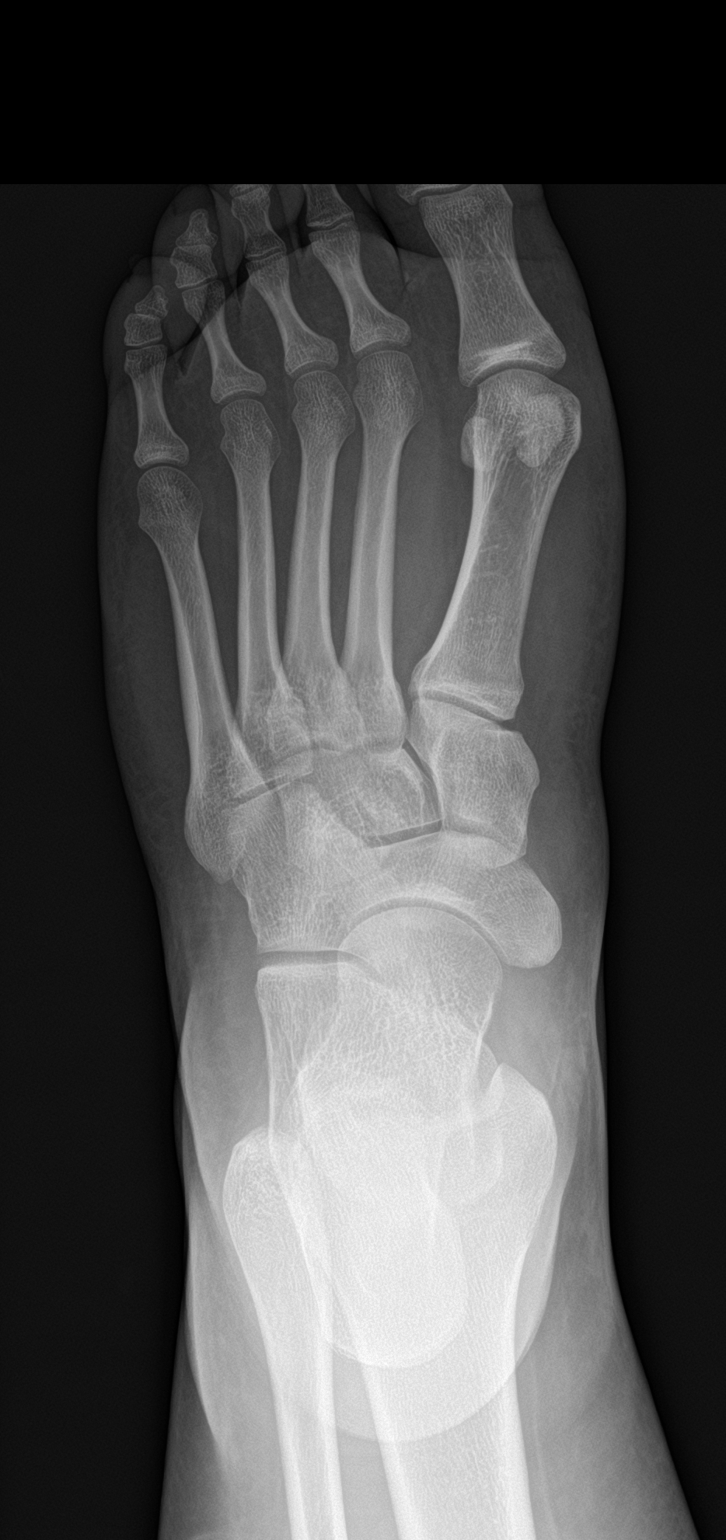

[foot obl]
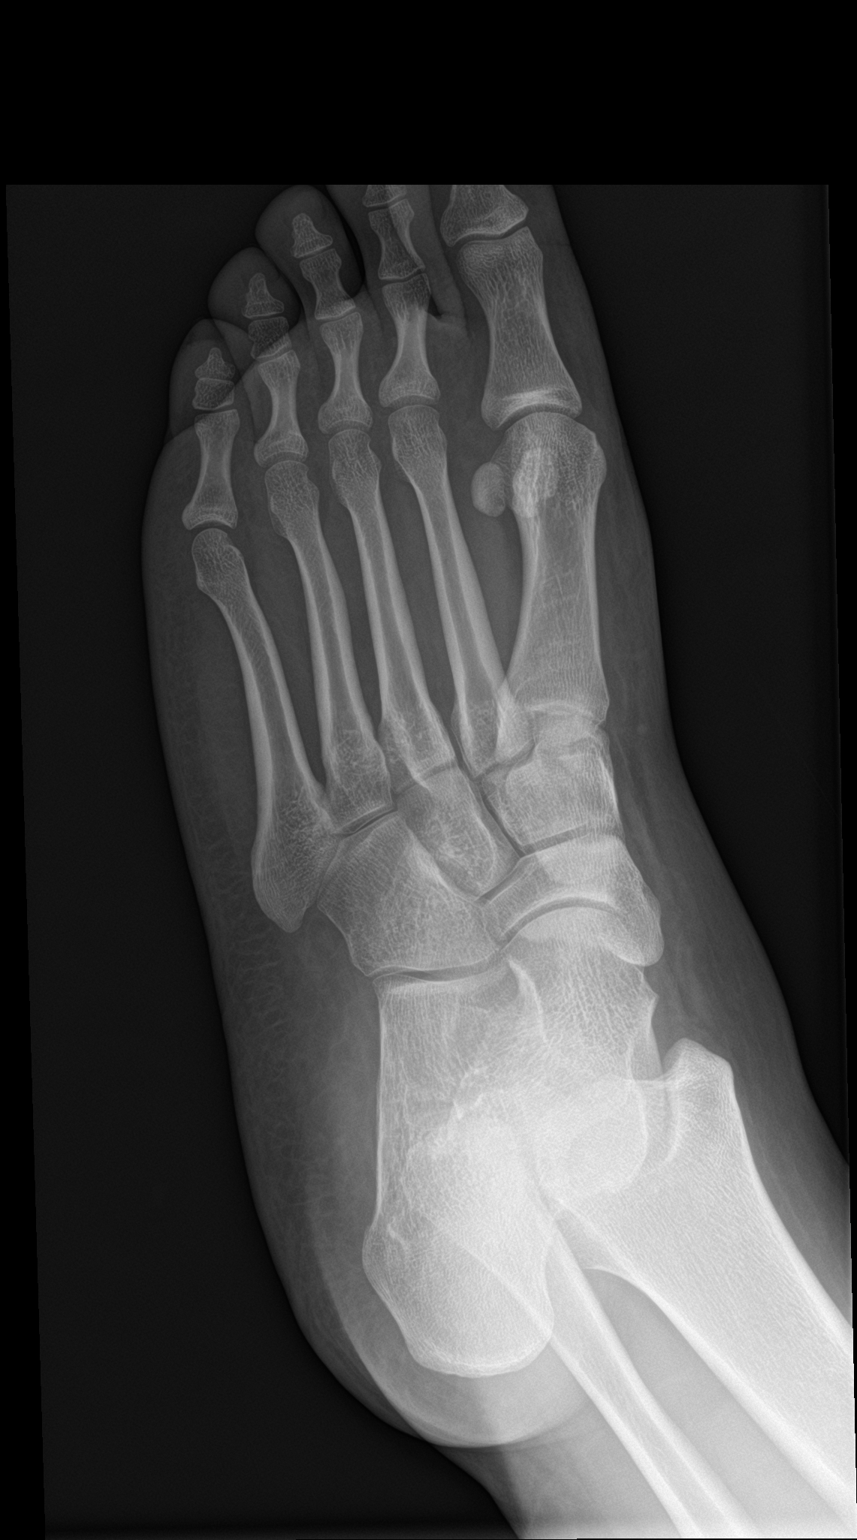

[foot lat]
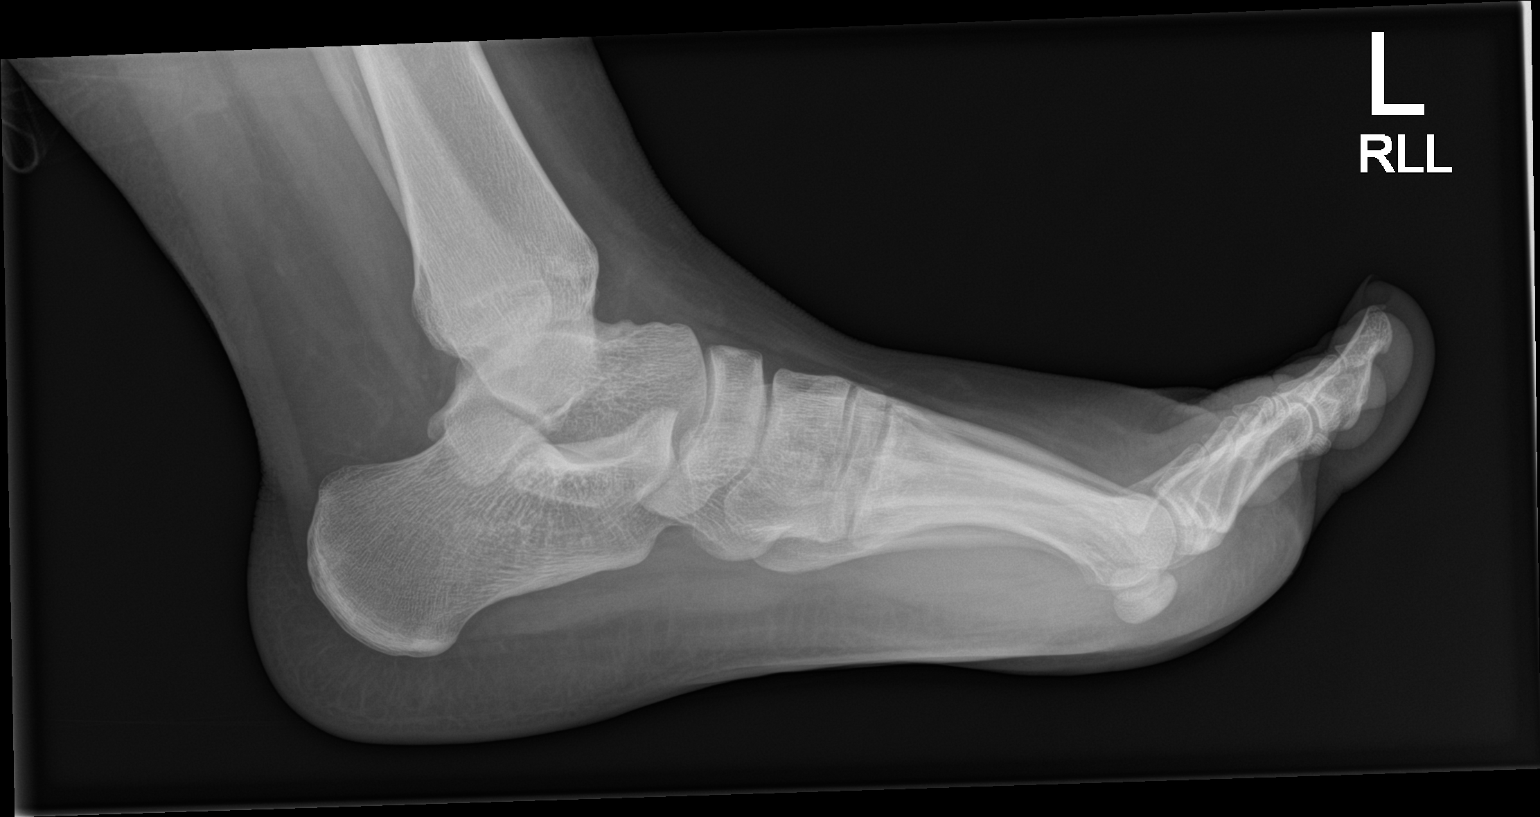

[3 of 3 positions shown; findings below may reference images not displayed]

FINDINGS: No acute displaced fracture. No focal soft tissue swelling. No
degenerative changes. No subluxation/dislocation. No subcutaneous
gas. No radiopaque foreign body.
IMPRESSION: Negative for acute bony abnormality.

Negative for radiopaque foreign body.

## 2021-11-20 ENCOUNTER — Encounter (HOSPITAL_COMMUNITY): Payer: Self-pay | Admitting: Emergency Medicine

## 2021-11-20 ENCOUNTER — Ambulatory Visit (HOSPITAL_COMMUNITY)
Admission: EM | Admit: 2021-11-20 | Discharge: 2021-11-20 | Disposition: A | Discharge status: Home / Self Care | Payer: Medicaid Other | Attending: Emergency Medicine | Admitting: Emergency Medicine

## 2021-11-20 DIAGNOSIS — N898 Other specified noninflammatory disorders of vagina: Secondary | ICD-10-CM | POA: Diagnosis not present

## 2021-11-20 LAB — POCT URINALYSIS DIPSTICK, ED / UC
Bilirubin Urine: NEGATIVE
Glucose, UA: NEGATIVE mg/dL
Hgb urine dipstick: NEGATIVE
Ketones, ur: NEGATIVE mg/dL
Nitrite: NEGATIVE
Protein, ur: NEGATIVE mg/dL
Specific Gravity, Urine: 1.02 (ref 1.005–1.030)
Urobilinogen, UA: 0.2 mg/dL (ref 0.0–1.0)
pH: 7 (ref 5.0–8.0)

## 2021-11-20 LAB — POC URINE PREG, ED: Preg Test, Ur: NEGATIVE

## 2021-11-20 LAB — HIV ANTIBODY (ROUTINE TESTING W REFLEX): HIV Screen 4th Generation wRfx: NONREACTIVE

## 2021-11-20 NOTE — ED Triage Notes (Signed)
Pt reports that she had dysuria and bad odor with urination for a month. Reports has nexplanon and hasnt had a cycle in a couple months but then will spot. Pt also reports some abd cramping.

## 2021-11-20 NOTE — ED Provider Notes (Signed)
[facility]    CSN: [id number] Arrival date & time: 11/20/21  1020      History   Chief Complaint Chief Complaint  Patient presents with   Dysuria    HPI Tamara Guzman is a 20 y.o. female.  Patient reports 1 month of vaginal discharge with odor.  Does not have dysuria though she initially reported dysuria.  Vaginal discharge is associated with intermittent abdominal cramping.  Denies abdominal cramping at this time.  Patient reports she has had unprotected sex.   Dysuria Associated symptoms: vaginal discharge   Associated symptoms: no fever, no nausea and no vomiting    Past Medical History:  Diagnosis Date   ADHD    Anxiety    Bipolar 1 disorder ([facility])    Depression    Obesity     Patient Active Problem List   Diagnosis Date Noted   Drug overdose, multiple drugs, intentional self-harm, initial encounter ([facility]) 04/05/2020   Hyperglycemia 04/05/2020   MDD (major depressive disorder), severe ([facility]) 07/09/2018   Post traumatic stress disorder (PTSD) 07/09/2018   ADHD (attention deficit hyperactivity disorder) 07/09/2018    History reviewed. No pertinent surgical history.  OB History   No obstetric history on file.      Home Medications    Prior to Admission medications   Medication Sig Start Date End Date Taking? Authorizing Provider  buPROPion (WELLBUTRIN XL) 150 MG 24 hr tablet Take 1 tablet (150 mg total) by mouth daily. 04/13/20   Leata Mouse, MD  hydrOXYzine (ATARAX/VISTARIL) 50 MG tablet Take 1 tablet (50 mg total) by mouth at bedtime. 04/12/20   Leata Mouse, MD  Oxcarbazepine (TRILEPTAL) 300 MG tablet Take 1 tablet (300 mg total) by mouth 2 (two) times daily. 04/12/20   Leata Mouse, MD  ARIPiprazole (ABILIFY) 20 MG tablet Take 1 tablet (20 mg total) by mouth at bedtime. 07/13/18 03/31/20  Leata Mouse, MD  cloNIDine HCl (KAPVAY) 0.1 MG TB12 ER tablet Take 2 tablets (0.2 mg total) by mouth at bedtime.  07/13/18 03/31/20  Leata Mouse, MD  dicyclomine (BENTYL) 20 MG tablet Take 20 mg by mouth 4 (four) times daily. 04/26/18 03/31/20  [provider]  FLUoxetine (PROZAC) 20 MG capsule Take 1 capsule (20 mg total) by mouth every morning. 07/13/18 03/31/20  Leata Mouse, MD  lansoprazole (PREVACID) 15 MG capsule Take 1 capsule (15 mg total) by mouth daily. 10/06/19 03/31/20  Niel Hummer, MD  ranitidine (ZANTAC) 150 MG capsule Take 150 mg by mouth 2 (two) times daily. 04/14/18 03/31/20  [provider]    Family History No family history on file.  Social History Social History   Tobacco Use   Smoking status: Never   Smokeless tobacco: Never  Vaping Use   Vaping Use: Never used  Substance Use Topics   Alcohol use: Not Currently   Drug use: Yes    Types: Marijuana     Allergies   Other, Chocolate, Cocoa, and Peanut-containing drug products   Review of Systems Review of Systems  Constitutional:  Negative for chills and fever.  Gastrointestinal:  Negative for nausea and vomiting.  Genitourinary:  Positive for dysuria and vaginal discharge. Negative for hematuria.    Physical Exam Triage Vital Signs ED Triage Vitals  Enc Vitals Group     BP 11/20/21 1216 (!) 90/58     Pulse Rate 11/20/21 1216 66     Resp 11/20/21 1216 17     Temp 11/20/21 1216 98.6 F (37 C)  Temp Source 11/20/21 1216 Oral     SpO2 11/20/21 1216 96 %     Weight --      Height --      Head Circumference --      Peak Flow --      Pain Score 11/20/21 1215 5     Pain Loc --      Pain Edu? --      Excl. in [place]? --    No data found.  Updated Vital Signs BP (!) 90/58 (BP Location: Left Arm)   Pulse 66   Temp 98.6 F (37 C) (Oral)   Resp 17   SpO2 96%   Visual Acuity Right Eye Distance:   Left Eye Distance:   Bilateral Distance:    Right Eye Near:   Left Eye Near:    Bilateral Near:     Physical Exam Constitutional:      General: She is not in acute  distress.    Appearance: Normal appearance. She is not ill-appearing.  Pulmonary:     Effort: Pulmonary effort is normal.  Neurological:     Mental Status: She is alert and oriented to person, place, and time.     Gait: Gait normal.     UC Treatments / Results  Labs (all labs ordered are listed, but only abnormal results are displayed) Labs Reviewed  POCT URINALYSIS DIPSTICK, ED / UC - Abnormal; Notable for the following components:      Result Value   Leukocytes,Ua TRACE (*)    All other components within normal limits  HIV ANTIBODY (ROUTINE TESTING W REFLEX)  RPR  POC URINE PREG, ED  CERVICOVAGINAL ANCILLARY ONLY    EKG   Radiology No results found.  Procedures Procedures (including critical care time)  Medications Ordered in UC Medications - No data to display  Initial Impression / Assessment and Plan / UC Course  I have reviewed the triage vital signs and the nursing notes.  Pertinent labs & imaging results that were available during my care of the patient were reviewed by me and considered in my medical decision making (see chart for details).    GC, chlamydia, BV, yeast, trichomonas, HIV, RPR results pending.  Final Clinical Impressions(s) / UC Diagnoses   Final diagnoses:  Vaginal discharge     Discharge Instructions      You will get a call if tests are positive, you will not get a call if tests are negative but you can check results in MyChart if you have a MyChart account.       ED Prescriptions   None    PDMP not reviewed this encounter.   Carvel Getting, NP 11/20/21 1247

## 2021-11-20 NOTE — Discharge Instructions (Signed)
You will get a call if tests are positive, you will not get a call if tests are negative but you can check results in MyChart if you have a MyChart account.

## 2021-11-21 LAB — RPR: RPR Ser Ql: NONREACTIVE

## 2021-11-23 LAB — CERVICOVAGINAL ANCILLARY ONLY
Bacterial Vaginitis (gardnerella): POSITIVE — AB
Candida Glabrata: NEGATIVE
Candida Vaginitis: POSITIVE — AB
Chlamydia: NEGATIVE
Comment: NEGATIVE
Comment: NEGATIVE
Comment: NEGATIVE
Comment: NEGATIVE
Comment: NEGATIVE
Comment: NORMAL
Neisseria Gonorrhea: NEGATIVE
Trichomonas: POSITIVE — AB

## 2021-11-24 ENCOUNTER — Telehealth (HOSPITAL_COMMUNITY): Payer: Self-pay | Admitting: Emergency Medicine

## 2021-11-24 MED ORDER — FLUCONAZOLE 150 MG PO TABS: 150.0000 mg | ORAL_TABLET | Freq: Once | ORAL | 0 refills | Status: AC

## 2021-11-24 MED ORDER — METRONIDAZOLE 500 MG PO TABS: 500.0000 mg | ORAL_TABLET | Freq: Two times a day (BID) | ORAL | 0 refills | Status: DC

## 2022-02-02 ENCOUNTER — Emergency Department (HOSPITAL_COMMUNITY)
Admission: EM | Admit: 2022-02-02 | Discharge: 2022-02-02 | Disposition: A | Discharge status: Home / Self Care | Payer: Medicaid Other | Attending: Emergency Medicine | Admitting: Emergency Medicine

## 2022-02-02 ENCOUNTER — Encounter (HOSPITAL_COMMUNITY): Payer: Self-pay

## 2022-02-02 ENCOUNTER — Other Ambulatory Visit: Payer: Self-pay

## 2022-02-02 DIAGNOSIS — R000 Tachycardia, unspecified: Secondary | ICD-10-CM | POA: Insufficient documentation

## 2022-02-02 DIAGNOSIS — Z5321 Procedure and treatment not carried out due to patient leaving prior to being seen by health care provider: Secondary | ICD-10-CM | POA: Insufficient documentation

## 2022-02-02 DIAGNOSIS — R1032 Left lower quadrant pain: Secondary | ICD-10-CM | POA: Diagnosis not present

## 2022-02-02 DIAGNOSIS — N939 Abnormal uterine and vaginal bleeding, unspecified: Secondary | ICD-10-CM | POA: Diagnosis not present

## 2022-02-02 LAB — CBC WITH DIFFERENTIAL/PLATELET
Abs Immature Granulocytes: 0.02 10*3/uL (ref 0.00–0.07)
Basophils Absolute: 0 10*3/uL (ref 0.0–0.1)
Basophils Relative: 1 %
Eosinophils Absolute: 0.1 10*3/uL (ref 0.0–0.5)
Eosinophils Relative: 2 %
HCT: 39.7 % (ref 36.0–46.0)
Hemoglobin: 13.1 g/dL (ref 12.0–15.0)
Immature Granulocytes: 0 %
Lymphocytes Relative: 42 %
Lymphs Abs: 2.6 10*3/uL (ref 0.7–4.0)
MCH: 27.3 pg (ref 26.0–34.0)
MCHC: 33 g/dL (ref 30.0–36.0)
MCV: 82.7 fL (ref 80.0–100.0)
Monocytes Absolute: 0.6 10*3/uL (ref 0.1–1.0)
Monocytes Relative: 9 %
Neutro Abs: 2.8 10*3/uL (ref 1.7–7.7)
Neutrophils Relative %: 46 %
Platelets: 260 10*3/uL (ref 150–400)
RBC: 4.8 MIL/uL (ref 3.87–5.11)
RDW: 15 % (ref 11.5–15.5)
WBC: 6.1 10*3/uL (ref 4.0–10.5)
nRBC: 0 % (ref 0.0–0.2)

## 2022-02-02 LAB — COMPREHENSIVE METABOLIC PANEL
ALT: 14 U/L (ref 0–44)
AST: 29 U/L (ref 15–41)
Albumin: 3.2 g/dL — ABNORMAL LOW (ref 3.5–5.0)
Alkaline Phosphatase: 46 U/L (ref 38–126)
Anion gap: 8 (ref 5–15)
BUN: 12 mg/dL (ref 6–20)
CO2: 23 mmol/L (ref 22–32)
Calcium: 8.3 mg/dL — ABNORMAL LOW (ref 8.9–10.3)
Chloride: 104 mmol/L (ref 98–111)
Creatinine, Ser: 0.98 mg/dL (ref 0.44–1.00)
GFR, Estimated: >60 mL/min (ref >60)
Glucose, Bld: 107 mg/dL — ABNORMAL HIGH (ref 70–99)
Potassium: 3.6 mmol/L (ref 3.5–5.1)
Sodium: 135 mmol/L (ref 135–145)
Total Bilirubin: 0.8 mg/dL (ref 0.3–1.2)
Total Protein: 5.4 g/dL — ABNORMAL LOW (ref 6.5–8.1)

## 2022-02-02 LAB — I-STAT BETA HCG BLOOD, ED (MC, WL, AP ONLY): I-stat hCG, quantitative: <5 m[IU]/mL (ref <5)

## 2022-02-02 NOTE — ED Triage Notes (Signed)
Reports LLQ pelvic pain beginning yesterday associated with passing 1 large blood clot. 2 negative pregnancy tests. LMP: 01/14/22

## 2022-02-02 NOTE — ED Provider Triage Note (Signed)
Emergency Medicine Provider Triage Evaluation Note  Tamara Guzman , a 21 y.o. female  was evaluated in triage.  Pt complains of vaginal bleeding and left lower quadrant abdominal pain.  Patient states that she was in her normal state of health yesterday she went to urinate and had a blood clot passed out of her vagina.  She states it was extremely large.  She has taken 2 pregnancy tests in the last 24 hours both of which have been negative.  She began having progressively worsening left lower quadrant abdominal pain since that time.  She came in for further evaluation.  She is also noted fast heart rate.  LMP February 2  Review of Systems  Positive: Vaginal bleeding, abdominal pain Negative: Fever or chills  Physical Exam  BP 113/61 (BP Location: Right Arm)    Pulse 80    Temp 98 F (36.7 C)    Resp 17    SpO2 100%  Gen:   Awake, no distress   Resp:  Normal effort  MSK:   Moves extremities without difficulty  Other:  No abdominal distention  Medical Decision Making  Medically screening exam initiated at 1:04 AM.  Appropriate orders placed.  Tamara Guzman was informed that the remainder of the evaluation will be completed by another provider, this initial triage assessment does not replace that evaluation, and the importance of remaining in the ED until their evaluation is complete.  Patient evaluated, work-up initiated, appropriate for waiting.   Tamara Captain, PA-C 02/02/22 0106

## 2022-02-02 NOTE — ED Notes (Signed)
Pt states she has to leave

## 2022-04-06 ENCOUNTER — Ambulatory Visit (HOSPITAL_COMMUNITY): Payer: Medicaid Other

## 2022-04-08 ENCOUNTER — Encounter (HOSPITAL_COMMUNITY): Payer: Self-pay

## 2022-04-08 ENCOUNTER — Ambulatory Visit (HOSPITAL_COMMUNITY)
Admission: EM | Admit: 2022-04-08 | Discharge: 2022-04-08 | Disposition: A | Discharge status: Home / Self Care | Payer: Medicaid Other | Attending: Emergency Medicine | Admitting: Emergency Medicine

## 2022-04-08 DIAGNOSIS — N3000 Acute cystitis without hematuria: Secondary | ICD-10-CM | POA: Diagnosis present

## 2022-04-08 DIAGNOSIS — B3731 Acute candidiasis of vulva and vagina: Secondary | ICD-10-CM | POA: Diagnosis present

## 2022-04-08 DIAGNOSIS — N760 Acute vaginitis: Secondary | ICD-10-CM | POA: Diagnosis present

## 2022-04-08 DIAGNOSIS — A5901 Trichomonal vulvovaginitis: Secondary | ICD-10-CM | POA: Diagnosis present

## 2022-04-08 DIAGNOSIS — B9689 Other specified bacterial agents as the cause of diseases classified elsewhere: Secondary | ICD-10-CM | POA: Insufficient documentation

## 2022-04-08 LAB — POCT URINALYSIS DIPSTICK, ED / UC
Bilirubin Urine: NEGATIVE
Glucose, UA: NEGATIVE mg/dL
Hgb urine dipstick: NEGATIVE
Ketones, ur: NEGATIVE mg/dL
Nitrite: POSITIVE — AB
Protein, ur: NEGATIVE mg/dL
Specific Gravity, Urine: 1.02 (ref 1.005–1.030)
Urobilinogen, UA: 4 mg/dL — ABNORMAL HIGH (ref 0.0–1.0)
pH: 8.5 — ABNORMAL HIGH (ref 5.0–8.0)

## 2022-04-08 MED ORDER — CEPHALEXIN 500 MG PO CAPS: 500.0000 mg | ORAL_CAPSULE | Freq: Two times a day (BID) | ORAL | 0 refills | Status: AC

## 2022-04-08 MED ORDER — METRONIDAZOLE 500 MG PO TABS: 500.0000 mg | ORAL_TABLET | Freq: Two times a day (BID) | ORAL | 0 refills | Status: DC

## 2022-04-08 MED ORDER — FLUCONAZOLE 150 MG PO TABS: 150.0000 mg | ORAL_TABLET | ORAL | 0 refills | Status: AC

## 2022-04-08 NOTE — ED Provider Notes (Signed)
?[facility] ? ? ? ?CSN: [id number] ?Arrival date & time: 04/08/22  1259 ? ? ?  ? ?History   ?Chief Complaint ?Chief Complaint  ?Patient presents with  ? Vaginal Discharge  ? ? ?HPI ?Tamara Guzman is a 21 y.o. female. Pt reports vaginal itching, odor and discharge and also urinary frequency and burning. Pt was seen for same symptoms last December. Review of records in epic show she was positive for trich, yeast, and BV but did not check mychart and get recommended treatments.  ? ? ?Vaginal Discharge ?Associated symptoms: dysuria   ?Associated symptoms: no abdominal pain, no fever, no nausea and no vomiting   ? ?Past Medical History:  ?Diagnosis Date  ? ADHD   ? Anxiety   ? Bipolar 1 disorder ([place])   ? Depression   ? Obesity   ? ? ?Patient Active Problem List  ? Diagnosis Date Noted  ? Drug overdose, multiple drugs, intentional self-harm, initial encounter ([place]) 04/05/2020  ? Hyperglycemia 04/05/2020  ? MDD (major depressive disorder), severe ([place]) 07/09/2018  ? Post traumatic stress disorder (PTSD) 07/09/2018  ? ADHD (attention deficit hyperactivity disorder) 07/09/2018  ? ? ?History reviewed. No pertinent surgical history. ? ?OB History   ?No obstetric history on file. ?  ? ? ? ?Home Medications   ? ?Prior to Admission medications   ?Medication Sig Start Date End Date Taking? Authorizing Provider  ?cephALEXin (KEFLEX) 500 MG capsule Take 1 capsule (500 mg total) by mouth 2 (two) times daily for 7 days. 04/08/22 04/15/22 Yes Carvel Getting, NP  ?fluconazole (DIFLUCAN) 150 MG tablet Take 1 tablet (150 mg total) by mouth once a week for 2 doses. 04/08/22 04/16/22 Yes Carvel Getting, NP  ?buPROPion (WELLBUTRIN XL) 150 MG 24 hr tablet Take 1 tablet (150 mg total) by mouth daily. 04/13/20   Ambrose Finland, MD  ?hydrOXYzine (ATARAX/VISTARIL) 50 MG tablet Take 1 tablet (50 mg total) by mouth at bedtime. 04/12/20   Ambrose Finland, MD  ?metroNIDAZOLE (FLAGYL) 500 MG tablet Take 1 tablet (500 mg  total) by mouth 2 (two) times daily. 04/08/22   Carvel Getting, NP  ?Oxcarbazepine (TRILEPTAL) 300 MG tablet Take 1 tablet (300 mg total) by mouth 2 (two) times daily. 04/12/20   Ambrose Finland, MD  ?ARIPiprazole (ABILIFY) 20 MG tablet Take 1 tablet (20 mg total) by mouth at bedtime. 07/13/18 03/31/20  Ambrose Finland, MD  ?cloNIDine HCl (KAPVAY) 0.1 MG TB12 ER tablet Take 2 tablets (0.2 mg total) by mouth at bedtime. 07/13/18 03/31/20  Ambrose Finland, MD  ?dicyclomine (BENTYL) 20 MG tablet Take 20 mg by mouth 4 (four) times daily. 04/26/18 03/31/20  [provider]  ?FLUoxetine (PROZAC) 20 MG capsule Take 1 capsule (20 mg total) by mouth every morning. 07/13/18 03/31/20  Ambrose Finland, MD  ?lansoprazole (PREVACID) 15 MG capsule Take 1 capsule (15 mg total) by mouth daily. 10/06/19 03/31/20  Louanne Skye, MD  ?ranitidine (ZANTAC) 150 MG capsule Take 150 mg by mouth 2 (two) times daily. 04/14/18 03/31/20  [provider]  ? ? ?Family History ?Family History  ?Problem Relation Age of Onset  ? Hypertension Mother   ? Diabetes Mother   ? Healthy Father   ? ? ?Social History ?Social History  ? ?Tobacco Use  ? Smoking status: Never  ? Smokeless tobacco: Never  ?Vaping Use  ? Vaping Use: Never used  ?Substance Use Topics  ? Alcohol use: Not Currently  ? Drug use: Yes  ?  Types: Marijuana  ? ? ? ?Allergies   ?Other, Chocolate, Cocoa, and Peanut-containing drug products ? ? ?Review of Systems ?Review of Systems  ?Constitutional:  Negative for chills and fever.  ?Gastrointestinal:  Negative for abdominal pain, nausea and vomiting.  ?Genitourinary:  Positive for dysuria, frequency and vaginal discharge. Negative for flank pain, genital sores and hematuria.  ? ? ?Physical Exam ?Triage Vital Signs ?ED Triage Vitals [04/08/22 1357]  ?Enc Vitals Group  ?   BP (!) 100/58  ?   Pulse Rate 80  ?   Resp 18  ?   Temp 98.1 ?F (36.7 ?C)  ?   Temp Source Oral  ?   SpO2 99 %  ?   Weight   ?   Height    ?   Head Circumference   ?   Peak Flow   ?   Pain Score 0  ?   Pain Loc   ?   Pain Edu?   ?   Excl. in [place]?   ? ?No data found. ? ?Updated Vital Signs ?BP (!) 100/58 (BP Location: Left Arm)   Pulse 80   Temp 98.1 ?F (36.7 ?C) (Oral)   Resp 18   SpO2 99%  ? ?Visual Acuity ?Right Eye Distance:   ?Left Eye Distance:   ?Bilateral Distance:   ? ?Right Eye Near:   ?Left Eye Near:    ?Bilateral Near:    ? ?Physical Exam ?Constitutional:   ?   General: She is not in acute distress. ?   Appearance: Normal appearance. She is not ill-appearing.  ?Pulmonary:  ?   Effort: Pulmonary effort is normal.  ?Neurological:  ?   Mental Status: She is alert.  ? ? ? ?UC Treatments / Results  ?Labs ?(all labs ordered are listed, but only abnormal results are displayed) ?Labs Reviewed  ?POCT URINALYSIS DIPSTICK, ED / UC - Abnormal; Notable for the following components:  ?    Result Value  ? pH 8.5 (*)   ? Urobilinogen, UA 4.0 (*)   ? Nitrite POSITIVE (*)   ? Leukocytes,Ua TRACE (*)   ? All other components within normal limits  ?CERVICOVAGINAL ANCILLARY ONLY  ? ? ?EKG ? ? ?Radiology ?No results found. ? ?Procedures ?Procedures (including critical care time) ? ?Medications Ordered in UC ?Medications - No data to display ? ?Initial Impression / Assessment and Plan / UC Course  ?I have reviewed the triage vital signs and the nursing notes. ? ?Pertinent labs & imaging results that were available during my care of the patient were reviewed by me and considered in my medical decision making (see chart for details). ? ?  ?Reviewed how STIs occur. Pt visibly upset about results/trich dx from last December. Pos nitrite on UA. Will treat for yeast, BV, UTI.  ? ?Final Clinical Impressions(s) / UC Diagnoses  ? ?Final diagnoses:  ?Trichomoniasis of vagina  ?Acute cystitis without hematuria  ?Bacterial vaginosis  ?Vaginal yeast infection  ? ? ? ?Discharge Instructions   ? ?  ?Your partner will need to get treated for trichomonas as this is a sexually  transmitted infection. That means you got it from someone and you can give it to others.  The treatment is Metronidazole, which we sent to the pharmacy we had on file for you.  Do not drink alcohol while taking this medicine.  ?  ?Please refrain from sexual activities for 7 days from time of COMPLETING treatment to prevent further spread of the  infection.   ? ? ?ED Prescriptions   ? ? Medication Sig Dispense Auth. Provider  ? metroNIDAZOLE (FLAGYL) 500 MG tablet Take 1 tablet (500 mg total) by mouth 2 (two) times daily. 14 tablet Carvel Getting, NP  ? fluconazole (DIFLUCAN) 150 MG tablet Take 1 tablet (150 mg total) by mouth once a week for 2 doses. 2 tablet Carvel Getting, NP  ? cephALEXin (KEFLEX) 500 MG capsule Take 1 capsule (500 mg total) by mouth 2 (two) times daily for 7 days. 14 capsule Carvel Getting, NP  ? ?  ? ?PDMP not reviewed this encounter. ?  ?Carvel Getting, NP ?04/08/22 1441 ? ?

## 2022-04-08 NOTE — ED Triage Notes (Signed)
3 wk h/o vaginal itching, odor and discharge. No meds taken. Confirms urinary frequency.  ?

## 2022-04-08 NOTE — Discharge Instructions (Addendum)
Your partner will need to get treated for trichomonas as this is a sexually transmitted infection. That means you got it from someone and you can give it to others.  The treatment is Metronidazole, which we sent to the pharmacy we had on file for you.  Do not drink alcohol while taking this medicine.  ?  ?Please refrain from sexual activities for 7 days from time of COMPLETING treatment to prevent further spread of the infection.   ?

## 2022-04-09 LAB — CERVICOVAGINAL ANCILLARY ONLY
Bacterial Vaginitis (gardnerella): POSITIVE — AB
Candida Glabrata: NEGATIVE
Candida Vaginitis: POSITIVE — AB
Chlamydia: NEGATIVE
Comment: NEGATIVE
Comment: NEGATIVE
Comment: NEGATIVE
Comment: NEGATIVE
Comment: NEGATIVE
Comment: NORMAL
Neisseria Gonorrhea: NEGATIVE
Trichomonas: NEGATIVE

## 2022-04-09 NOTE — Progress Notes (Signed)
Attempted to reach patient x 1, no VM available, medications was given on day of visit

## 2022-04-12 NOTE — Progress Notes (Signed)
Pt notified of positive test results and her medications has been sent to the pharmacy on file. All questions answered.

## 2022-04-27 ENCOUNTER — Ambulatory Visit: Payer: Medicaid Other | Admitting: Obstetrics & Gynecology

## 2022-06-27 ENCOUNTER — Ambulatory Visit (HOSPITAL_COMMUNITY)
Admission: AD | Admit: 2022-06-27 | Discharge: 2022-06-27 | Disposition: A | Discharge status: Home / Self Care | Payer: Medicaid Other | Source: Home / Self Care | Attending: Psychiatry | Admitting: Psychiatry

## 2022-06-27 ENCOUNTER — Other Ambulatory Visit (HOSPITAL_COMMUNITY)
Admission: EM | Admit: 2022-06-27 | Discharge: 2022-07-01 | Disposition: A | Discharge status: Home / Self Care | Payer: Medicaid Other | Attending: Psychiatry | Admitting: Psychiatry

## 2022-06-27 DIAGNOSIS — Z20822 Contact with and (suspected) exposure to covid-19: Secondary | ICD-10-CM | POA: Insufficient documentation

## 2022-06-27 DIAGNOSIS — F4310 Post-traumatic stress disorder, unspecified: Secondary | ICD-10-CM | POA: Insufficient documentation

## 2022-06-27 DIAGNOSIS — F314 Bipolar disorder, current episode depressed, severe, without psychotic features: Secondary | ICD-10-CM | POA: Diagnosis not present

## 2022-06-27 DIAGNOSIS — F1910 Other psychoactive substance abuse, uncomplicated: Secondary | ICD-10-CM | POA: Insufficient documentation

## 2022-06-27 DIAGNOSIS — F32A Depression, unspecified: Secondary | ICD-10-CM | POA: Insufficient documentation

## 2022-06-27 DIAGNOSIS — R45851 Suicidal ideations: Secondary | ICD-10-CM | POA: Insufficient documentation

## 2022-06-27 DIAGNOSIS — F1290 Cannabis use, unspecified, uncomplicated: Secondary | ICD-10-CM | POA: Insufficient documentation

## 2022-06-27 DIAGNOSIS — F909 Attention-deficit hyperactivity disorder, unspecified type: Secondary | ICD-10-CM | POA: Insufficient documentation

## 2022-06-27 DIAGNOSIS — Z9151 Personal history of suicidal behavior: Secondary | ICD-10-CM | POA: Insufficient documentation

## 2022-06-27 DIAGNOSIS — D649 Anemia, unspecified: Secondary | ICD-10-CM | POA: Insufficient documentation

## 2022-06-27 DIAGNOSIS — F411 Generalized anxiety disorder: Secondary | ICD-10-CM | POA: Insufficient documentation

## 2022-06-27 DIAGNOSIS — F322 Major depressive disorder, single episode, severe without psychotic features: Secondary | ICD-10-CM | POA: Diagnosis present

## 2022-06-27 LAB — COMPREHENSIVE METABOLIC PANEL
ALT: 10 U/L (ref 0–44)
AST: 16 U/L (ref 15–41)
Albumin: 4 g/dL (ref 3.5–5.0)
Alkaline Phosphatase: 62 U/L (ref 38–126)
Anion gap: 10 (ref 5–15)
BUN: 13 mg/dL (ref 6–20)
CO2: 25 mmol/L (ref 22–32)
Calcium: 8.7 mg/dL — ABNORMAL LOW (ref 8.9–10.3)
Chloride: 102 mmol/L (ref 98–111)
Creatinine, Ser: 0.96 mg/dL (ref 0.44–1.00)
GFR, Estimated: >60 mL/min (ref >60)
Glucose, Bld: 87 mg/dL (ref 70–99)
Potassium: 3.8 mmol/L (ref 3.5–5.1)
Sodium: 137 mmol/L (ref 135–145)
Total Bilirubin: 0.4 mg/dL (ref 0.3–1.2)
Total Protein: 6.4 g/dL — ABNORMAL LOW (ref 6.5–8.1)

## 2022-06-27 LAB — LIPID PANEL
Cholesterol: 142 mg/dL (ref 0–200)
HDL: 50 mg/dL (ref >40)
LDL Cholesterol: 79 mg/dL (ref 0–99)
Total CHOL/HDL Ratio: 2.8 RATIO
Triglycerides: 65 mg/dL (ref <150)
VLDL: 13 mg/dL (ref 0–40)

## 2022-06-27 LAB — HEMOGLOBIN A1C
Hgb A1c MFr Bld: 4.8 % (ref 4.8–5.6)
Mean Plasma Glucose: 91.06 mg/dL

## 2022-06-27 LAB — POCT PREGNANCY, URINE: Preg Test, Ur: NEGATIVE

## 2022-06-27 LAB — POC SARS CORONAVIRUS 2 AG: SARSCOV2ONAVIRUS 2 AG: NEGATIVE

## 2022-06-27 LAB — CBC WITH DIFFERENTIAL/PLATELET
Abs Immature Granulocytes: 0.01 10*3/uL (ref 0.00–0.07)
Basophils Absolute: 0 10*3/uL (ref 0.0–0.1)
Basophils Relative: 1 %
Eosinophils Absolute: 0.1 10*3/uL (ref 0.0–0.5)
Eosinophils Relative: 2 %
HCT: 33.2 % — ABNORMAL LOW (ref 36.0–46.0)
Hemoglobin: 10.9 g/dL — ABNORMAL LOW (ref 12.0–15.0)
Immature Granulocytes: 0 %
Lymphocytes Relative: 47 %
Lymphs Abs: 2.7 10*3/uL (ref 0.7–4.0)
MCH: 26.6 pg (ref 26.0–34.0)
MCHC: 32.8 g/dL (ref 30.0–36.0)
MCV: 81 fL (ref 80.0–100.0)
Monocytes Absolute: 0.6 10*3/uL (ref 0.1–1.0)
Monocytes Relative: 11 %
Neutro Abs: 2.3 10*3/uL (ref 1.7–7.7)
Neutrophils Relative %: 39 %
Platelets: 244 10*3/uL (ref 150–400)
RBC: 4.1 MIL/uL (ref 3.87–5.11)
RDW: 14.4 % (ref 11.5–15.5)
WBC: 5.8 10*3/uL (ref 4.0–10.5)
nRBC: 0 % (ref 0.0–0.2)

## 2022-06-27 LAB — TSH: TSH: 2.326 u[IU]/mL (ref 0.350–4.500)

## 2022-06-27 LAB — SARS CORONAVIRUS 2 BY RT PCR: SARS Coronavirus 2 by RT PCR: NEGATIVE

## 2022-06-27 MED ORDER — TRAZODONE HCL 50 MG PO TABS
50.0000 mg | ORAL_TABLET | Freq: Every evening | ORAL | Status: DC | PRN
  Administered 2022-06-27 – 2022-06-30 (×4): 50 mg via ORAL
  Filled 2022-06-27 (×4): qty 1

## 2022-06-27 MED ORDER — MAGNESIUM HYDROXIDE 400 MG/5ML PO SUSP: 30.0000 mL | Freq: Every day | ORAL | Status: DC | PRN

## 2022-06-27 MED ORDER — ALUM & MAG HYDROXIDE-SIMETH 200-200-20 MG/5ML PO SUSP: 30.0000 mL | ORAL | Status: DC | PRN

## 2022-06-27 MED ORDER — LURASIDONE HCL 40 MG PO TABS
40.0000 mg | ORAL_TABLET | Freq: Once | ORAL | Status: AC
  Administered 2022-06-27: 40 mg via ORAL
  Filled 2022-06-27: qty 1

## 2022-06-27 MED ORDER — LURASIDONE HCL 40 MG PO TABS: 40.0000 mg | ORAL_TABLET | Freq: Every day | ORAL | Status: DC

## 2022-06-27 MED ORDER — LURASIDONE HCL 40 MG PO TABS
40.0000 mg | ORAL_TABLET | Freq: Every day | ORAL | Status: DC
  Administered 2022-06-28: 40 mg via ORAL
  Filled 2022-06-27: qty 1

## 2022-06-27 MED ORDER — HYDROXYZINE HCL 25 MG PO TABS
25.0000 mg | ORAL_TABLET | Freq: Three times a day (TID) | ORAL | Status: DC | PRN
  Administered 2022-06-27: 25 mg via ORAL
  Filled 2022-06-27 (×3): qty 1

## 2022-06-27 MED ORDER — ACETAMINOPHEN 325 MG PO TABS
650.0000 mg | ORAL_TABLET | Freq: Four times a day (QID) | ORAL | Status: DC | PRN
  Administered 2022-06-27: 650 mg via ORAL
  Filled 2022-06-27: qty 2

## 2022-06-27 NOTE — ED Notes (Signed)
Pt is currently using the phone, no distress noted, will continue to monitor patient for safety.

## 2022-06-27 NOTE — ED Notes (Signed)
Pt in room sleeping, no distress noted, will monitor for safety

## 2022-06-27 NOTE — ED Notes (Signed)
Pt admitted to [facility] due to SI with no plan and self-injurious behaviors (scratched self with her nails last week). Pt A&O x4, calm and cooperative. Denies current SI/HI/AVH and is able to verbally contract for safety. Pt tolerated lab work and skin assessment well. Pt ambulated independently to unit. Oriented to unit/staff. Pt declined offer for food/drink at this time. Scrubs provided. No signs of acute distress noted. Will continue to monitor for safety.
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
## 2022-06-27 NOTE — ED Notes (Signed)
Pt asleep in bed. Respirations even and unlabored. Will continue to monitor for safety. ?

## 2022-06-27 NOTE — ED Notes (Signed)
Pt is currently sleeping, no distress noted, environmental check complete, will continue to monitor patient for safety. ? ?
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
## 2022-06-27 NOTE — ED Notes (Signed)
Provider notified of BP and HR.

## 2022-06-27 NOTE — ED Notes (Signed)
PT is in dining room eating dinner, no distress noted, will continue to monitor patient for safety
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
## 2022-06-27 NOTE — ED Notes (Signed)
Pt is currently in the dining room eating lunch.
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
## 2022-06-27 NOTE — ED Notes (Signed)
Pt has a complaint of toothache, pt is asking for orajel, this nurse advised that I would have to get an order and we may not have it on this site, if not it would have to be transported here and not sure how long that would take. This nurse offered the patient acetaminophen which she accepted

## 2022-06-28 ENCOUNTER — Encounter (HOSPITAL_COMMUNITY): Payer: Self-pay

## 2022-06-28 DIAGNOSIS — F411 Generalized anxiety disorder: Secondary | ICD-10-CM | POA: Diagnosis not present

## 2022-06-28 DIAGNOSIS — Z20822 Contact with and (suspected) exposure to covid-19: Secondary | ICD-10-CM | POA: Diagnosis not present

## 2022-06-28 DIAGNOSIS — F314 Bipolar disorder, current episode depressed, severe, without psychotic features: Secondary | ICD-10-CM | POA: Diagnosis not present

## 2022-06-28 DIAGNOSIS — R45851 Suicidal ideations: Secondary | ICD-10-CM | POA: Diagnosis not present

## 2022-06-28 LAB — IRON AND TIBC
Iron: 42 ug/dL (ref 28–170)
Saturation Ratios: 12 % (ref 10.4–31.8)
TIBC: 346 ug/dL (ref 250–450)
UIBC: 304 ug/dL

## 2022-06-28 LAB — FERRITIN: Ferritin: 30 ng/mL (ref 11–307)

## 2022-06-28 MED ORDER — LITHIUM CARBONATE ER 300 MG PO TBCR
300.0000 mg | EXTENDED_RELEASE_TABLET | Freq: Two times a day (BID) | ORAL | Status: DC
  Administered 2022-06-28 – 2022-06-29 (×3): 300 mg via ORAL
  Filled 2022-06-28 (×3): qty 1

## 2022-06-28 NOTE — Progress Notes (Signed)
Patient is awake and alert sitting in day room with peers and watching a movie.  Patient is calm at present and less negative.  Encouraged to make needs known to staff.  Earlier RN educated patient regarding appropriate behavior on unit and it has had a positive effect.  Will monitor and provide safe environment.  Will continue to set limits as appropriate.

## 2022-06-28 NOTE — BH IP Treatment Plan (Addendum)
Interdisciplinary Treatment and Diagnostic Plan Update  06/28/2022 Time of Session: 11:10AM  Tamara Guzman MRN: [id number]  Diagnosis:  Final diagnoses:  Bipolar disorder, current episode depressed, severe, without psychotic features (HCC)     Current Medications:  Current Facility-Administered Medications  Medication Dose Route Frequency Provider Last Rate Last Admin   acetaminophen (TYLENOL) tablet 650 mg  650 mg Oral Q6H PRN Ajibola, Ene A, NP   650 mg at 06/27/22 1845   alum & mag hydroxide-simeth (MAALOX/MYLANTA) 200-200-20 MG/5ML suspension 30 mL  30 mL Oral Q4H PRN Ajibola, Ene A, NP       hydrOXYzine (ATARAX) tablet 25 mg  25 mg Oral TID PRN Ajibola, Ene A, NP   25 mg at 06/27/22 2108   lithium carbonate (LITHOBID) CR tablet 300 mg  300 mg Oral Q12H Park Pope, MD   300 mg at 06/28/22 1431   magnesium hydroxide (MILK OF MAGNESIA) suspension 30 mL  30 mL Oral Daily PRN Ajibola, Ene A, NP       traZODone (DESYREL) tablet 50 mg  50 mg Oral QHS PRN Ajibola, Ene A, NP   50 mg at 06/27/22 2108   Current Outpatient Medications  Medication Sig Dispense Refill   ETONOGESTREL Miguel Barrera 1 each by Subdermal route once. Pt states this was placed in her Right upper arm 37yrs ago and it it time to be removed     buPROPion (WELLBUTRIN XL) 150 MG 24 hr tablet Take 1 tablet (150 mg total) by mouth daily. (Patient not taking: Reported on 06/27/2022) 30 tablet 0   hydrOXYzine (ATARAX/VISTARIL) 50 MG tablet Take 1 tablet (50 mg total) by mouth at bedtime. 30 tablet 0   metroNIDAZOLE (FLAGYL) 500 MG tablet Take 1 tablet (500 mg total) by mouth 2 (two) times daily. (Patient not taking: Reported on 06/27/2022) 14 tablet 0   Oxcarbazepine (TRILEPTAL) 300 MG tablet Take 1 tablet (300 mg total) by mouth 2 (two) times daily. (Patient not taking: Reported on 06/27/2022) 60 tablet 0   PTA Medications: Prior to Admission medications   Medication Sig Start Date End Date Taking? Authorizing Provider  ETONOGESTREL  [place] 1 each by Subdermal route once. Pt states this was placed in her Right upper arm 64yrs ago and it it time to be removed   Yes [provider]  buPROPion (WELLBUTRIN XL) 150 MG 24 hr tablet Take 1 tablet (150 mg total) by mouth daily. Patient not taking: Reported on 06/27/2022 04/13/20   Leata Mouse, MD  hydrOXYzine (ATARAX/VISTARIL) 50 MG tablet Take 1 tablet (50 mg total) by mouth at bedtime. 04/12/20   Leata Mouse, MD  metroNIDAZOLE (FLAGYL) 500 MG tablet Take 1 tablet (500 mg total) by mouth 2 (two) times daily. Patient not taking: Reported on 06/27/2022 04/08/22   Cathlyn Parsons, NP  Oxcarbazepine (TRILEPTAL) 300 MG tablet Take 1 tablet (300 mg total) by mouth 2 (two) times daily. Patient not taking: Reported on 06/27/2022 04/12/20   Leata Mouse, MD  ARIPiprazole (ABILIFY) 20 MG tablet Take 1 tablet (20 mg total) by mouth at bedtime. 07/13/18 03/31/20  Leata Mouse, MD  cloNIDine HCl (KAPVAY) 0.1 MG TB12 ER tablet Take 2 tablets (0.2 mg total) by mouth at bedtime. 07/13/18 03/31/20  Leata Mouse, MD  dicyclomine (BENTYL) 20 MG tablet Take 20 mg by mouth 4 (four) times daily. 04/26/18 03/31/20  [provider]  FLUoxetine (PROZAC) 20 MG capsule Take 1 capsule (20 mg total) by mouth every morning. 07/13/18 03/31/20  Jonnalagadda,  Sharyne Peach, MD  lansoprazole (PREVACID) 15 MG capsule Take 1 capsule (15 mg total) by mouth daily. 10/06/19 03/31/20  Niel Hummer, MD  ranitidine (ZANTAC) 150 MG capsule Take 150 mg by mouth 2 (two) times daily. 04/14/18 03/31/20  [provider]    Patient Stressors: Financial difficulties   Marital or family conflict   Medication change or noncompliance    Patient Strengths: Motivation for treatment/growth   Treatment Modalities: Medication Management, Group therapy, Case management,  1 to 1 session with clinician, Psychoeducation, Recreational therapy.   Physician Treatment Plan for Primary  and Secondary Diagnosis:  Final diagnoses:  Bipolar disorder, current episode depressed, severe, without psychotic features (HCC)   Long Term Goal(s): Improvement in symptoms so as ready for discharge  Short Term Goals: Patient will verbalize feelings in meetings with treatment team members. Patient will attend at least of 50% of the groups daily. Patient will take medications as prescribed daily.  Medication Management: Evaluate patient's response, side effects, and tolerance of medication regimen.  Therapeutic Interventions: 1 to 1 sessions, Unit Group sessions and Medication administration.  Evaluation of Outcomes: Adequate for Discharge  LCSW Treatment Plan for Primary Diagnosis:  Final diagnoses:  Bipolar disorder, current episode depressed, severe, without psychotic features (HCC)    Long Term Goal(s): Safe transition to appropriate next level of care at discharge.  Short Term Goals: Facilitate acceptance of mental health diagnosis and concerns through verbal commitment to aftercare plan and appointments at discharge. and Identify minimum of 2 triggers associated with mental health/substance abuse issues with treatment team members.  Therapeutic Interventions: Assess for all discharge needs, 1 to 1 time with Child psychotherapist, Explore available resources and support systems, Assess for adequacy in community support network, Educate family and significant other(s) on suicide prevention, Complete Psychosocial Assessment, Interpersonal group therapy.  Evaluation of Outcomes: Adequate for Discharge   Progress in Treatment: Attending groups: Yes. Participating in groups: Yes. Taking medication as prescribed: Yes. Toleration medication: Yes. Family/Significant other contact made: No, will contact:  no one Patient understands diagnosis: Yes. Discussing patient identified problems/goals with staff: Yes. Medical problems stabilized or resolved: Yes. Denies suicidal/homicidal ideation:  Yes. Issues/concerns per patient self-inventory: No. Other: None  New problem(s) identified: No, Describe:  none  New Short Term/Long Term Goal(s): Tamara Guzman reports her long term goal is to maintain her symptoms and remain compliant with her medications.   Patient Goals: "I really needed to get back on my meds"  Discharge Plan or Barriers: Tamara Guzman plans to return home with her neighbor. Tamara Guzman will start PHP services on Monday, 07/05/2022.  Reason for Continuation of Hospitalization: Medication stabilization  Estimated Length of Stay: Discharge, Thursday 07/01/22  Last 3 [place] Suicide Severity Risk Score: Flowsheet Row ED from 06/27/2022 in [facility] ED from 04/08/2022 in [facility] ED from 02/02/2022 in [facility] EMERGENCY DEPARTMENT  C-SSRS RISK CATEGORY High Risk No Risk No Risk       Last PHQ 2/9 Scores:    06/27/2022   11:40 AM  Depression screen PHQ 2/9  Decreased Interest 2  Down, Depressed, Hopeless 3  PHQ - 2 Score 5  Altered sleeping 3  Tired, decreased energy 2  Change in appetite 3  Feeling bad or failure about yourself  2  Trouble concentrating 3  Moving slowly or fidgety/restless 0  Suicidal thoughts 3  PHQ-9 Score 21  Difficult doing work/chores Very difficult    Scribe for Treatment Team: Dollie Bressi  Donnella Bi, LCSW 06/28/2022 3:27 PM

## 2022-06-28 NOTE — ED Notes (Signed)
Patient was upset by a negative interaction she had with a peer this morning.  Afterwards she expressed a desire to leave however after speaking to RN, Child psychotherapist and MD  she was reassured and has since changed her mind and will stay for treatment.  Encouraged her to seek out staff if overwhelmed.

## 2022-06-28 NOTE — ED Notes (Signed)
Popsicle given

## 2022-06-28 NOTE — ED Notes (Signed)
Sleeping not sleep disturbance or distress noted respirations are easy.

## 2022-06-28 NOTE — ED Notes (Signed)
Blood specimen collected via L AC x1 stick, tolerated well. Specimens taken to the lab and placed in the red box for pick up from [facility] courier service. Safety maintained while obtaining the specimens.

## 2022-06-28 NOTE — Progress Notes (Signed)
Patient is quietly antagonizing and undermining staff.  She is entitled and negativistic.  Patient has not insight and poor judgement with limited ability to self reflect on her behavior.  Limits set and will monitor.

## 2022-06-28 NOTE — ED Notes (Signed)
Pt is sleeping no signs of distress or sleep disturbance noted respirations are easy.

## 2022-06-28 NOTE — Group Note (Signed)
Group Topic: Wellness  Group Date: 06/28/2022 Start Time: 1350 End Time: 1430 Facilitators: Doyne Keel E  Department: [facility]  Number of Participants: 7  Group Focus: activities of daily living skills, chemical dependency education, chemical dependency issues, coping skills, self-awareness, and self-esteem Treatment Modality:  Behavior Modification Therapy, Individual Therapy, Interpersonal Therapy, and Patient-Centered Therapy Interventions utilized were patient education and support Purpose: enhance coping skills, regain self-worth, and relapse prevention strategies  Name: Tamara Guzman Date of Birth: [birth year]  MR: [id number]    Level of Participation: active Quality of Participation: attentive and cooperative Interactions with others: n/a Mood/Affect: appropriate Triggers (if applicable): n/a Cognition: coherent/clear Progress: Moderate Response: n/a Plan: follow-up needed  Patients Problems:  Patient Active Problem List   Diagnosis Date Noted   Drug overdose, multiple drugs, intentional self-harm, initial encounter (HCC) 04/05/2020   Hyperglycemia 04/05/2020   MDD (major depressive disorder), severe (HCC) 07/09/2018   Post traumatic stress disorder (PTSD) 07/09/2018   ADHD (attention deficit hyperactivity disorder) 07/09/2018
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
## 2022-06-29 DIAGNOSIS — Z20822 Contact with and (suspected) exposure to covid-19: Secondary | ICD-10-CM | POA: Diagnosis not present

## 2022-06-29 DIAGNOSIS — R45851 Suicidal ideations: Secondary | ICD-10-CM | POA: Diagnosis not present

## 2022-06-29 DIAGNOSIS — F314 Bipolar disorder, current episode depressed, severe, without psychotic features: Secondary | ICD-10-CM | POA: Diagnosis not present

## 2022-06-29 DIAGNOSIS — F411 Generalized anxiety disorder: Secondary | ICD-10-CM | POA: Diagnosis not present

## 2022-06-29 MED ORDER — LITHIUM CARBONATE ER 450 MG PO TBCR
450.0000 mg | EXTENDED_RELEASE_TABLET | Freq: Two times a day (BID) | ORAL | Status: DC
  Administered 2022-06-29 – 2022-07-01 (×4): 450 mg via ORAL
  Filled 2022-06-29 (×4): qty 1

## 2022-06-29 NOTE — Progress Notes (Signed)
Patient attended a group focused on depression, anxiety and coping run by RN.  She participated well and demonstrated good insight.

## 2022-06-29 NOTE — ED Notes (Signed)
Patient is calm at present without complaint or distress.  Denies avh shi or plan.  She is selectively social with peers and guarded and avoidant with staff.  She easily gets involved with peer drama and can encourage negative interaction occurring around her.  Patient encouraged to focus on own treatment and avoid becoming overly involved in unit drama.  She denies avh shi or plan.  Will monitor and provide a safe environment.

## 2022-06-29 NOTE — Progress Notes (Signed)
Patient has been more subdued today as several of her peers were discharged this morning.  She has been isolated to her room most of the day except for periods where she has taken meals and/or watched tv.  Patient is without complaint or distress.  Encouraged to make needs known.  Will monitor.
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
## 2022-06-29 NOTE — ED Notes (Signed)
Sleeping well no signs of distress.
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
## 2022-06-29 NOTE — ED Notes (Signed)
Currently sleeping no distress or sleep disturbance is noted skin color appropriate for ethnicity.

## 2022-06-29 NOTE — ED Notes (Signed)
Pt is in the dayroom watching TV and interacting with peers. Respirations are even and unlabored. No acute distress noted. Will continue to monitor for safety.

## 2022-06-29 NOTE — ED Notes (Signed)
Pt is in the bed sleeping. Respirations are even and unlabored. No acute distress noted. Will continue to monitor for safety. 

## 2022-06-30 DIAGNOSIS — F411 Generalized anxiety disorder: Secondary | ICD-10-CM | POA: Diagnosis not present

## 2022-06-30 DIAGNOSIS — R45851 Suicidal ideations: Secondary | ICD-10-CM | POA: Diagnosis not present

## 2022-06-30 DIAGNOSIS — F314 Bipolar disorder, current episode depressed, severe, without psychotic features: Secondary | ICD-10-CM | POA: Diagnosis not present

## 2022-06-30 DIAGNOSIS — Z20822 Contact with and (suspected) exposure to covid-19: Secondary | ICD-10-CM | POA: Diagnosis not present

## 2022-06-30 NOTE — ED Notes (Signed)
Pt is in the bed sleeping. Respirations are even and unlabored. No acute distress noted. Will continue to monitor for safety. 

## 2022-06-30 NOTE — Progress Notes (Signed)
Received Tamara Guzman this AM asleep in her bed, she woke up on her own, received breakfast and made several calls. She endorsed feeling irritated  related to wanting to go home. She denied all other psychiatric symptoms.

## 2022-06-30 NOTE — ED Notes (Signed)
Patient attended group the group theme was Self Actualization achieving one's full potential, self esteem needs, belonging and love needs, safety needs, physiological needs. We discussed food, shelter, sleep, clothing, friendship,family, sense of connection, personal security, resources, respect, self-esteem, feeling of accomplishment, recognition. We talked about choosing a valued life along with a worksheet, being emotionally stable, being independent, appreciated, having self control,being healthy, having deep feelings, being physically safe around family and others, having self acceptance, and loving yourself, before loving someone else. How to avoid being bored, growing as a person having a purpose in life, I gave the patients worksheets to work on, in their free time, they were very much involved in the conversation. Everyone was there except for one person she did want to participate. The meeting lasted an hour. 

## 2022-06-30 NOTE — Progress Notes (Signed)
Tamara Guzman has been OOB in the milieu most of the day with her peers. She attended the safety plan group in the courtyard. She made several calls and read her book.

## 2022-06-30 NOTE — Clinical Social Work Psych Note (Signed)
LCSW Update   Tamara Guzman initially was requesting to discharge home. She complained of the food and not "wanting to be here anymore". Tamara Guzman shared that she continues to plan on returning home wither family friend/neighbor at discharge. Tamara Guzman denied having any SI, HI or AVH at this time.   Dr. Hazle Quant, MD explained to Tamara Guzman that due to her being started on Lithium for medication management, it was recommended for her to remain in the [facility] for one more day so that providers could evaluate her Lithium levels before discharge.   Tamara Guzman expressed understanding and was agreeable to remain in the [facility] until tomorrow morning.   Tamara Guzman denied having any additional questions or concerns at this time.   LCSW will continue to follow until discharge.    Baldo Daub, MSW, LCSW Clinical Child psychotherapist (Facility Based Crisis) [facility]

## 2022-06-30 NOTE — BH Assessment (Deleted)
LCSW Update   Tamara Guzman initially was requesting to discharge home. She complained of the food and not "wanting to be here anymore". Tamara Guzman shared that she continues to plan on returning home wither family friend/neighbor at discharge. Tamara Guzman denied having any SI, HI or AVH at this time.   Dr. Ji, MD explained to Tamara Guzman that due to her being started on Lithium for medication management, it was recommended for her to remain in the [facility] for one more day so that providers could evaluate her Lithium levels before discharge.   Tamara Guzman expressed understanding and was agreeable to remain in the [facility] until tomorrow morning.   Tamara Guzman denied having any additional questions or concerns at this time.   LCSW will continue to follow until discharge.    Daveon Arpino, MSW, LCSW Clinical Social Worker (Facility Based Crisis) [facility]  

## 2022-06-30 NOTE — ED Provider Notes (Signed)
Behavioral Health Progress Note  Date and Time: 06/30/2022 11:49 AM Name: Tamara Guzman MRN:  [id number]  Reason for Admission: Tamara Guzman is a 21 y.o. female with reported history of GAD, bipolar disorder, PTSD, ADHD, cannabis use disorder, previous suicide attempt with ciprofloxicin, latuda, and clonidine who was admitted to the [facility] for worsening depression and SI.  Subjective:   Patient reports mood has been "good".  Patient reports feeling less labile.  Patient very eager to go home.  Denies SI/HI/AVH.  Discussed that we would need to get a lithium level prior to her discharging.  Patient was amenable to staying 1 more day to monitor for side effects and as well as getting a lithium level in the morning.  Denies SI/HI/AVH.  Denies any somatic symptoms since starting lithium.    Still amenable to PHP on discharge which is planned to start early next week.   Attempted to call Ms. Rivers for further collateral, but no one answered phone.  Diagnosis:  Final diagnoses:  Bipolar disorder, current episode depressed, severe, without psychotic features (HCC)    Total Time spent with patient: 45 minutes   Labs  Lab Results:     Latest Ref Rng & Units 06/27/2022    2:20 AM 02/02/2022    1:14 AM 04/06/2020    6:38 AM  CBC  WBC 4.0 - 10.5 K/uL 5.8  6.1  9.0   Hemoglobin 12.0 - 15.0 g/dL 42.7  06.2  37.6   Hematocrit 36.0 - 46.0 % 33.2  39.7  44.9   Platelets 150 - 400 K/uL 244  260  319       Latest Ref Rng & Units 06/27/2022    2:20 AM 02/02/2022    1:14 AM 04/06/2020    6:38 AM  CMP  Glucose 70 - 99 mg/dL 87  283  151   BUN 6 - 20 mg/dL 13  12  9    Creatinine 0.44 - 1.00 mg/dL  7.61  6.07   Sodium 135 - 145 mmol/L 137  135  135   Potassium 3.5 - 5.1 mmol/L 3.8  3.6  3.8   Chloride 98 - 111 mmol/L 102  104  103   CO2 22 - 32 mmol/L 25  23  20    Calcium 8.9 - 10.3 mg/dL 8.7  8.3  9.3   Total Protein 6.5 - 8.1 g/dL 6.4  5.4  7.3   Total Bilirubin 0.3 - 1.2 mg/dL  0.4  0.8  0.8   Alkaline Phos 38 - 126 U/L 62  46  85   AST 15 - 41 U/L 16  29  24    ALT 0 - 44 U/L 10  14  10      Physical Findings   AIMS    Flowsheet Row [facility] Admission (Discharged) from OP [facility]  AIMS Total Score 0 0      AUDIT    Flowsheet Row [facility]  Alcohol Use Disorder Identification Test Final Score (AUDIT) 2      PHQ2-9    Flowsheet Row ED from 06/27/2022 in [facility]  PHQ-2 Total Score 5  PHQ-9 Total Score 21      Flowsheet Row ED from 06/27/2022 in [facility] ED from 04/08/2022 in [facility]  [place] ED from 02/02/2022 in [facility] EM[facility]GENCY DEPARTMENT  C-SSRS RISK CATEGORY High Risk No Risk No Risk        Musculoskeletal  Strength & Muscle Tone: within normal limits Gait & Station: normal Patient leans: N/A  Psychiatric Specialty Exam  Presentation  General Appearance: Appropriate for Environment; Casual   Eye Contact:Good   Speech:Clear and Coherent; Normal Rate   Speech Volume:Normal   Handedness:Right    Mood and Affect  Mood:Anxious   Affect:Appropriate; Congruent    Thought Process  Thought Processes:Coherent; Goal Directed; Linear   Descriptions of Associations:Intact   Orientation:Full (Time, Place and Person)   Thought Content:Logical   Diagnosis of Schizophrenia or Schizoaffective disorder in past: No data recorded    Hallucinations:Hallucinations: None    Ideas of Reference:None   Suicidal Thoughts:Suicidal Thoughts: No    Homicidal Thoughts:Homicidal Thoughts: No     Sensorium  Memory:Immediate Good; Recent Good; Remote  Good   Judgment:Fair   Insight:Fair    Executive Functions  Concentration:Good   Attention Span:Good   Recall:Good   Fund of Knowledge:Good   Language:Good    Psychomotor Activity  Psychomotor Activity:Psychomotor Activity: Normal     Assets  Assets:Communication Skills; Desire for Improvement; Financial Resources/Insurance; Physical Health; Resilience; Social Support    Sleep  Sleep:Sleep: Good     Physical Exam  Physical Exam Vitals and nursing note reviewed.  Constitutional:      General: She is not in acute distress.    Appearance: She is well-developed.  HENT:     Head: Normocephalic and atraumatic.  Eyes:     Conjunctiva/sclera: Conjunctivae normal.  Cardiovascular:     Rate and Rhythm: Normal rate and regular rhythm.     Heart sounds: No murmur heard. Pulmonary:     Effort: Pulmonary effort is normal. No respiratory distress.     Breath sounds: Normal breath sounds.  Abdominal:     Palpations: Abdomen is soft.     Tenderness: There is no abdominal tenderness.  Musculoskeletal:        General: No swelling.     Cervical back: Neck supple.  Skin:    General: Skin is warm and dry.     Capillary Refill: Capillary refill takes less than 2 seconds.  Neurological:     Mental Status: She is alert.  Psychiatric:        Mood and Affect: Mood normal.    Review of Systems  Respiratory:  Negative for shortness of breath.   Cardiovascular:  Negative for chest pain.  Gastrointestinal:  Negative for abdominal pain, constipation, diarrhea, heartburn, nausea and vomiting.  Neurological:  Negative for headaches.   Blood pressure 91/62, pulse 71, temperature 98 F (36.7 C), temperature source Oral, resp. rate 18, SpO2 100 %. There is no height or weight on file to calculate BMI.  ASSESSMENT Tamara Guzman is a 21 y.o. female with reported history of GAD, bipolar disorder, PTSD, ADHD, cannabis use disorder, previous suicide attempt with  ciprofloxicin, latuda, and clonidine who was admitted to the [facility] for worsening depression and SI.  PLAN Bipolar Disorder -Continue lithium 450 mg twice daily and titrate as needed -Lithium trough level 07/01/22  Cannabis Use Disorder -Encourage cessation  Normocytic Anemia Iron and TIBC wnl -Outpatient monitoring  Dispo: PHP referral.  Plan to discharge tomorrow  Park Pope, MD 06/30/2022 11:49 AM

## 2022-06-30 NOTE — ED Notes (Signed)
Pt sleeping@this time. Breathing even and unlabored. Will continue to monitor for safety 

## 2022-06-30 NOTE — ED Notes (Signed)
Pt in dayroom, loud. When approached for questions she is guarded and snappy. Able to redirect. Will continue to monitor for safety

## 2022-06-30 NOTE — ED Notes (Signed)
Pt refused lunch due to not eating spaghetti.  Alternate meal provided.

## 2022-07-01 ENCOUNTER — Other Ambulatory Visit: Payer: Self-pay

## 2022-07-01 DIAGNOSIS — R45851 Suicidal ideations: Secondary | ICD-10-CM | POA: Diagnosis not present

## 2022-07-01 DIAGNOSIS — F411 Generalized anxiety disorder: Secondary | ICD-10-CM | POA: Diagnosis not present

## 2022-07-01 DIAGNOSIS — F314 Bipolar disorder, current episode depressed, severe, without psychotic features: Secondary | ICD-10-CM | POA: Diagnosis not present

## 2022-07-01 DIAGNOSIS — Z20822 Contact with and (suspected) exposure to covid-19: Secondary | ICD-10-CM | POA: Diagnosis not present

## 2022-07-01 LAB — LITHIUM LEVEL: Lithium Lvl: 0.57 mmol/L — ABNORMAL LOW (ref 0.60–1.20)

## 2022-07-01 MED ORDER — TRAZODONE HCL 50 MG PO TABS: 50.0000 mg | ORAL_TABLET | Freq: Every evening | ORAL | 0 refills | Status: DC | PRN

## 2022-07-01 MED ORDER — LITHIUM CARBONATE ER 450 MG PO TBCR: 450.0000 mg | EXTENDED_RELEASE_TABLET | Freq: Two times a day (BID) | ORAL | 0 refills | Status: DC

## 2022-07-01 MED ORDER — HYDROXYZINE HCL 25 MG PO TABS: 25.0000 mg | ORAL_TABLET | Freq: Three times a day (TID) | ORAL | 0 refills | Status: DC | PRN

## 2022-07-01 NOTE — ED Notes (Signed)
Pt sleeping@this time. Breathing even and unlabored. Will continue to monitor for safety 

## 2022-07-01 NOTE — Plan of Care (Addendum)
Patient was discharged prior to receiving AVS, prescriptions, and final assessment. Have attempted to neighbor/grandmother and patient through all current available contact information on Epic that is on file. Will continue to do so until able to provide necessary information to patient including prescriptions.  UPDATE: RN was able to reach mother and mother said she would attempt to reach daughter and have her come back for prescriptions and AVS.  UPDATE2: Was able to get in touch with patient. Patient says she will return to pick up AVS. She reports she is uninterested in taking Lithium any longer as she was getting side effects but did not report them to me including diarrhea, locked legs, and drowsiness. She is refusing the medications offered while she was here. She states she had denied side effects to accelerate the process of leaving. She is amenable to still go to [facility].   Park Pope, MD PGY2 Resident

## 2022-07-01 NOTE — ED Provider Notes (Signed)
FBC/OBS ASAP Discharge Summary  Date and Time: 07/01/2022 1:47 PM  Name: Tamara Guzman [place]  MRN:  [id number]   Discharge Diagnoses:  Final diagnoses:  Bipolar disorder, current episode depressed, severe, without psychotic features [facility])    Subjective: Patient was briefly seen and assessed at bedside.  Patient appeared depressed and in tears.  Patient did not want to speak and asked to be seen later.  Patient was discharged initially without AVS, prescriptions, or information regarding PHP.  Please see plan of care note but briefly patient was able to be reached following unplanned discharge.  Patient reported that she no longer wants to take lithium as she feels she is getting bad side effects including locked legs and diarrhea.  When asked why patient did not report the side effects to me, patient reports that she knew that this would extend her stay and she did not want to stay any longer in the [facility].  Patient was amenable to returning to pick up AVS but was uninterested in continuing any psychotropic medications.  Patient was agreeable to consider psychotropic medications while she was at [facility].  Denies SI/HI/AVH over the phone.  Stay Summary by Problem List Tamara Guzman is a 21 y.o. female with reported history of GAD, bipolar disorder, PTSD, ADHD, cannabis use disorder, previous suicide attempt with ciprofloxicin, latuda, and clonidine who was admitted to the [facility] for worsening depression and SI. Hospital course is detailed below:  Bipolar Disorder Patient was started on lithium given she needed a mood stabilizer and was stating suicidal ideations.  Discussed risks/benefits/side effects of medication prior to initiation of medication patient was understanding and willing to try.  However, patient did experience some side effects and did not report him to provider or nursing as she wanted to leave [facility] and thought that she would not be able to if we had to do further medication titrations.   Lithium Level obtained just prior to discharge was 0.57.  Would recommend further exploration of other psychotropic/mood stabilizers that would be beneficial to patient and minimum side effects while at [facility].  Cannabis Use Disorder Explained with patient that she is at high risk of mood lability and psychosis given her cannabis use.  Encouraged cessation and patient reported she would consider it.  Appears at precontemplative stage at this point.  Normocytic Anemia Presented slightly anemic on CBC with hemoglobin of 10.9.  Ferritin, iron panel showed appropriate iron levels so not IDA.  May have been secondary to patient's menstrual cycle.  PCP Follow-up Recommendations: -CBC to assess if still having anemia   Total Time spent with patient: 45 minutes  Past Psychiatric History: see H&P Past Medical History:  Past Medical History:  Diagnosis Date   ADHD    Anxiety    Bipolar 1 disorder (HCC)    Depression    Obesity    No past surgical history on file. Family History:  Family History  Problem Relation Age of Onset   Hypertension Mother    Diabetes Mother    Healthy Father    Family Psychiatric History: see H&P Social History:  Social History   Substance and Sexual Activity  Alcohol Use Not Currently     Social History   Substance and Sexual Activity  Drug Use Yes   Types: Marijuana    Social History   Socioeconomic History   Marital status: Single    Spouse name: Not on file   Number of children: Not on file   Years of  education: Not on file   Highest education level: Not on file  Occupational History   Not on file  Tobacco Use   Smoking status: Never   Smokeless tobacco: Never  Vaping Use   Vaping Use: Never used  Substance and Sexual Activity   Alcohol use: Not Currently   Drug use: Yes    Types: Marijuana   Sexual activity: Yes    Birth control/protection: None  Other Topics Concern   Not on file  Social History Narrative   Not on file   Social  Determinants of Health   Financial Resource Strain: Not on file  Food Insecurity: Not on file  Transportation Needs: Not on file  Physical Activity: Not on file  Stress: Not on file  Social Connections: Not on file   SDOH:  SDOH Screenings   Alcohol Screen: Low Risk  (04/08/2020)   Alcohol Screen    Last Alcohol Screening Score (AUDIT): 2  Depression (PHQ2-9): Medium Risk (06/27/2022)   Depression (PHQ2-9)    PHQ-2 Score: 21  Financial Resource Strain: Not on file  Food Insecurity: Not on file  Housing: Not on file  Physical Activity: Not on file  Social Connections: Not on file  Stress: Not on file  Tobacco Use: Low Risk  (04/08/2022)   Patient History    Smoking Tobacco Use: Never    Smokeless Tobacco Use: Never    Passive Exposure: Not on file  Transportation Needs: Not on file    Tobacco Cessation:  N/A, patient does not currently use tobacco products  Current Medications:  Current Facility-Administered Medications  Medication Dose Route Frequency Provider Last Rate Last Admin   acetaminophen (TYLENOL) tablet 650 mg  650 mg Oral Q6H PRN Ajibola, Ene A, NP   650 mg at 06/27/22 1845   alum & mag hydroxide-simeth (MAALOX/MYLANTA) 200-200-20 MG/5ML suspension 30 mL  30 mL Oral Q4H PRN Ajibola, Ene A, NP       hydrOXYzine (ATARAX) tablet 25 mg  25 mg Oral TID PRN Ajibola, Ene A, NP   25 mg at 06/27/22 2108   lithium carbonate (ESKALITH) CR tablet 450 mg  450 mg Oral Q12H Park Pope, MD   450 mg at 07/01/22 7824   magnesium hydroxide (MILK OF MAGNESIA) suspension 30 mL  30 mL Oral Daily PRN Ajibola, Ene A, NP       traZODone (DESYREL) tablet 50 mg  50 mg Oral QHS PRN Ajibola, Ene A, NP   50 mg at 06/30/22 2118   Current Outpatient Medications  Medication Sig Dispense Refill   ETONOGESTREL [place] 1 each by Subdermal route once. Pt states this was placed in her Right upper arm 4yrs ago and it it time to be removed      PTA Medications: (Not in a hospital admission)        06/27/2022   11:40 AM  Depression screen PHQ 2/9  Decreased Interest 2  Down, Depressed, Hopeless 3  PHQ - 2 Score 5  Altered sleeping 3  Tired, decreased energy 2  Change in appetite 3  Feeling bad or failure about yourself  2  Trouble concentrating 3  Moving slowly or fidgety/restless 0  Suicidal thoughts 3  PHQ-9 Score 21  Difficult doing work/chores Very difficult    Flowsheet Row ED from 06/27/2022 in [facility] ED from 04/08/2022 in [facility] ED from 02/02/2022 in [facility] EMERGENCY DEPARTMENT  C-SSRS RISK CATEGORY High Risk  No Risk No Risk       Musculoskeletal  Strength & Muscle Tone: within normal limits Gait & Station: normal Patient leans: N/A  Psychiatric Specialty Exam  Presentation  General Appearance: Appropriate for Environment; Casual   Eye Contact:Good   Speech:Clear and Coherent; Normal Rate   Speech Volume:Normal   Handedness:Right    Mood and Affect  Mood:Anxious   Affect:Appropriate; Congruent    Thought Process  Thought Processes:Coherent; Goal Directed; Linear   Descriptions of Associations:Intact   Orientation:Full (Time, Place and Person)   Thought Content:Logical      Hallucinations:No data recorded  Ideas of Reference:None   Suicidal Thoughts:No data recorded  Homicidal Thoughts:No data recorded   Sensorium  Memory:Immediate Good; Recent Good; Remote Good   Judgment:Fair   Insight:Fair    Executive Functions  Concentration:Good   Attention Span:Good   Recall:Good   Fund of Knowledge:Good   Language:Good    Psychomotor Activity  Psychomotor Activity:No data recorded   Assets  Assets:Communication Skills; Desire for Improvement; Financial Resources/Insurance; Physical Health; Resilience; Social Support    Sleep  Sleep:No data recorded   No data recorded   Physical Exam   Review of Systems   Respiratory:  Negative for shortness of breath.   Cardiovascular:  Negative for chest pain.  Gastrointestinal:  Positive for diarrhea. Negative for abdominal pain, constipation, heartburn, nausea and vomiting.  Neurological:  Negative for headaches.   Blood pressure 102/62, pulse 84, temperature 98.3 F (36.8 C), temperature source Oral, resp. rate 20, SpO2 100 %. There is no height or weight on file to calculate BMI.  Demographic Factors:  Adolescent or young adult, Low socioeconomic status, and Unemployed  Loss Factors: Financial problems/change in socioeconomic status  Historical Factors: Impulsivity  Risk Reduction Factors:   Positive social support and Positive coping skills or problem solving skills  Continued Clinical Symptoms:  Alcohol/Substance Abuse/Dependencies More than one psychiatric diagnosis Unstable or Poor Therapeutic Relationship  Cognitive Features That Contribute To Risk:  Closed-mindedness    Suicide Risk:  Mild:  Suicidal ideation of limited frequency, intensity, duration, and specificity.  There are no identifiable plans, no associated intent, mild dysphoria and related symptoms, good self-control (both objective and subjective assessment), few other risk factors, and identifiable protective factors, including available and accessible social support.  Plan Of Care/Follow-up recommendations:   Follow-up recommendations:   Activity:  as tolerated Diet:  heart healthy   Comments:  Prescriptions were given at discharge.  Patient is agreeable with the discharge plan.  Patient was given an opportunity to ask questions.  Patient appears to feel comfortable with discharge and denies any current suicidal or homicidal thoughts.    Patient is instructed prior to discharge to: Take all medications as prescribed by mental healthcare provider. Report any adverse effects and or reactions from the medicines to outpatient provider promptly. In the event of worsening  symptoms, patient is instructed to call the crisis hotline, 911 and or go to the nearest ED for appropriate evaluation and treatment of symptoms. Patient is to follow-up with primary care provider for other medical issues, concerns and or health care needs.   Park Pope, MD 07/01/2022, 1:47 PM

## 2022-07-01 NOTE — Discharge Instructions (Addendum)
Dear Ms. Tamara Guzman,  It was a pleasure taking care of you while in the hospital.  You were admitted due to depression and difficulties controlling mood as well as suicidal ideation.  You were started on lithium which should help with your mood swings but will also be started at [facility] on 07/05/22 at 10:00 AM. Please go to the scheduled appointment.   Take all medications as prescribed. Report any adverse effects and or reactions from the medicines to your outpatient provider promptly. Do not engage in alcohol and or illegal drug use while on prescription medicines. In the event of worsening symptoms, call the crisis hotline, 911 and or go to the nearest ED for appropriate evaluation and treatment of symptoms. follow-up with your primary care provider for your other medical issues, concerns and or health care needs  Take care! [facility]BC

## 2022-07-01 NOTE — ED Notes (Signed)
Patient remains asleep in bed at present.  No complaints.  Pending discharge later in AM

## 2022-07-05 ENCOUNTER — Telehealth (HOSPITAL_COMMUNITY): Payer: Self-pay | Admitting: Professional

## 2022-07-05 ENCOUNTER — Ambulatory Visit (HOSPITAL_COMMUNITY): Payer: No Payment, Other

## 2022-07-05 ENCOUNTER — Encounter (HOSPITAL_COMMUNITY): Payer: Self-pay

## 2022-07-21 ENCOUNTER — Other Ambulatory Visit (HOSPITAL_COMMUNITY)
Admission: EM | Admit: 2022-07-21 | Discharge: 2022-07-21 | Disposition: A | Discharge status: Home / Self Care | Payer: Medicaid Other | Attending: Psychiatry | Admitting: Psychiatry

## 2022-07-21 DIAGNOSIS — F3132 Bipolar disorder, current episode depressed, moderate: Secondary | ICD-10-CM

## 2022-07-21 DIAGNOSIS — F4310 Post-traumatic stress disorder, unspecified: Secondary | ICD-10-CM

## 2022-07-21 DIAGNOSIS — R45851 Suicidal ideations: Secondary | ICD-10-CM

## 2022-07-21 DIAGNOSIS — Z20822 Contact with and (suspected) exposure to covid-19: Secondary | ICD-10-CM | POA: Diagnosis not present

## 2022-07-21 DIAGNOSIS — F332 Major depressive disorder, recurrent severe without psychotic features: Secondary | ICD-10-CM | POA: Diagnosis not present

## 2022-07-21 DIAGNOSIS — F411 Generalized anxiety disorder: Secondary | ICD-10-CM

## 2022-07-21 DIAGNOSIS — Z9151 Personal history of suicidal behavior: Secondary | ICD-10-CM | POA: Insufficient documentation

## 2022-07-21 DIAGNOSIS — F1290 Cannabis use, unspecified, uncomplicated: Secondary | ICD-10-CM

## 2022-07-21 DIAGNOSIS — M62838 Other muscle spasm: Secondary | ICD-10-CM | POA: Insufficient documentation

## 2022-07-21 DIAGNOSIS — F3112 Bipolar disorder, current episode manic without psychotic features, moderate: Secondary | ICD-10-CM | POA: Diagnosis present

## 2022-07-21 DIAGNOSIS — F909 Attention-deficit hyperactivity disorder, unspecified type: Secondary | ICD-10-CM

## 2022-07-21 DIAGNOSIS — F430 Acute stress reaction: Secondary | ICD-10-CM | POA: Insufficient documentation

## 2022-07-21 DIAGNOSIS — E876 Hypokalemia: Secondary | ICD-10-CM | POA: Insufficient documentation

## 2022-07-21 LAB — CBC WITH DIFFERENTIAL/PLATELET
Abs Immature Granulocytes: 0.01 10*3/uL (ref 0.00–0.07)
Basophils Absolute: 0 10*3/uL (ref 0.0–0.1)
Basophils Relative: 1 %
Eosinophils Absolute: 0.1 10*3/uL (ref 0.0–0.5)
Eosinophils Relative: 2 %
HCT: 34.6 % — ABNORMAL LOW (ref 36.0–46.0)
Hemoglobin: 11.4 g/dL — ABNORMAL LOW (ref 12.0–15.0)
Immature Granulocytes: 0 %
Lymphocytes Relative: 43 %
Lymphs Abs: 1.9 10*3/uL (ref 0.7–4.0)
MCH: 26.8 pg (ref 26.0–34.0)
MCHC: 32.9 g/dL (ref 30.0–36.0)
MCV: 81.4 fL (ref 80.0–100.0)
Monocytes Absolute: 0.5 10*3/uL (ref 0.1–1.0)
Monocytes Relative: 11 %
Neutro Abs: 1.9 10*3/uL (ref 1.7–7.7)
Neutrophils Relative %: 43 %
Platelets: 319 10*3/uL (ref 150–400)
RBC: 4.25 MIL/uL (ref 3.87–5.11)
RDW: 14.4 % (ref 11.5–15.5)
WBC: 4.3 10*3/uL (ref 4.0–10.5)
nRBC: 0 % (ref 0.0–0.2)

## 2022-07-21 LAB — COMPREHENSIVE METABOLIC PANEL
ALT: 10 U/L (ref 0–44)
AST: 22 U/L (ref 15–41)
Albumin: 4.1 g/dL (ref 3.5–5.0)
Alkaline Phosphatase: 53 U/L (ref 38–126)
Anion gap: 9 (ref 5–15)
BUN: 8 mg/dL (ref 6–20)
CO2: 23 mmol/L (ref 22–32)
Calcium: 8.7 mg/dL — ABNORMAL LOW (ref 8.9–10.3)
Chloride: 106 mmol/L (ref 98–111)
Creatinine, Ser: 0.84 mg/dL (ref 0.44–1.00)
GFR, Estimated: >60 mL/min (ref >60)
Glucose, Bld: 89 mg/dL (ref 70–99)
Potassium: 3.2 mmol/L — ABNORMAL LOW (ref 3.5–5.1)
Sodium: 138 mmol/L (ref 135–145)
Total Bilirubin: 1 mg/dL (ref 0.3–1.2)
Total Protein: 7 g/dL (ref 6.5–8.1)

## 2022-07-21 LAB — POC SARS CORONAVIRUS 2 AG: SARSCOV2ONAVIRUS 2 AG: NEGATIVE

## 2022-07-21 LAB — RESP PANEL BY RT-PCR (FLU A&B, COVID) ARPGX2
Influenza A by PCR: NEGATIVE
Influenza B by PCR: NEGATIVE
SARS Coronavirus 2 by RT PCR: NEGATIVE

## 2022-07-21 MED ORDER — HYDROXYZINE HCL 25 MG PO TABS: 25.0000 mg | ORAL_TABLET | Freq: Three times a day (TID) | ORAL | 0 refills | Status: DC | PRN

## 2022-07-21 MED ORDER — ACETAMINOPHEN 325 MG PO TABS: 650.0000 mg | ORAL_TABLET | Freq: Four times a day (QID) | ORAL | Status: DC | PRN

## 2022-07-21 MED ORDER — MAGNESIUM HYDROXIDE 400 MG/5ML PO SUSP: 30.0000 mL | Freq: Every day | ORAL | Status: DC | PRN

## 2022-07-21 MED ORDER — ALUM & MAG HYDROXIDE-SIMETH 200-200-20 MG/5ML PO SUSP: 30.0000 mL | ORAL | Status: DC | PRN

## 2022-07-21 MED ORDER — POTASSIUM CHLORIDE CRYS ER 20 MEQ PO TBCR
40.0000 meq | EXTENDED_RELEASE_TABLET | Freq: Once | ORAL | Status: AC
  Administered 2022-07-21: 40 meq via ORAL
  Filled 2022-07-21: qty 2

## 2022-07-21 MED ORDER — HYDROXYZINE HCL 25 MG PO TABS: 25.0000 mg | ORAL_TABLET | Freq: Three times a day (TID) | ORAL | Status: DC | PRN

## 2022-07-21 NOTE — Discharge Instructions (Addendum)

## 2022-07-21 NOTE — ED Provider Notes (Signed)
FBC/OBS ASAP Discharge Summary  Date and Time: 07/21/2022 11:21 AM  Name: Tamara Guzman  MRN:  [id number]   Discharge Diagnoses:  Final diagnoses:  Bipolar affective disorder, currently depressed, moderate (HCC)  PTSD (post-traumatic stress disorder)  GAD (generalized anxiety disorder)  Attention deficit hyperactivity disorder (ADHD), unspecified ADHD type  Cannabis use disorder  Suicidal ideations    Subjective: Patient seen and assessed at bedside.  Patient reports wanting to discharge.  Patient denies SI/HI/AVH.  Patient has outpatient [facility] therapy on August 26.  Patient is able to contract for safety.  Patient will be returning back to mom's house.  Reports she has other arrangements and plans that she needs to take care of and cannot stay in hospital at this time.  Patient offers good insight into her anger and irritability as well as recent stressors that may have caused her to become more labile including recent passing of childhood friend.  Stay Summary by Problem List Tamara Guzman is a 21 y.o. female with reported history of GAD, bipolar disorder, PTSD, ADHD, cannabis use disorder, previous suicide attempt with ciprofloxicin, latuda, and clonidine who was admitted to the [facility] for worsening depression and SI. Hospital course is detailed below:  Acute Stress Disorder Given patient's recent finding out of her friend passing, patient's mood symptoms appear to be consistent with acute stress disorder.  Encourage patient to be started on a mood stabilizer but patient refused at this time.  Patient was amenable to being prescribed hydroxyzine for anxiety at least.  Patient understands that she will need to engage in therapy and was open to being referred again to IOP/PHP to follow-up with anger management classes.  Patient would likely have benefited from continued hospitalization and FBC but I can appreciate her insight and desire to improve and manage her symptoms  outpatient.  Hypokalemia K 3.2. Repleted with kdur 40 meq.  Total Time spent with patient: 45 minutes  Past Psychiatric History: see below Past Medical History:  Past Medical History:  Diagnosis Date   ADHD    Anxiety    Bipolar 1 disorder (HCC)    Depression    Obesity    No past surgical history on file. Family History:  Family History  Problem Relation Age of Onset   Hypertension Mother    Diabetes Mother    Healthy Father    Family Psychiatric History: unknown Social History:  Social History   Substance and Sexual Activity  Alcohol Use Not Currently     Social History   Substance and Sexual Activity  Drug Use Yes   Types: Marijuana    Social History   Socioeconomic History   Marital status: Single    Spouse name: Not on file   Number of children: Not on file   Years of education: Not on file   Highest education level: Not on file  Occupational History   Not on file  Tobacco Use   Smoking status: Never   Smokeless tobacco: Never  Vaping Use   Vaping Use: Never used  Substance and Sexual Activity   Alcohol use: Not Currently   Drug use: Yes    Types: Marijuana   Sexual activity: Yes    Birth control/protection: None  Other Topics Concern   Not on file  Social History Narrative   Not on file   Social Determinants of Health   Financial Resource Strain: Not on file  Food Insecurity: Not on file  Transportation Needs: Not on file  Physical Activity: Not on file  Stress: Not on file  Social Connections: Not on file   SDOH:  SDOH Screenings   Alcohol Screen: Low Risk  (04/08/2020)   Alcohol Screen    Last Alcohol Screening Score (AUDIT): 2  Depression (PHQ2-9): High Risk (07/21/2022)   Depression (PHQ2-9)    PHQ-2 Score: 11  Financial Resource Strain: Not on file  Food Insecurity: Not on file  Housing: Not on file  Physical Activity: Not on file  Social Connections: Not on file  Stress: Not on file  Tobacco Use: Low Risk  (04/08/2022)    Patient History    Smoking Tobacco Use: Never    Smokeless Tobacco Use: Never    Passive Exposure: Not on file  Transportation Needs: Not on file    Tobacco Cessation:  N/A, patient does not currently use tobacco products  Current Medications:  Current Facility-Administered Medications  Medication Dose Route Frequency Provider Last Rate Last Admin   acetaminophen (TYLENOL) tablet 650 mg  650 mg Oral Q6H PRN Onuoha, Chinwendu V, NP       alum & mag hydroxide-simeth (MAALOX/MYLANTA) 200-200-20 MG/5ML suspension 30 mL  30 mL Oral Q4H PRN Onuoha, Chinwendu V, NP       hydrOXYzine (ATARAX) tablet 25 mg  25 mg Oral TID PRN Onuoha, Chinwendu V, NP       magnesium hydroxide (MILK OF MAGNESIA) suspension 30 mL  30 mL Oral Daily PRN Onuoha, Chinwendu V, NP       Current Outpatient Medications  Medication Sig Dispense Refill   ETONOGESTREL [place] 1 each by Subdermal route once. Pt states this was placed in her Right upper arm 35yrs ago and it it time to be removed      PTA Medications: (Not in a hospital admission)       07/21/2022    5:34 AM 06/29/2022    3:08 PM 06/27/2022   11:40 AM  Depression screen PHQ 2/9  Decreased Interest 2 0 2  Down, Depressed, Hopeless 2 0 3  PHQ - 2 Score 4 0 5  Altered sleeping 1 0 3  Tired, decreased energy 1 0 2  Change in appetite 1 0 3  Feeling bad or failure about yourself  1 0 2  Trouble concentrating 1 1 3   Moving slowly or fidgety/restless 1 0 0  Suicidal thoughts 1 0 3  PHQ-9 Score 11 1 21   Difficult doing work/chores Somewhat difficult Somewhat difficult Very difficult    Flowsheet Row ED from 07/21/2022 in [facility] ED from 06/27/2022 in [facility] from 04/08/2022 in [facility] RISK CATEGORY Low Risk High Risk No Risk       Musculoskeletal  Strength & Muscle Tone: within normal limits Gait & Station: normal Patient leans: N/A  Psychiatric  Specialty Exam  Presentation  General Appearance: Casual   Eye Contact:Fair   Speech:Clear and Coherent   Speech Volume:Normal   Handedness:Right    Mood and Affect  Mood:Depressed   Affect:Congruent    Thought Process  Thought Processes:Coherent   Descriptions of Associations:Intact   Orientation:Full (Time, Place and Person)   Thought Content:WDL      Hallucinations:Hallucinations: None   Ideas of Reference:None   Suicidal Thoughts:Suicidal Thoughts: Yes, Passive SI Passive Intent and/or Plan: Without Plan; Without Intent   Homicidal Thoughts:Homicidal Thoughts: No    Sensorium  Memory:Immediate Good; Recent Good   Judgment:Poor   Insight:Poor  Executive Functions  Concentration:Fair   Attention Span:Fair   Recall:Fair   [employer] of Knowledge:Fair   Language:Fair    Psychomotor Activity  Psychomotor Activity:Psychomotor Activity: Normal    Assets  Assets:Communication Skills; Desire for Improvement; Financial Resources/Insurance; Housing; Social Support    Sleep  Sleep:Sleep: Fair    Nutritional Assessment (For OBS and FBC admissions only) Has the patient had a weight loss or gain of 10 pounds or more in the last 3 months?: No Has the patient had a decrease in food intake/or appetite?: No Does the patient have dental problems?: No Does the patient have eating habits or behaviors that may be indicators of an eating disorder including binging or inducing vomiting?: No Has the patient recently lost weight without trying?: 0 Has the patient been eating poorly because of a decreased appetite?: 0 Malnutrition Screening Tool Score: 0     Physical Exam  Physical Exam Vitals and nursing note reviewed.  Constitutional:      General: She is not in acute distress.    Appearance: She is well-developed.  HENT:     Head: Normocephalic and atraumatic.  Eyes:     Conjunctiva/sclera: Conjunctivae normal.   Cardiovascular:     Rate and Rhythm: Normal rate and regular rhythm.     Heart sounds: No murmur heard. Pulmonary:     Effort: Pulmonary effort is normal. No respiratory distress.     Breath sounds: Normal breath sounds.  Abdominal:     Palpations: Abdomen is soft.     Tenderness: There is no abdominal tenderness.  Musculoskeletal:        General: No swelling.     Cervical back: Neck supple.  Skin:    General: Skin is warm and dry.     Capillary Refill: Capillary refill takes less than 2 seconds.  Neurological:     Mental Status: She is alert.  Psychiatric:        Mood and Affect: Mood normal.    Review of Systems  Respiratory:  Negative for shortness of breath.   Cardiovascular:  Negative for chest pain.  Gastrointestinal:  Negative for abdominal pain, constipation, diarrhea, heartburn, nausea and vomiting.  Neurological:  Negative for headaches.   Blood pressure 107/75, pulse 72, temperature 98.1 F (36.7 C), resp. rate 18, SpO2 100 %. There is no height or weight on file to calculate BMI.  Demographic Factors:  Adolescent or young adult and Low socioeconomic status  Loss Factors: Financial problems/change in socioeconomic status  Historical Factors: Impulsivity  Risk Reduction Factors:   Employed, Living with another person, especially a relative, Positive social support, and Positive coping skills or problem solving skills  Continued Clinical Symptoms:  Alcohol/Substance Abuse/Dependencies More than one psychiatric diagnosis  Cognitive Features That Contribute To Risk:  None    Suicide Risk:  Mild:  Suicidal ideation of limited frequency, intensity, duration, and specificity.  There are no identifiable plans, no associated intent, mild dysphoria and related symptoms, good self-control (both objective and subjective assessment), few other risk factors, and identifiable protective factors, including available and accessible social support.  Plan Of  Care/Follow-up recommendations:   Follow-up recommendations:   Activity:  as tolerated Diet:  heart healthy   Comments:  Prescriptions were given at discharge.  Patient is agreeable with the discharge plan.  Patient was given an opportunity to ask questions.  Patient appears to feel comfortable with discharge and denies any current suicidal or homicidal thoughts.    Patient is instructed prior to discharge  to: Take all medications as prescribed by mental healthcare provider. Report any adverse effects and or reactions from the medicines to outpatient provider promptly. In the event of worsening symptoms, patient is instructed to call the crisis hotline, 911 and or go to the nearest ED for appropriate evaluation and treatment of symptoms. Patient is to follow-up with primary care provider for other medical issues, concerns and or health care needs.   Park Pope, MD 07/21/2022, 11:21 AM
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
## 2022-07-21 NOTE — Discharge Summary (Signed)
[name] to be D/C'd Home per MD order. Discussed with the patient and all questions fully answered. An After Visit Summary was printed and given to the patient. Medication sample and script were also given to patient. Patient escorted out and D/C home via taxi cab.  Dickie La  07/21/2022 12:46 PM

## 2022-07-21 NOTE — Progress Notes (Signed)
   07/21/22 0457  [facility] Triage Screening (Walk-ins at [facility] only)  How Did You Hear About [place]? Self  What Is the Reason for Your Visit/Call Today? Tamara Guzman is a 21 year old female presenting as a voluntary walk-in to [facility] due to [facility] with no plan. Patient onset of SI was 2-3 months ago and has worsened since her best friend and boyfriends brother was killed 1 week ago. Patient reports grief/loss. Patient reported worsening depressive symptoms. Patient was inpatient at [facility] 3 weeks ago. Patient reported SI as early as 21 years old. Patient attempted overdose on pills in 2021. Patient has her first therapy appointment at [facility] on 08/06/22. Patient resides with cousin and cousins 2 children. Patient is currently employed at a [employer] and reports no work related stressors, stating "that's the only place I have no stress". Patient denied access to guns.  How Long Has This Been Causing You Problems? 1 wk - 1 month  Have You Recently Had Any Thoughts About Hurting Yourself? Yes  How long ago did you have thoughts about hurting yourself? current SI  Are You Planning to Commit Suicide/Harm Yourself At This time? No  Have you Recently Had Thoughts About Hurting Someone [name]? No  Are You Planning To Harm Someone At This Time? No  Are you currently experiencing any auditory, visual or other hallucinations? No  Have You Used Any Alcohol or Drugs in the Past 24 Hours? No  Do you have any current medical co-morbidities that require immediate attention? No  Clinician description of patient physical appearance/behavior: casual / cooperative  What Do You Feel Would Help You the Most Today? Treatment for Depression or other mood problem  If access to [facility] was not available, would you have sought care in the Emergency Department? No  Determination of Need Urgent (48 hours)  Options For Referral Inpatient Hospitalization;Medication  Management;Outpatient Therapy;Other: Comment (Observation)

## 2022-07-21 NOTE — ED Notes (Signed)
Pt unable to provide urine sample at this time; specimen cup provided. Pt awaiting PCR COVID results to transfer to [facility].

## 2022-07-21 NOTE — ED Provider Notes (Signed)
Facility Based Crisis Admission H&P  Date: 07/21/22 Patient Name: Tamara Guzman MRN: [id number] Chief Complaint: " I'm having suicidal thoughts".  Chief Complaint  Patient presents with   Suicidal   Depression      Diagnoses:  Final diagnoses:  Bipolar affective disorder, currently depressed, moderate (HCC)  PTSD (post-traumatic stress disorder)  GAD (generalized anxiety disorder)    HPI: Tamara Guzman is a 21 y.o. female with reported history of GAD, bipolar disorder, PTSD, ADHD, cannabis use disorder, previous suicide attempt with ciprofloxicin, latuda, and clonidine who presented voluntarily as a walk-in to [facility] with complaints of passive SI without plan for the past several days because she lost her childhood friend, and found out her friend's brother had passed away 6 days earlier.    Per chart review, pt was recently admitted at the [facility] between 06/27/22-07/01/22 for worsening depression and SI.    Pt reports " This situation is making me very depressed and sad and " I'm having suicidal thoughts, but no plan". " I need in-patient treatment".  Pt denies HI, denies AVH. Pt reports " I'm trying to deal with stuff, and my mom told me she didn't really care about what I was talking about".    Pt reports she currently lives with her cousins. Pt reports she stays at her mom's house sometimes, and her mom lives with a boyfriend. Pt denies access or means to firearms at both residences, stating "we have kids over there, we can't have guns".   Pt reports she does not see a psychiatrist presently and does not take any psychiatric medications. Pt reports "I was here about 3 weeks ago, you guys put me on Lithium, and I stopped taking that shit because it made my legs lock up with muscle spasms and shit".  Pt denies any other medication use.   Pt denies illicit drug use, denies nicotine use or Vape use.  Pt reports her sleep and appetite as fair, stating "I stress eat. Pt reports  she works at [employer].   Pt reports she has a new therapy appointment coming up August 25th.   Support, encouragement and reassurance provided about on-going stressors.  On evaluation, patient is alert, oriented x 3, and cooperative. Speech is clear and coherent. Pt appears casual. Eye contact is fair. Mood is depressed, affect is congruent with mood. Thought process is logical and thought content is coherent. Pt endorses passive SI with no plan, denies HI, denies AVH. There is no indication that the patient is responding to internal stimuli. No delusions elicited during this assessment.    PHQ 2-9:  [place] ED from 07/21/2022 in [facility] ED from 06/27/2022 in [facility]  Thoughts that you would be better off dead, or of hurting yourself in some way Several days Not at all  PHQ-9 Total Score 11 1       Flowsheet Row ED from 07/21/2022 in [facility] ED from 06/27/2022 in [facility] ED from 04/08/2022 in [facility] High Risk No Risk        Total Time spent with patient: 20 minutes  Musculoskeletal  Strength & Muscle Tone: within normal limits Gait & Station: normal Patient leans: N/A  Psychiatric Specialty Exam  Presentation General Appearance: Casual  Eye Contact:Fair  Speech:Clear and Coherent  Speech Volume:Normal  Handedness:Right   Mood and Affect  Mood:Depressed  Affect:Congruent   Thought Process  Thought Processes:Coherent  Descriptions of Associations:Intact  Orientation:Full (Time, Place and Person)  Thought Content:WDL    Hallucinations:Hallucinations: None  Ideas of Reference:None  Suicidal Thoughts:Suicidal Thoughts: Yes, Passive SI Passive Intent and/or Plan: Without Plan; Without Intent  Homicidal Thoughts:Homicidal Thoughts: No   Sensorium  Memory:Immediate Good;  Recent Good  Judgment:Poor  Insight:Poor   Executive Functions  Concentration:Fair  Attention Span:Fair  [place]   Psychomotor Activity  Psychomotor Activity:Psychomotor Activity: Normal   Assets  Assets:Communication Skills; Desire for Improvement; Financial Resources/Insurance; Housing; Social Support   Sleep  Sleep:Sleep: Fair   Nutritional Assessment (For OBS and FBC admissions only) Has the patient had a weight loss or gain of 10 pounds or more in the last 3 months?: No Has the patient had a decrease in food intake/or appetite?: No Does the patient have dental problems?: No Does the patient have eating habits or behaviors that may be indicators of an eating disorder including binging or inducing vomiting?: No Has the patient recently lost weight without trying?: 0 Has the patient been eating poorly because of a decreased appetite?: 0 Malnutrition Screening Tool Score: 0    Physical Exam Constitutional:      General: She is not in acute distress.    Appearance: She is not diaphoretic.  HENT:     Head: Normocephalic.     Right Ear: External ear normal.     Left Ear: External ear normal.     Nose: No congestion.  Eyes:     General:        Right eye: No discharge.        Left eye: No discharge.  Cardiovascular:     Rate and Rhythm: Normal rate.  Pulmonary:     Effort: No respiratory distress.  Chest:     Chest wall: No tenderness.  Neurological:     Mental Status: She is alert and oriented to person, place, and time.  Psychiatric:        Attention and Perception: Attention and perception normal.        Mood and Affect: Mood is depressed.        Speech: Speech normal.        Behavior: Behavior is cooperative.        Thought Content: Thought content is not paranoid or delusional. Thought content includes suicidal ideation. Thought content does not include homicidal ideation. Thought content does not include  homicidal or suicidal plan.        Cognition and Memory: Cognition and memory normal.        Judgment: Judgment is impulsive and inappropriate.    Review of Systems  Constitutional:  Negative for chills, diaphoresis and fever.  HENT:  Negative for congestion.   Eyes:  Negative for discharge.  Respiratory:  Negative for cough, shortness of breath and wheezing.   Cardiovascular:  Negative for chest pain and palpitations.  Gastrointestinal:  Negative for diarrhea, nausea and vomiting.  Neurological:  Negative for dizziness, seizures, weakness and headaches.  Psychiatric/Behavioral:  Positive for depression. Negative for hallucinations and suicidal ideas. The patient is not nervous/anxious.     Blood pressure 107/75, pulse 72, temperature 98.1 F (36.7 C), resp. rate 18, SpO2 100 %. There is no height or weight on file to calculate BMI.  Past Psychiatric History: Per chart review "   Is the patient at risk to self? Yes  Has the patient been a risk to self  in the past 6 months? Yes .    Has the patient been a risk to self within the distant past? Yes   Is the patient a risk to others? No   Has the patient been a risk to others in the past 6 months? No   Has the patient been a risk to others within the distant past? No   Past Medical History:  Past Medical History:  Diagnosis Date   ADHD    Anxiety    Bipolar 1 disorder ([place])    Depression    Obesity    No past surgical history on file.  Family History:  Family History  Problem Relation Age of Onset   Hypertension Mother    Diabetes Mother    Healthy Father     Social History:  Social History   Socioeconomic History   Marital status: Single    Spouse name: Not on file   Number of children: Not on file   Years of education: Not on file   Highest education level: Not on file  Occupational History   Not on file  Tobacco Use   Smoking status: Never   Smokeless tobacco: Never  Vaping Use   Vaping Use: Never used   Substance and Sexual Activity   Alcohol use: Not Currently   Drug use: Yes    Types: Marijuana   Sexual activity: Yes    Birth control/protection: None  Other Topics Concern   Not on file  Social History Narrative   Not on file   Social Determinants of Health   Financial Resource Strain: Not on file  Food Insecurity: Not on file  Transportation Needs: Not on file  Physical Activity: Not on file  Stress: Not on file  Social Connections: Not on file  Intimate Partner Violence: Not on file    SDOH:  SDOH Screenings   Alcohol Screen: Low Risk  (04/08/2020)   Alcohol Screen    Last Alcohol Screening Score (AUDIT): 2  Depression (PHQ2-9): High Risk (07/21/2022)   Depression (PHQ2-9)    PHQ-2 Score: 11  Financial Resource Strain: Not on file  Food Insecurity: Not on file  Housing: Not on file  Physical Activity: Not on file  Social Connections: Not on file  Stress: Not on file  Tobacco Use: Low Risk  (04/08/2022)   Patient History    Smoking Tobacco Use: Never    Smokeless Tobacco Use: Never    Passive Exposure: Not on file  Transportation Needs: Not on file    Last Labs:  Admission on 06/27/2022, Discharged on 07/01/2022  Component Date Value Ref Range Status   WBC 06/27/2022 5.8  4.0 - 10.5 K/uL Final   RBC 06/27/2022 4.10  3.87 - 5.11 MIL/uL Final   Hemoglobin 06/27/2022 10.9 (L)  12.0 - 15.0 g/dL Final   HCT 06/27/2022 33.2 (L)  36.0 - 46.0 % Final   MCV 06/27/2022 81.0  80.0 - 100.0 fL Final   MCH 06/27/2022 26.6  26.0 - 34.0 pg Final   MCHC 06/27/2022 32.8  30.0 - 36.0 g/dL Final   RDW 06/27/2022 14.4  11.5 - 15.5 % Final   Platelets 06/27/2022 244  150 - 400 K/uL Final   nRBC 06/27/2022 0.0  0.0 - 0.2 % Final   Neutrophils Relative % 06/27/2022 39  % Final   Neutro Abs 06/27/2022 2.3  1.7 - 7.7 K/uL Final   Lymphocytes Relative 06/27/2022 47  % Final   Lymphs Abs 06/27/2022 2.7  0.7 - 4.0 K/uL Final   Monocytes Relative 06/27/2022 11  % Final   Monocytes  Absolute 06/27/2022 0.6  0.1 - 1.0 K/uL Final   Eosinophils Relative 06/27/2022 2  % Final   Eosinophils Absolute 06/27/2022 0.1  0.0 - 0.5 K/uL Final   Basophils Relative 06/27/2022 1  % Final   Basophils Absolute 06/27/2022 0.0  0.0 - 0.1 K/uL Final   Immature Granulocytes 06/27/2022 0  % Final   Abs Immature Granulocytes 06/27/2022 0.01  0.00 - 0.07 K/uL Final   Performed at [facility], [place] [address] [place], [place] [address]   Sodium 06/27/2022 137  135 - 145 mmol/L Final   Potassium 06/27/2022 3.8  3.5 - 5.1 mmol/L Final   Chloride 06/27/2022 102  98 - 111 mmol/L Final   CO2 06/27/2022 25  22 - 32 mmol/L Final   Glucose, Bld 06/27/2022 87  70 - 99 mg/dL Final   Glucose reference range applies only to samples taken after fasting for at least 8 hours.   BUN 06/27/2022 13  6 - 20 mg/dL Final   Creatinine, Ser 06/27/2022 0.96  0.44 - 1.00 mg/dL Final   Calcium 06/27/2022 8.7 (L)  8.9 - 10.3 mg/dL Final   Total Protein 06/27/2022 6.4 (L)  6.5 - 8.1 g/dL Final   Albumin 06/27/2022 4.0  3.5 - 5.0 g/dL Final   AST 06/27/2022 16  15 - 41 U/L Final   ALT 06/27/2022 10  0 - 44 U/L Final   Alkaline Phosphatase 06/27/2022 62  38 - 126 U/L Final   Total Bilirubin 06/27/2022 0.4  0.3 - 1.2 mg/dL Final   GFR, Estimated 06/27/2022 >60  >60 mL/min Final   Comment: (NOTE) Calculated using the CKD-EPI Creatinine Equation (2021)    Anion gap 06/27/2022 10  5 - 15 Final   Performed at [facility], [place] [address] [place], [place] [address]   Hgb A1c MFr Bld 06/27/2022 4.8  4.8 - 5.6 % Final   Comment: (NOTE) Pre diabetes:          5.7%-6.4%  Diabetes:              >6.4%  Glycemic control for   <7.0% adults with diabetes    Mean Plasma Glucose 06/27/2022 91.06  mg/dL Final   Performed at [facility], [place] [address] [place], [place] [address]   Cholesterol 06/27/2022 142  0 - 200 mg/dL Final   Triglycerides 06/27/2022 65  <150 mg/dL Final   HDL 06/27/2022 50  >40  mg/dL Final   Total CHOL/HDL Ratio 06/27/2022 2.8  RATIO Final   VLDL 06/27/2022 13  0 - 40 mg/dL Final   LDL Cholesterol 06/27/2022 79  0 - 99 mg/dL Final   Comment:        Total Cholesterol/HDL:CHD Risk Coronary Heart Disease Risk Table                     Men   Women  1/2 Average Risk   3.4   3.3  Average Risk       5.0   4.4  2 X Average Risk   9.6   7.1  3 X Average Risk  23.4   11.0        Use the calculated Patient Ratio above and the CHD Risk Table to determine the patient's CHD Risk.        ATP III CLASSIFICATION (LDL):  <100  mg/dL   Optimal  494-496  mg/dL   Near or Above                    Optimal  130-159  mg/dL   Borderline  759-163  mg/dL   High  >846     mg/dL   Very High Performed at [facility], [address], [place], [place] [address]    TSH 06/27/2022 2.326  0.350 - 4.500 uIU/mL Final   Comment: Performed by a 3rd Generation assay with a functional sensitivity of <=0.01 uIU/mL. Performed at [facility], [address] [place], [place] [address]    SARSCOV2ONAVIRUS 2 AG 06/27/2022 NEGATIVE  NEGATIVE Final   Comment: (NOTE) SARS-CoV-2 antigen NOT DETECTED.   Negative results are presumptive.  Negative results do not preclude SARS-CoV-2 infection and should not be used as the sole basis for treatment or other patient management decisions, including infection  control decisions, particularly in the presence of clinical signs and  symptoms consistent with COVID-19, or in those who have been in contact with the virus.  Negative results must be combined with clinical observations, patient history, and epidemiological information. The expected result is Negative.  Fact Sheet for Patients: https://www.jennings-kim.com/  Fact Sheet for Healthcare Providers: https://alexander-rogers.biz/  This test is not yet approved or cleared by the [place] FDA and  has been authorized for detection and/or diagnosis of  SARS-CoV-2 by FDA under an Emergency Use Authorization (EUA).  This EUA will remain in effect (meaning this test can be used) for the duration of  the COV                          ID-19 declaration under Section 564(b)(1) of the Act, 21 U.S.C. section 360bbb-3(b)(1), unless the authorization is terminated or revoked sooner.     Preg Test, Ur 06/27/2022 NEGATIVE  NEGATIVE Final   Comment:        THE SENSITIVITY OF THIS METHODOLOGY IS >24 mIU/mL    Iron 06/28/2022 42  28 - 170 ug/dL Final   TIBC 79/39/0300 346  250 - 450 ug/dL Final   Saturation Ratios 06/28/2022 12  10.4 - 31.8 % Final   UIBC 06/28/2022 304  ug/dL Final   Performed at [facility], [address] [place], [place] [address]   Ferritin 06/28/2022 30  11 - 307 ng/mL Final   Performed at [facility], [address] [place], [place] [address]   Lithium Lvl 07/01/2022 0.57 (L)  0.60 - 1.20 mmol/L Final   Performed at [facility], [address], [place], [place] [address]  Hospital Outpatient Visit on 06/27/2022  Component Date Value Ref Range Status   SARS Coronavirus 2 by RT PCR 06/27/2022 NEGATIVE  NEGATIVE Final   Comment: (NOTE) SARS-CoV-2 target nucleic acids are NOT DETECTED.  The SARS-CoV-2 RNA is generally detectable in upper and lower respiratory specimens during the acute phase of infection. The lowest concentration of SARS-CoV-2 viral copies this assay can detect is 250 copies / mL. A negative result does not preclude SARS-CoV-2 infection and should not be used as the sole basis for treatment or other patient management decisions.  A negative result may occur with improper specimen collection / handling, submission of specimen other than nasopharyngeal swab, presence of viral mutation(s) within the areas targeted by this assay, and inadequate number of viral copies (<250 copies / mL). A  negative result must be combined with clinical observations, patient history, and epidemiological  information.  Fact Sheet for Patients:   RoadLapTop.co.za  Fact Sheet for Healthcare Providers: http://kim-miller.com/  This test is not yet approved or                           cleared by the [place] FDA and has been authorized for detection and/or diagnosis of SARS-CoV-2 by FDA under an Emergency Use Authorization (EUA).  This EUA will remain in effect (meaning this test can be used) for the duration of the COVID-19 declaration under Section 564(b)(1) of the Act, 21 U.S.C. section 360bbb-3(b)(1), unless the authorization is terminated or revoked sooner.  Performed at [facility], [address] [place], [place] [address]   Admission on 04/08/2022, Discharged on 04/08/2022  Component Date Value Ref Range Status   Glucose, UA 04/08/2022 NEGATIVE  NEGATIVE mg/dL Final   Bilirubin Urine 04/08/2022 NEGATIVE  NEGATIVE Final   Ketones, ur 04/08/2022 NEGATIVE  NEGATIVE mg/dL Final   Specific Gravity, Urine 04/08/2022 1.020  1.005 - 1.030 Final   Hgb urine dipstick 04/08/2022 NEGATIVE  NEGATIVE Final   pH 04/08/2022 8.5 (H)  5.0 - 8.0 Final   Protein, ur 04/08/2022 NEGATIVE  NEGATIVE mg/dL Final   Urobilinogen, UA 04/08/2022 4.0 (H)  0.0 - 1.0 mg/dL Final   Nitrite 24/58/0998 POSITIVE (A)  NEGATIVE Final   Leukocytes,Ua 04/08/2022 TRACE (A)  NEGATIVE Final   Biochemical Testing Only. Please order routine urinalysis from main lab if confirmatory testing is needed.   Neisseria Gonorrhea 04/08/2022 Negative   Final   Chlamydia 04/08/2022 Negative   Final   Trichomonas 04/08/2022 Negative   Final   Bacterial Vaginitis (gardnerella) 04/08/2022 Positive (A)   Final   Candida Vaginitis 04/08/2022 Positive (A)   Final   Candida Glabrata 04/08/2022 Negative   Final   Comment 04/08/2022 Normal Reference Range Bacterial Vaginosis - Negative   Final   Comment 04/08/2022 Normal Reference Range Candida Species - Negative    Final   Comment 04/08/2022 Normal Reference Range Candida Galbrata - Negative   Final   Comment 04/08/2022 Normal Reference Range Trichomonas - Negative   Final   Comment 04/08/2022 Normal Reference Ranger Chlamydia - Negative   Final   Comment 04/08/2022 Normal Reference Range Neisseria Gonorrhea - Negative   Final  Admission on 02/02/2022, Discharged on 02/02/2022  Component Date Value Ref Range Status   Sodium 02/02/2022 135  135 - 145 mmol/L Final   Potassium 02/02/2022 3.6  3.5 - 5.1 mmol/L Final   Chloride 02/02/2022 104  98 - 111 mmol/L Final   CO2 02/02/2022 23  22 - 32 mmol/L Final   Glucose, Bld 02/02/2022 107 (H)  70 - 99 mg/dL Final   Glucose reference range applies only to samples taken after fasting for at least 8 hours.   BUN 02/02/2022 12  6 - 20 mg/dL Final   Creatinine, Ser 02/02/2022 0.98  0.44 - 1.00 mg/dL Final   Calcium 33/82/5053 8.3 (L)  8.9 - 10.3 mg/dL Final   Total Protein 97/67/3419 5.4 (L)  6.5 - 8.1 g/dL Final   Albumin 37/90/2409 3.2 (L)  3.5 - 5.0 g/dL Final   AST 73/53/2992 29  15 - 41 U/L Final   ALT 02/02/2022 14  0 - 44 U/L Final   Alkaline Phosphatase 02/02/2022 46  38 - 126 U/L Final   Total Bilirubin 02/02/2022 0.8  0.3 - 1.2 mg/dL Final   GFR, Estimated 02/02/2022 >60  >60 mL/min Final   Comment: (NOTE) Calculated using the CKD-EPI Creatinine Equation (2021)    Anion gap 02/02/2022 8  5 - 15 Final   Performed at [facility], [place] [address], [place], [place] [address]   WBC 02/02/2022 6.1  4.0 - 10.5 K/uL Final   RBC 02/02/2022 4.80  3.87 - 5.11 MIL/uL Final   Hemoglobin 02/02/2022 13.1  12.0 - 15.0 g/dL Final   HCT 02/02/2022 39.7  36.0 - 46.0 % Final   MCV 02/02/2022 82.7  80.0 - 100.0 fL Final   MCH 02/02/2022 27.3  26.0 - 34.0 pg Final   MCHC 02/02/2022 33.0  30.0 - 36.0 g/dL Final   RDW 02/02/2022 15.0  11.5 - 15.5 % Final   Platelets 02/02/2022 260  150 - 400 K/uL Final   nRBC 02/02/2022 0.0  0.0 - 0.2 % Final   Neutrophils  Relative % 02/02/2022 46  % Final   Neutro Abs 02/02/2022 2.8  1.7 - 7.7 K/uL Final   Lymphocytes Relative 02/02/2022 42  % Final   Lymphs Abs 02/02/2022 2.6  0.7 - 4.0 K/uL Final   Monocytes Relative 02/02/2022 9  % Final   Monocytes Absolute 02/02/2022 0.6  0.1 - 1.0 K/uL Final   Eosinophils Relative 02/02/2022 2  % Final   Eosinophils Absolute 02/02/2022 0.1  0.0 - 0.5 K/uL Final   Basophils Relative 02/02/2022 1  % Final   Basophils Absolute 02/02/2022 0.0  0.0 - 0.1 K/uL Final   Immature Granulocytes 02/02/2022 0  % Final   Abs Immature Granulocytes 02/02/2022 0.02  0.00 - 0.07 K/uL Final   Performed at [facility], [place] [address] [place], [place] [address]   I-stat hCG, quantitative 02/02/2022 <5.0  <5 mIU/mL Final   Comment 3 02/02/2022          Final   Comment:   GEST. AGE      CONC.  (mIU/mL)   <=1 WEEK        5 - 50     2 WEEKS       50 - 500     3 WEEKS       100 - 10,000     4 WEEKS     1,000 - 30,000        FEMALE AND NON-PREGNANT FEMALE:     LESS THAN 5 mIU/mL     Allergies: Other, Chocolate, Cocoa, and Peanut-containing drug products  PTA Medications: (Not in a hospital admission)   Long Term Goals: Improvement in symptoms so as ready for discharge  Short Term Goals: Patient will verbalize feelings in meetings with treatment team members., Patient will attend at least of 50% of the groups daily., Pt will complete the PHQ9 on admission, day 3 and discharge., Patient will participate in completing the [place], Patient will score a low risk of violence for 24 hours prior to discharge, and Patient will take medications as prescribed daily.  Medical Decision Making  Recommend admit to the [facility] for mood stabilization, safety monitoring and medication management.   Lab Orders         Resp Panel by RT-PCR (Flu A&B, Covid) Anterior Nasal Swab         CBC with Differential/Platelet         Comprehensive metabolic panel          Pregnancy, urine  POCT Urine Drug Screen - (I-Screen)      Prn meds -Tylenol 650 mg PO q6h prn pain - Maloox 30 ml q4h prn indigestion -Atarax 25 mg PO TID prn anxiety -MOM 30 ml PO daily prn mild constipation    Recommendations  Based on my evaluation the patient does not appear to have an emergency medical condition.  Disp-Pt admitted to the [facility].   Randon Goldsmith, NP 07/21/22  5:56 AM
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
## 2022-07-30 ENCOUNTER — Telehealth (HOSPITAL_COMMUNITY): Payer: Self-pay | Admitting: General Practice

## 2022-07-30 NOTE — BH Assessment (Signed)
Care Management - [facility]  Follow Up Discharges   Writer attempted to make contact with patient today and was unsuccessful.  Writer left a HIPPA compliant voice message.    Per chart review, patient has her first therapy appointment at [facility] on 08-06-2022.

## 2022-09-27 ENCOUNTER — Encounter (HOSPITAL_COMMUNITY): Payer: Self-pay | Admitting: Emergency Medicine

## 2022-09-27 ENCOUNTER — Other Ambulatory Visit (HOSPITAL_COMMUNITY): Payer: Self-pay

## 2022-09-27 ENCOUNTER — Ambulatory Visit (HOSPITAL_COMMUNITY)
Admission: EM | Admit: 2022-09-27 | Discharge: 2022-09-27 | Disposition: A | Discharge status: Home / Self Care | Payer: Medicaid Other | Attending: Physician Assistant | Admitting: Physician Assistant

## 2022-09-27 DIAGNOSIS — R8290 Unspecified abnormal findings in urine: Secondary | ICD-10-CM

## 2022-09-27 DIAGNOSIS — Z202 Contact with and (suspected) exposure to infections with a predominantly sexual mode of transmission: Secondary | ICD-10-CM | POA: Diagnosis present

## 2022-09-27 DIAGNOSIS — J0140 Acute pansinusitis, unspecified: Secondary | ICD-10-CM

## 2022-09-27 DIAGNOSIS — Z113 Encounter for screening for infections with a predominantly sexual mode of transmission: Secondary | ICD-10-CM

## 2022-09-27 DIAGNOSIS — Z3202 Encounter for pregnancy test, result negative: Secondary | ICD-10-CM | POA: Diagnosis not present

## 2022-09-27 DIAGNOSIS — R042 Hemoptysis: Secondary | ICD-10-CM

## 2022-09-27 LAB — POCT URINALYSIS DIPSTICK, ED / UC
Bilirubin Urine: NEGATIVE
Glucose, UA: NEGATIVE mg/dL
Hgb urine dipstick: NEGATIVE
Ketones, ur: NEGATIVE mg/dL
Leukocytes,Ua: NEGATIVE
Nitrite: NEGATIVE
Protein, ur: NEGATIVE mg/dL
Specific Gravity, Urine: 1.02 (ref 1.005–1.030)
Urobilinogen, UA: 1 mg/dL (ref 0.0–1.0)
pH: 7.5 (ref 5.0–8.0)

## 2022-09-27 LAB — HEPATITIS C ANTIBODY: HCV Ab: NONREACTIVE

## 2022-09-27 LAB — HIV ANTIBODY (ROUTINE TESTING W REFLEX): HIV Screen 4th Generation wRfx: NONREACTIVE

## 2022-09-27 LAB — POC URINE PREG, ED: Preg Test, Ur: NEGATIVE

## 2022-09-27 MED ORDER — AMOXICILLIN-POT CLAVULANATE 875-125 MG PO TABS
1.0000 | ORAL_TABLET | Freq: Two times a day (BID) | ORAL | 0 refills | Status: DC
  Filled 2022-09-27: qty 14, 7d supply, fill #0

## 2022-09-27 NOTE — Discharge Instructions (Signed)
We are treating you for sinus infection.  Please take Augmentin twice daily for 7 days.  Use over-the-counter medications including Tylenol, Mucinex, Flonase for additional symptom relief.  Make sure you rest and drink plenty of fluid.  If your symptoms are not improving by next week return for reevaluation.  If anything worsens and you have high fever, chest pain, shortness of breath, nausea, vomiting you need to be seen immediately.  Your urine pregnancy test was negative.  There was no evidence of infection on UA.  I recommend that you push fluids to make sure that you do not have concentrated urine which could be causing this fall.  We will contact you if we need to arrange any additional treatment based on your swab and blood testing results.  I do recommend that you have repeat HIV testing in 4 weeks, 3 months, 6 months as we discussed.
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
## 2022-09-27 NOTE — ED Triage Notes (Signed)
Pt had headache, ears popping, and coughing up mucous with blood in it for a week.   Pt also wants HIV testing due to exposure from known source.

## 2022-09-28 LAB — CERVICOVAGINAL ANCILLARY ONLY
Bacterial Vaginitis (gardnerella): POSITIVE — AB
Candida Glabrata: NEGATIVE
Candida Vaginitis: POSITIVE — AB
Chlamydia: NEGATIVE
Comment: NEGATIVE
Comment: NEGATIVE
Comment: NEGATIVE
Comment: NEGATIVE
Comment: NEGATIVE
Comment: NORMAL
Neisseria Gonorrhea: NEGATIVE
Trichomonas: POSITIVE — AB

## 2022-09-28 LAB — RPR: RPR Ser Ql: NONREACTIVE

## 2022-09-29 ENCOUNTER — Other Ambulatory Visit (HOSPITAL_COMMUNITY): Payer: Self-pay

## 2022-09-29 ENCOUNTER — Telehealth (HOSPITAL_COMMUNITY): Payer: Self-pay | Admitting: Emergency Medicine

## 2022-09-29 MED ORDER — FLUCONAZOLE 150 MG PO TABS
150.0000 mg | ORAL_TABLET | Freq: Once | ORAL | 0 refills | Status: AC
  Filled 2022-09-29: qty 2, 2d supply, fill #0

## 2022-09-29 MED ORDER — METRONIDAZOLE 500 MG PO TABS
500.0000 mg | ORAL_TABLET | Freq: Two times a day (BID) | ORAL | 0 refills | Status: DC
  Filled 2022-09-29: qty 14, 7d supply, fill #0

## 2022-10-07 ENCOUNTER — Other Ambulatory Visit (HOSPITAL_COMMUNITY): Payer: Self-pay

## 2022-11-03 ENCOUNTER — Encounter (HOSPITAL_COMMUNITY): Payer: Self-pay

## 2022-11-03 ENCOUNTER — Emergency Department (HOSPITAL_COMMUNITY): Payer: Medicaid Other

## 2022-11-03 ENCOUNTER — Emergency Department (HOSPITAL_COMMUNITY)
Admission: EM | Admit: 2022-11-03 | Discharge: 2022-11-03 | Disposition: A | Discharge status: Home / Self Care | Payer: Medicaid Other | Attending: Student | Admitting: Student

## 2022-11-03 ENCOUNTER — Other Ambulatory Visit: Payer: Self-pay

## 2022-11-03 DIAGNOSIS — W228XXA Striking against or struck by other objects, initial encounter: Secondary | ICD-10-CM | POA: Insufficient documentation

## 2022-11-03 DIAGNOSIS — S90931A Unspecified superficial injury of right great toe, initial encounter: Secondary | ICD-10-CM | POA: Diagnosis present

## 2022-11-03 DIAGNOSIS — S90111A Contusion of right great toe without damage to nail, initial encounter: Secondary | ICD-10-CM | POA: Insufficient documentation

## 2022-11-03 DIAGNOSIS — Z91010 Allergy to peanuts: Secondary | ICD-10-CM | POA: Insufficient documentation

## 2022-11-03 DIAGNOSIS — S99921A Unspecified injury of right foot, initial encounter: Secondary | ICD-10-CM

## 2022-11-03 MED ORDER — NAPROXEN 500 MG PO TABS: 500.0000 mg | ORAL_TABLET | Freq: Two times a day (BID) | ORAL | 0 refills | Status: DC | PRN

## 2022-11-03 MED ORDER — NAPROXEN 250 MG PO TABS: 500.0000 mg | ORAL_TABLET | Freq: Once | ORAL | Status: DC

## 2022-11-03 NOTE — Discharge Instructions (Signed)
Please read and follow all provided instructions.  You have been seen today for an injury to your right toe.  Tests performed today include: An x-ray of the affected area - does NOT show any broken bones or dislocations.  Vital signs. See below for your results today.   Home care instructions: -- *PRICE in the first 24-48 hours after injury: Protect (with brace, splint, sling), if given by your provider Rest Ice- Do not apply ice pack directly to your skin, place towel or similar between your skin and ice/ice pack. Apply ice for 20 min, then remove for 40 min while awake Compression- Wear brace, elastic bandage, splint as directed by your provider Elevate affected extremity above the level of your heart when not walking around for the first 24-48 hours   Medications:   - Naproxen is a nonsteroidal anti-inflammatory medication that will help with pain and swelling. Be sure to take this medication as prescribed with food, 1 pill every 12 hours,  It should be taken with food, as it can cause stomach upset, and more seriously, stomach bleeding. Do not take other nonsteroidal anti-inflammatory medications with this such as Advil, Motrin, Aleve, Mobic, Goodie Powder, or Motrin.    You make take Tylenol per over the counter dosing with these medications.   We have prescribed you new medication(s) today. Discuss the medications prescribed today with your pharmacist as they can have adverse effects and interactions with your other medicines including over the counter and prescribed medications. Seek medical evaluation if you start to experience new or abnormal symptoms after taking one of these medicines, seek care immediately if you start to experience difficulty breathing, feeling of your throat closing, facial swelling, or rash as these could be indications of a more serious allergic reaction   Follow-up instructions: Please follow-up with your primary care provider or the provided orthopedic  physician (bone specialist) if you continue to have significant pain in 1 week. In this case you may have a more severe injury that requires further care.   Return instructions:  Please return if your digits or extremity are numb or tingling, appear gray or blue, or you have severe pain (also elevate the extremity and loosen splint or wrap if you were given one) Please return if you have redness or fevers.  Please return to the Emergency Department if you experience worsening symptoms.  Please return if you have any other emergent concerns. Additional Information:  Your vital signs today were: BP 101/75 (BP Location: Right Arm)   Pulse 89   Temp (!) 97.5 F (36.4 C) (Oral)   Resp 16   Ht 5\' 2"  (1.575 m)   Wt 77.1 kg   LMP 10/13/2022 (Approximate)   SpO2 100%   BMI 31.09 kg/m  If your blood pressure (BP) was elevated above 135/85 this visit, please have this repeated by your doctor within one month. ---------------
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
## 2022-11-23 ENCOUNTER — Other Ambulatory Visit: Payer: Self-pay

## 2022-11-23 ENCOUNTER — Emergency Department (HOSPITAL_COMMUNITY)
Admission: EM | Admit: 2022-11-23 | Discharge: 2022-11-23 | Discharge status: Against Medical Advice | Payer: Medicaid Other | Attending: Emergency Medicine | Admitting: Emergency Medicine

## 2022-11-23 ENCOUNTER — Encounter (HOSPITAL_COMMUNITY): Payer: Self-pay

## 2022-11-23 DIAGNOSIS — Z1152 Encounter for screening for COVID-19: Secondary | ICD-10-CM | POA: Insufficient documentation

## 2022-11-23 DIAGNOSIS — Z5321 Procedure and treatment not carried out due to patient leaving prior to being seen by health care provider: Secondary | ICD-10-CM | POA: Diagnosis not present

## 2022-11-23 DIAGNOSIS — J029 Acute pharyngitis, unspecified: Secondary | ICD-10-CM | POA: Insufficient documentation

## 2022-11-23 LAB — GROUP A STREP BY PCR: Group A Strep by PCR: NOT DETECTED

## 2022-11-23 LAB — RESP PANEL BY RT-PCR (RSV, FLU A&B, COVID)  RVPGX2
Influenza A by PCR: NEGATIVE
Influenza B by PCR: NEGATIVE
Resp Syncytial Virus by PCR: NEGATIVE
SARS Coronavirus 2 by RT PCR: NEGATIVE

## 2022-11-23 NOTE — ED Provider Notes (Incomplete)
  [facility] EMERGENCY DEPARTMENT Provider Note   CSN: [id number] Arrival date & time: 11/23/22  1731     History {Add pertinent medical, surgical, social history, OB history to HPI:1} Chief Complaint  Patient presents with   Sore Throat    Tamara Guzman is a 21 y.o. female.   Sore Throat       Home Medications Prior to Admission medications   Medication Sig Start Date End Date Taking? Authorizing Provider  amoxicillin-clavulanate (AUGMENTIN) 875-125 MG tablet Take 1 tablet by mouth every 12 (twelve) hours. 09/27/22   Raspet, Noberto Retort, PA-C  metroNIDAZOLE (FLAGYL) 500 MG tablet Take 1 tablet (500 mg total) by mouth 2 (two) times daily. 09/29/22   Lamptey, Britta Mccreedy, MD  naproxen (NAPROSYN) 500 MG tablet Take 1 tablet (500 mg total) by mouth 2 (two) times daily as needed for moderate pain. 11/03/22   Petrucelli, Samantha R, PA-C  ARIPiprazole (ABILIFY) 20 MG tablet Take 1 tablet (20 mg total) by mouth at bedtime. 07/13/18 03/31/20  Leata Mouse, MD  cloNIDine HCl (KAPVAY) 0.1 MG TB12 ER tablet Take 2 tablets (0.2 mg total) by mouth at bedtime. 07/13/18 03/31/20  Leata Mouse, MD  dicyclomine (BENTYL) 20 MG tablet Take 20 mg by mouth 4 (four) times daily. 04/26/18 03/31/20  [provider]  FLUoxetine (PROZAC) 20 MG capsule Take 1 capsule (20 mg total) by mouth every morning. 07/13/18 03/31/20  Leata Mouse, MD  lansoprazole (PREVACID) 15 MG capsule Take 1 capsule (15 mg total) by mouth daily. 10/06/19 03/31/20  Niel Hummer, MD  ranitidine (ZANTAC) 150 MG capsule Take 150 mg by mouth 2 (two) times daily. 04/14/18 03/31/20  [provider]      Allergies    Other, Chocolate, and Peanut-containing drug products    Review of Systems   Review of Systems  Physical Exam Updated Vital Signs BP 107/60 (BP Location: Right Arm)   Pulse 77   Temp 98.3 F (36.8 C)   Resp 18   LMP 10/13/2022 (Approximate)   SpO2 100%   Physical Exam  ED Results / Procedures / Treatments   Labs (all labs ordered are listed, but only abnormal results are displayed) Labs Reviewed  RESP PANEL BY RT-PCR (RSV, FLU A&B, COVID)  RVPGX2  GROUP A STREP BY PCR    EKG None  Radiology No results found.  Procedures Procedures  {Document cardiac monitor, telemetry assessment procedure when appropriate:1}  Medications Ordered in ED Medications - No data to display  ED Course/ Medical Decision Making/ A&P                           Medical Decision Making  ***  {Document critical care time when appropriate:1} {Document review of labs and clinical decision tools ie heart score, Chads2Vasc2 etc:1}  {Document your independent review of radiology images, and any outside records:1} {Document your discussion with family members, caretakers, and with consultants:1} {Document social determinants of health affecting pt's care:1} {Document your decision making why or why not admission, treatments were needed:1} Final Clinical Impression(s) / ED Diagnoses Final diagnoses:  None    Rx / DC Orders ED Discharge Orders     None
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
## 2022-11-23 NOTE — ED Notes (Signed)
Unable to locate pt by sort staff

## 2022-11-23 NOTE — ED Triage Notes (Signed)
Pt came in via POV for sore throat that has been going for the last several days. Endorses nasal congestion & drainage, coughing, green phlegm, denies fevers. A/Ox4.

## 2022-11-24 NOTE — ED Provider Notes (Signed)
    I was informed by nursing that the patient had been unable to be located by staff and had eloped.  Reviewed laboratory workup, negative strep PCR negative COVID, influenza, RSV, no indication for callback.    Ernie Avena, MD 11/24/22 0110

## 2023-03-21 ENCOUNTER — Ambulatory Visit (HOSPITAL_COMMUNITY)
Admission: EM | Admit: 2023-03-21 | Discharge: 2023-03-21 | Disposition: A | Discharge status: Home / Self Care | Payer: Medicaid Other | Attending: Psychiatry | Admitting: Psychiatry

## 2023-03-21 DIAGNOSIS — Z9151 Personal history of suicidal behavior: Secondary | ICD-10-CM | POA: Insufficient documentation

## 2023-03-21 DIAGNOSIS — F419 Anxiety disorder, unspecified: Secondary | ICD-10-CM | POA: Insufficient documentation

## 2023-03-21 DIAGNOSIS — R45851 Suicidal ideations: Secondary | ICD-10-CM | POA: Insufficient documentation

## 2023-03-21 DIAGNOSIS — Z5900 Homelessness unspecified: Secondary | ICD-10-CM | POA: Insufficient documentation

## 2023-03-21 DIAGNOSIS — F319 Bipolar disorder, unspecified: Secondary | ICD-10-CM | POA: Insufficient documentation

## 2023-03-21 DIAGNOSIS — F332 Major depressive disorder, recurrent severe without psychotic features: Secondary | ICD-10-CM

## 2023-03-21 DIAGNOSIS — Z560 Unemployment, unspecified: Secondary | ICD-10-CM | POA: Insufficient documentation

## 2023-03-21 NOTE — Discharge Instructions (Signed)
PT refused AVS

## 2023-03-21 NOTE — ED Provider Notes (Signed)
Behavioral Health Urgent Care Medical Screening Exam  Patient Name: Tamara Guzman MRN: [id number] Date of Evaluation: 03/21/23 Chief Complaint:  I been having these suicidal thoughts that come and go Diagnosis:  Final diagnoses:  Severe episode of recurrent major depressive disorder, without psychotic features    History of Present illness: Tamara Guzman is a 22 y.o. female patient presented to [facility] as a walk unaccompanied with complaints of "I been having the suicidal thoughts that come and go".  Tamara Guzman, 22 y.o., female patient seen face to face by this provider and chart reviewed on 03/21/23.  Per chart review patient has a past psychiatric history of bipolar affective disorder, PTSD, GAD, ADHD, cannabis use disorder and SI.  She reports 3 suicide attempts in the past.  She reports multiple inpatient psychiatric admissions.  She has also been admitted to the [facility].  She is currently unemployed and homeless.  States her mother kicked her out of the home.  She endorses occasional alcohol and marijuana use.  She has in the past had services with [facility].  She currently does not take any medications and has no psychiatric services in place.   On evaluation Tamara Guzman reports she has presented to [facility] three times this past week but did not come in to be assessed.  States that she has had a increase in her depression and anxiety over the past few weeks she identifies being kicked out of her mother's home  and her mother is having health concerns as a Administrator, sports.  She has had passive suicidal thoughts with no intent or plan.  She has had thoughts in the context of, "if it just were to naturally happen I would be okay with it".  She verbally contracts for safety.   During evaluation Tamara Guzman is observed sitting in the assessment area in no acute distress.  She is casually dressed and makes good eye contact.  Her speech is clear, coherent, at a normal  rate and tone.  She is alert/oriented x 4, cooperative, and attentive.  She endorses an increase in her depression anxiety with feelings of helplessness, decreased sleep, decreased appetite, and decreased motivation.  She has a dysthymic affect.  She denies HI/AVH.  She does not appear to be responding to internal/external stimuli.  She does not appear psychotic or manic.  She denies any paranoid or delusional thought content. Patient is able to converse coherently, goal directed thoughts and no distractibility.  Patient answered question appropriately.    Discussed treatment options with patient including inpatient psychiatric admission.  Patient is adamant that she does not want to be admitted into the hospital because she cannot have her cell phone and she does not want to be in a locked facility.  Discussed suicide intervention/prevention planning with patient.  She denies having any access to medications, or firearms/weapons.  She agrees to call [phone], [phone], or present to the nearest emergency room if her suicidal thoughts were to worsen.  She denies for any collateral to be contacted.  Discussed outpatient psychiatric resources including [facility].  Discussed open access walk-in hours.  Patient declined AVS upon discharge.   Flowsheet Row ED from 03/21/2023 in [facility] ED from 11/23/2022 in [facility] Department at [facility] ED from 11/03/2022 in [facility] Emergency Department at [facility]  C-SSRS RISK CATEGORY Moderate Risk No Risk Error: Question 1 not populated       Psychiatric  Specialty Exam  Presentation  General Appearance:Casual  Eye Contact:Fair  Speech:Clear and Coherent; Normal Rate  Speech Volume:Normal  Handedness:Right   Mood and Affect  Mood: Anxious; Depressed  Affect: Congruent   Thought Process  Thought Processes: Coherent  Descriptions of Associations:Intact  Orientation:Full (Time, Place and  Person)  Thought Content:Logical    Hallucinations:None  Ideas of Reference:None  Suicidal Thoughts:Yes, Passive Without Intent; Without Plan; Without Means to Carry Out  Homicidal Thoughts:No   Sensorium  Memory: Immediate Good; Recent Good; Remote Good  Judgment: Good  Insight: Good   Executive Functions  Concentration: Good  Attention Span: Good  Recall: Good  Fund of Knowledge: Good  Language: Good   Psychomotor Activity  Psychomotor Activity: Normal   Assets  Assets: Communication Skills; Desire for Improvement; Physical Health; Resilience; Social Support   Sleep  Sleep: Fair  Number of hours:  5   Physical Exam: Physical Exam Vitals and nursing note reviewed.  Constitutional:      General: She is not in acute distress.    Appearance: She is not ill-appearing.  HENT:     Head: Normocephalic.  Eyes:     General:        Right eye: No discharge.        Left eye: No discharge.     Conjunctiva/sclera: Conjunctivae normal.  Cardiovascular:     Rate and Rhythm: Normal rate.  Pulmonary:     Effort: Pulmonary effort is normal.  Musculoskeletal:        General: Normal range of motion.     Cervical back: Normal range of motion.  Skin:    Coloration: Skin is not jaundiced or pale.  Neurological:     Mental Status: She is alert and oriented to person, place, and time.  Psychiatric:        Attention and Perception: Attention and perception normal.        Mood and Affect: Affect normal. Mood is anxious and depressed.        Speech: Speech normal.        Behavior: Behavior normal. Behavior is cooperative.        Thought Content: Thought content normal.        Cognition and Memory: Cognition normal.        Judgment: Judgment normal.    Review of Systems  Constitutional: Negative.   HENT: Negative.    Eyes: Negative.   Respiratory: Negative.    Cardiovascular: Negative.   Musculoskeletal: Negative.   Skin: Negative.    Neurological: Negative.   Psychiatric/Behavioral:  Positive for depression. The patient is nervous/anxious.    Blood pressure 114/68, pulse 85, temperature 98.5 F (36.9 C), temperature source Oral, resp. rate 18, SpO2 100 %. There is no height or weight on file to calculate BMI.  Musculoskeletal: Strength & Muscle Tone: within normal limits Gait & Station: normal Patient leans: N/A   [facility] MSE Discharge Disposition for Follow up and Recommendations: Based on my evaluation the patient does not appear to have an emergency medical condition and can be discharged with resources and follow up care in outpatient services for Medication Management and Individual Therapy.   Discharge patient  Patient declined inpatient psychiatric admission.  She verbally contracted for safety.  She declined AVS   Ardis Hughs, NP 03/21/2023, 3:58 PM

## 2023-03-21 NOTE — Progress Notes (Signed)
   03/21/23 1514  [facility] Triage Screening (Walk-ins at [facility] only)  What Is the Reason for Your Visit/Call Today? Tamara Guzman is a 22 year old female presenting to [facility] voluntarily with chief complaint of SI for the past two weeks without a plan. Patient reports she has a lot going on in personal life, her mom is sick and "I'm not in the best place in life and I feel hopeless". Patient reports that she has presented to [facility] three times in the past week but always left before checking in. Patient reports history of inpatient treatment since childhood when she was in foster care. Patient has attempted suicide 3x in her life and the last suicide attempt was when she was 22 years old. Patient reports THC use this morning. Patient denies HI, AVH and heavy alcohol use. Patient unable to contract for safety in triage.  How Long Has This Been Causing You Problems? 1-6 months  Have You Recently Had Any Thoughts About Hurting Yourself? Yes  How long ago did you have thoughts about hurting yourself? couple weeks  Are You Planning to Commit Suicide/Harm Yourself At This time? No  Have you Recently Had Thoughts About Hurting Someone [name]? No  Are You Planning To Harm Someone At This Time? No  Are you currently experiencing any auditory, visual or other hallucinations? No  Have You Used Any Alcohol or Drugs in the Past 24 Hours? Yes  How long ago did you use Drugs or Alcohol? this am  What Did You Use and How Much? blunt  Do you have any current medical co-morbidities that require immediate attention? No  Clinician description of patient physical appearance/behavior: tearful  What Do You Feel Would Help You the Most Today? Treatment for Depression or other mood problem  If access to [facility] was not available, would you have sought care in the Emergency Department? No  Determination of Need Urgent (48 hours)  Options For Referral Medication Management;Outpatient Therapy;Inpatient Hospitalization

## 2023-03-22 ENCOUNTER — Other Ambulatory Visit: Payer: Self-pay

## 2023-03-22 ENCOUNTER — Ambulatory Visit (HOSPITAL_COMMUNITY)
Admission: EM | Admit: 2023-03-22 | Discharge: 2023-03-24 | Disposition: A | Discharge status: Home / Self Care | Payer: Medicaid Other | Attending: Psychiatry | Admitting: Psychiatry

## 2023-03-22 DIAGNOSIS — Z1152 Encounter for screening for COVID-19: Secondary | ICD-10-CM | POA: Insufficient documentation

## 2023-03-22 DIAGNOSIS — Z79899 Other long term (current) drug therapy: Secondary | ICD-10-CM | POA: Insufficient documentation

## 2023-03-22 DIAGNOSIS — F3112 Bipolar disorder, current episode manic without psychotic features, moderate: Secondary | ICD-10-CM | POA: Diagnosis present

## 2023-03-22 DIAGNOSIS — R45851 Suicidal ideations: Secondary | ICD-10-CM | POA: Insufficient documentation

## 2023-03-22 DIAGNOSIS — F332 Major depressive disorder, recurrent severe without psychotic features: Secondary | ICD-10-CM | POA: Diagnosis not present

## 2023-03-22 DIAGNOSIS — F1210 Cannabis abuse, uncomplicated: Secondary | ICD-10-CM | POA: Insufficient documentation

## 2023-03-22 DIAGNOSIS — F4310 Post-traumatic stress disorder, unspecified: Secondary | ICD-10-CM | POA: Diagnosis not present

## 2023-03-22 DIAGNOSIS — R4589 Other symptoms and signs involving emotional state: Secondary | ICD-10-CM

## 2023-03-22 DIAGNOSIS — Z9151 Personal history of suicidal behavior: Secondary | ICD-10-CM | POA: Insufficient documentation

## 2023-03-22 LAB — POC SARS CORONAVIRUS 2 AG: SARSCOV2ONAVIRUS 2 AG: NEGATIVE

## 2023-03-22 MED ORDER — LORAZEPAM 1 MG PO TABS
1.0000 mg | ORAL_TABLET | ORAL | Status: AC | PRN
  Administered 2023-03-22: 1 mg via ORAL
  Filled 2023-03-22: qty 1

## 2023-03-22 MED ORDER — OLANZAPINE 5 MG PO TBDP: 5.0000 mg | ORAL_TABLET | Freq: Three times a day (TID) | ORAL | Status: DC | PRN

## 2023-03-22 MED ORDER — MAGNESIUM HYDROXIDE 400 MG/5ML PO SUSP: 30.0000 mL | Freq: Every day | ORAL | Status: DC | PRN

## 2023-03-22 MED ORDER — ACETAMINOPHEN 325 MG PO TABS: 650.0000 mg | ORAL_TABLET | Freq: Four times a day (QID) | ORAL | Status: DC | PRN

## 2023-03-22 MED ORDER — ZIPRASIDONE MESYLATE 20 MG IM SOLR: 20.0000 mg | INTRAMUSCULAR | Status: DC | PRN

## 2023-03-22 MED ORDER — ALUM & MAG HYDROXIDE-SIMETH 200-200-20 MG/5ML PO SUSP: 30.0000 mL | ORAL | Status: DC | PRN

## 2023-03-22 MED ORDER — TRAZODONE HCL 100 MG PO TABS
100.0000 mg | ORAL_TABLET | Freq: Every evening | ORAL | Status: DC | PRN
  Administered 2023-03-22 – 2023-03-23 (×2): 100 mg via ORAL
  Filled 2023-03-22 (×2): qty 1

## 2023-03-22 NOTE — ED Notes (Signed)
Pt A&O x 4, presents with suicidal ideations, plan to walk in front of car. Complaint of increasing SI thoughts over the past few days.  Denies HI or AVH.  Comfort measures given.  Monitoring for safety.

## 2023-03-22 NOTE — Progress Notes (Signed)
   03/22/23 2142  [facility] Triage Screening (Walk-ins at [facility] only)  How Did You Hear About [place]? Self  What Is the Reason for Your Visit/Call Today? Pt presents to [facility] voluntarily, unaccompanied with complaint of suicidal thoughts with a plan to walk in front of a car. Pt reports she has a lot going on in personal life, her mom is sick and "I'm not in the best place in life and I feel hopeless". Pt reports that she has presented to [facility] three times in the past week but always left before checking in. Pt reports suicidal thoughts as ongoing. Pt has history of depression, anxiety and bipolar. Pt is prescribed medications at this time. Pt denies HI, AVH.  How Long Has This Been Causing You Problems? 1-6 months  Have You Recently Had Any Thoughts About Hurting Yourself? Yes  How long ago did you have thoughts about hurting yourself? currently  Are You Planning to Commit Suicide/Harm Yourself At This time? Yes  Have you Recently Had Thoughts About Hurting Someone [name]? No  Are You Planning To Harm Someone At This Time? No  Are you currently experiencing any auditory, visual or other hallucinations? No  Have You Used Any Alcohol or Drugs in the Past 24 Hours? Yes  How long ago did you use Drugs or Alcohol? about 30 minutes ago  What Did You Use and How Much? marijuana (1gm)  Do you have any current medical co-morbidities that require immediate attention? No  Clinician description of patient physical appearance/behavior: cooperative, calm and oriented  What Do You Feel Would Help You the Most Today? Treatment for Depression or other mood problem  If access to [facility] was not available, would you have sought care in the Emergency Department? No  Determination of Need Urgent (48 hours)  Options For Referral Other: Comment;BH Urgent Care;Outpatient Therapy;Medication Management;Inpatient Hospitalization

## 2023-03-22 NOTE — ED Provider Notes (Signed)
[facility] Continuous Assessment Admission H&P  Date: 03/22/23 Patient Name: Tamara Guzman MRN: [id number] Chief Complaint: increase SI  Diagnoses:  Final diagnoses:  Suicidal ideation  Marijuana abuse  Anxious appearance    HPI: Tamara Guzman, 22 y/o female with a history of chronic suicide ideation, depression, general anxiety, bipolar disorder, and PTSD.  Presented to [facility] voluntarily patient she is suicidal with plans to kill herself by walking out into traffic.  Patient was seen yesterday and refused inpatient admission.  According to patient she lives with her mother and her stepfather however she stated she is having issues with them and this is causing increased stress.  Patient is unemployed, not in school.  Patient does have multiple of hospitalization.  Patient endorsed marijuana use stating she smoked marijuana on a daily basis  Face-to-face observation of patient, patient is alert and oriented x 4, speech is clear, maintaining minimal eye contact.  Patient appearance is casual patient can be agitated at times when asked questions.  Patient does seem to respond inappropriately sometimes to question when asked she started laughing.  Patient does seem to be nonchalant about things.  Patient endorsed suicidal ideation stating she plan to walk out into traffic.  Denies HI, AVH, or paranoia.  Patient denies access to guns or any weapons at this time.  Patient reports good sleep at night.  Recommend inpatient observation  Total Time spent with patient: 30 minutes  Musculoskeletal  Strength & Muscle Tone: within normal limits Gait & Station: normal Patient leans: N/A  Psychiatric Specialty Exam  Presentation General Appearance:  Casual  Eye Contact: Fair  Speech: Clear and Coherent  Speech Volume: Normal  Handedness: Right   Mood and Affect  Mood: Anxious; Depressed; Worthless  Affect: Solicitor  Processes: Coherent  Descriptions of Associations:Circumstantial  Orientation:Full (Time, Place and Person)  Thought Content:WDL    Hallucinations:Hallucinations: None  Ideas of Reference:None  Suicidal Thoughts:Suicidal Thoughts: Yes, Active SI Active Intent and/or Plan: With Intent; With Plan SI Passive Intent and/or Plan: With Intent; With Plan  Homicidal Thoughts:Homicidal Thoughts: No   Sensorium  Memory: Immediate Fair  Judgment: Poor  Insight: Fair   Chartered certified accountant: Fair  Attention Span: Good  Recall: Good  Fund of Knowledge: Good  Language: Good   Psychomotor Activity  Psychomotor Activity: Psychomotor Activity: Normal   Assets  Assets: Desire for Improvement; Talents/Skills   Sleep  Sleep: Sleep: Fair Number of Hours of Sleep: 6   Nutritional Assessment (For OBS and [facility]BC admissions only) Has the patient had a weight loss or gain of 10 pounds or more in the last 3 months?: No Has the patient had a decrease in food intake/or appetite?: No Does the patient have dental problems?: No Does the patient have eating habits or behaviors that may be indicators of an eating disorder including binging or inducing vomiting?: No Has the patient recently lost weight without trying?: 0 Has the patient been eating poorly because of a decreased appetite?: 0 Malnutrition Screening Tool Score: 0    Physical Exam HENT:     Head: Normocephalic.     Nose: Nose normal.  Cardiovascular:     Rate and Rhythm: Normal rate.  Pulmonary:     Effort: Pulmonary effort is normal.  Musculoskeletal:        General: Normal range of motion.     Cervical back: Normal range of motion.  Neurological:     General: No  focal deficit present.     Mental Status: She is alert.  Psychiatric:        Mood and Affect: Mood normal.        Behavior: Behavior normal.        Thought Content: Thought content normal.        Judgment: Judgment normal.     Review of Systems  Constitutional: Negative.   HENT: Negative.    Eyes: Negative.   Respiratory: Negative.    Cardiovascular: Negative.   Gastrointestinal: Negative.   Genitourinary: Negative.   Musculoskeletal: Negative.   Skin: Negative.   Neurological: Negative.   Psychiatric/Behavioral:  Positive for depression, substance abuse and suicidal ideas. The patient is nervous/anxious.     Blood pressure 109/70, pulse 95, temperature 98.8 F (37.1 C), temperature source Oral, resp. rate 18, SpO2 100 %. There is no height or weight on file to calculate BMI.  Past Psychiatric History: PTSD, marijuana abuse, bipolar disorder, suicidal ideation  Is the patient at risk to self? Yes  Has the patient been a risk to self in the past 6 months? Yes .    Has the patient been a risk to self within the distant past? Yes   Is the patient a risk to others? No   Has the patient been a risk to others in the past 6 months? No   Has the patient been a risk to others within the distant past? No   Past Medical History: See chart  Family History: Unknown  Social History: Marijuana use  Last Labs:  Admission on 11/23/2022, Discharged on 11/23/2022  Component Date Value Ref Range Status   SARS Coronavirus 2 by RT PCR 11/23/2022 NEGATIVE  NEGATIVE Final   Comment: (NOTE) SARS-CoV-2 target nucleic acids are NOT DETECTED.  The SARS-CoV-2 RNA is generally detectable in upper respiratory specimens during the acute phase of infection. The lowest concentration of SARS-CoV-2 viral copies this assay can detect is 138 copies/mL. A negative result does not preclude SARS-Cov-2 infection and should not be used as the sole basis for treatment or other patient management decisions. A negative result may occur with  improper specimen collection/handling, submission of specimen other than nasopharyngeal swab, presence of viral mutation(s) within the areas targeted by this assay, and inadequate number of  viral copies(<138 copies/mL). A negative result must be combined with clinical observations, patient history, and epidemiological information. The expected result is Negative.  Fact Sheet for Patients:  BloggerCourse.com  Fact Sheet for Healthcare Providers:  SeriousBroker.it  This test is no                          t yet approved or cleared by the [place] FDA and  has been authorized for detection and/or diagnosis of SARS-CoV-2 by FDA under an Emergency Use Authorization (EUA). This EUA will remain  in effect (meaning this test can be used) for the duration of the COVID-19 declaration under Section 564(b)(1) of the Act, 21 U.S.C.section 360bbb-3(b)(1), unless the authorization is terminated  or revoked sooner.       Influenza A by PCR 11/23/2022 NEGATIVE  NEGATIVE Final   Influenza B by PCR 11/23/2022 NEGATIVE  NEGATIVE Final   Comment: (NOTE) The Xpert Xpress SARS-CoV-2/FLU/RSV plus assay is intended as an aid in the diagnosis of influenza from Nasopharyngeal swab specimens and should not be used as a sole basis for treatment. Nasal washings and aspirates are unacceptable for Xpert Xpress SARS-CoV-2/FLU/RSV testing.  Fact Sheet for Patients: BloggerCourse.com  Fact Sheet for Healthcare Providers: SeriousBroker.it  This test is not yet approved or cleared by the [place] FDA and has been authorized for detection and/or diagnosis of SARS-CoV-2 by FDA under an Emergency Use Authorization (EUA). This EUA will remain in effect (meaning this test can be used) for the duration of the COVID-19 declaration under Section 564(b)(1) of the Act, 21 U.S.C. section 360bbb-3(b)(1), unless the authorization is terminated or revoked.     Resp Syncytial Virus by PCR 11/23/2022 NEGATIVE  NEGATIVE Final   Comment: (NOTE) Fact Sheet for  Patients: BloggerCourse.com  Fact Sheet for Healthcare Providers: SeriousBroker.it  This test is not yet approved or cleared by the [place] FDA and has been authorized for detection and/or diagnosis of SARS-CoV-2 by FDA under an Emergency Use Authorization (EUA). This EUA will remain in effect (meaning this test can be used) for the duration of the COVID-19 declaration under Section 564(b)(1) of the Act, 21 U.S.C. section 360bbb-3(b)(1), unless the authorization is terminated or revoked.  Performed at [facility], [address] [place], [place] [address]    Group A Strep by PCR 11/23/2022 NOT DETECTED  NOT DETECTED Final   Performed at [facility], [address] [place], [place] [address]  Admission on 09/27/2022, Discharged on 09/27/2022  Component Date Value Ref Range Status   Glucose, UA 09/27/2022 NEGATIVE  NEGATIVE mg/dL Final   Bilirubin Urine 09/27/2022 NEGATIVE  NEGATIVE Final   Ketones, ur 09/27/2022 NEGATIVE  NEGATIVE mg/dL Final   Specific Gravity, Urine 09/27/2022 1.020  1.005 - 1.030 Final   Hgb urine dipstick 09/27/2022 NEGATIVE  NEGATIVE Final   pH 09/27/2022 7.5  5.0 - 8.0 Final   Protein, ur 09/27/2022 NEGATIVE  NEGATIVE mg/dL Final   Urobilinogen, UA 09/27/2022 1.0  0.0 - 1.0 mg/dL Final   Nitrite 09/81/1914 NEGATIVE  NEGATIVE Final   Leukocytes,Ua 09/27/2022 NEGATIVE  NEGATIVE Final   Biochemical Testing Only. Please order routine urinalysis from main lab if confirmatory testing is needed.   Preg Test, Ur 09/27/2022 NEGATIVE  NEGATIVE Final   Comment:        THE SENSITIVITY OF THIS METHODOLOGY IS >24 mIU/mL    HIV Screen 4th Generation wRfx 09/27/2022 Non Reactive  Non Reactive Final   Performed at [facility], [address] [place], [place] [address]   RPR Ser Ql 09/27/2022 NON REACTIVE  NON REACTIVE Final   Performed at [facility], [address], [place], [place]  [address]   HCV Ab 09/27/2022 NON REACTIVE  NON REACTIVE Final   Comment: (NOTE) Nonreactive HCV antibody screen is consistent with no HCV infections,  unless recent infection is suspected or other evidence exists to indicate HCV infection.  Performed at [facility], [address], [place], [place] [address]    Bacterial Vaginitis (gardnerella) 09/27/2022 Positive (A)   Final   Candida Vaginitis 09/27/2022 Positive (A)   Final   Candida Glabrata 09/27/2022 Negative   Final   Trichomonas 09/27/2022 Positive (A)   Final   Chlamydia 09/27/2022 Negative   Final   Neisseria Gonorrhea 09/27/2022 Negative   Final   Comment 09/27/2022 Normal Reference Range Bacterial Vaginosis - Negative   Final   Comment 09/27/2022 Normal Reference Range Candida Species - Negative   Final   Comment 09/27/2022 Normal Reference Ranger Chlamydia - Negative   Final   Comment 09/27/2022 Normal Reference Range Candida [place] -  Negative   Final   Comment 09/27/2022 Normal Reference Range Trichomonas - Negative   Final   Comment 09/27/2022 Normal Reference Range Neisseria Gonorrhea - Negative   Final    Allergies: Other, Chocolate, and Peanut-containing drug products  Medications:   Medical Decision Making  Inpatient observation    Recommendations  Based on my evaluation the patient appears to have an emergency medical condition for which I recommend the patient be transferred to the emergency department for further evaluation.  Sindy Guadeloupeoy Pamelia Botto, NP 03/22/23  10:22 PM
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
## 2023-03-23 ENCOUNTER — Encounter (HOSPITAL_COMMUNITY): Payer: Self-pay | Admitting: Registered Nurse

## 2023-03-23 LAB — SARS CORONAVIRUS 2 BY RT PCR: SARS Coronavirus 2 by RT PCR: NEGATIVE

## 2023-03-23 MED ORDER — FLUOXETINE HCL 20 MG PO CAPS
20.0000 mg | ORAL_CAPSULE | Freq: Every day | ORAL | Status: DC
  Administered 2023-03-23 – 2023-03-24 (×2): 20 mg via ORAL
  Filled 2023-03-23 (×2): qty 1

## 2023-03-23 NOTE — ED Notes (Signed)
Pt calm and cooperative. Alert and orient x 4.No c/o pain or distress. Will continue to montior for safety

## 2023-03-23 NOTE — ED Notes (Signed)
Pt did not answer when asked,"would they like to have some lunch"?

## 2023-03-23 NOTE — Discharge Instructions (Addendum)
Base on the information you have provided and the presenting issue, outpatient services and resources for have been recommended.  It is imperative that you follow through with treatment recommendations within 5-7 days from the of discharge to mitigate further risk to your safety and mental well-being. A list of referrals has been provided below to get you started.  You are not limited to the list provided.  In case of an urgent crisis, you may contact the Mobile Crisis Unit with [facility] at [phone].    [facility] [address], [place], [address] [phone] phone   New Patient Assessment/Therapy Walk-Ins:  Monday and Wednesday: 8 am until slots are full. Every 1st and 2nd Fridays of the month: 1 pm - 5 pm.  NO ASSESSMENT/THERAPY WALK-INS ON TUESDAYS OR THURSDAYS  New Patient Assessment/Medication Management Walk-Ins:  Monday - Friday:  8 am - 11 am.  For all walk-ins, we ask that you arrive by 7:30 am because patients will be seen in the order of arrival.  Availability is limited; therefore, you may not be seen on the same day that you walk-in.  Our goal is to serve and meet the needs of our community to the best of our ability.

## 2023-03-23 NOTE — ED Provider Notes (Signed)
Behavioral Health Progress Note  Date and Time: 03/23/2023 9:35 AM Name: Tamara Guzman MRN:  [id number]  Subjective:  Tamara Guzman, 22 y/o female with a history of chronic suicide ideation, depression, general anxiety, bipolar disorder, and PTSD admitted to continuous assessment unit after presenting to [facility] voluntarily as a walk in with complaints of suicidal with plans to kill herself by walking out into traffic.  She was seen the day before but refused psychiatric hospitalization.       Tamara Guzman seen face to face by this provider, consulted with Dr. Nelly Rout; and chart reviewed on 03/23/23.  On evaluation Magin Balbi reports she continues to be suicidal and unable to contract for safety.  "I came in because I don't want to do anything stupid.  I don't want nothing to happen."  Referring to trying to kill herself.  She denies homicidal ideation, psychosis, and paranoia.    During evaluation Tamara Guzman is lying in bed with covers pulled up to her chin.  She was asked to sit up so that she could participate in assessment but only pulled cover below her chin.  She states "You just asking the same questions that I've answered before.  I feel the same."  There is no noted distress.  She is alert/oriented x 4, calm, but irritable with being woken up for assessment.  Her responses were appropriate to assessment questions.  Her mood is depressed and irritable with congruent affect.  She spoke in a clear tone at moderate volume, and normal pace, with minimal eye contact.   She denies homicidal ideation, psychosis, and paranoia but continues to endorse suicidal ideation with plan, intent, and means.  Objectively:  there is no evidence of psychosis/mania or delusional thinking.  She conversed coherently, with goal directed thoughts, and no distractibility, or pre-occupation.  Will continue to recommend inpatient psychiatric.     Diagnosis:  Final diagnoses:   Suicidal ideation  Marijuana abuse  Anxious appearance  MDD (major depressive disorder), recurrent severe, without psychosis    Total Time spent with patient: 20 minutes  Past Psychiatric History:  PTSD, marijuana abuse, bipolar disorder, suicidal ideation  Past Medical History:  Past Medical History:  Diagnosis Date   ADHD    Anxiety    Bipolar 1 disorder    Depression    Obesity     Family History:  Family History  Problem Relation Age of Onset   Hypertension Mother    Diabetes Mother    Healthy Father     Family Psychiatric  History: None reported Social History:  reports that she has never smoked. She has never used smokeless tobacco. She reports that she does not currently use alcohol. She reports current drug use. Drug: Marijuana.   Additional Social History:    Pain Medications: See MAR Prescriptions: See MAR Over the Counter: See MAR History of alcohol / drug use?: Yes Longest period of sobriety (when/how long): Unsure. Negative Consequences of Use: Personal relationships Withdrawal Symptoms: None Name of Substance 1: Marijuana. 1 - Age of First Use: Unsure,, 1 - Amount (size/oz): Pt reports, smoking a gram of Marijuana. 1 - Frequency: Pt reports, smoking a couple times per day. 1 - Duration: Ongoing. 1 - Last Use / Amount: 03/22/2023. 1 - Method of Aquiring: Purchase. 1- Route of Use: Smoke.     Sleep: Good  reports slept well last night  Appetite:  Good  Current Medications:  Current Facility-Administered Medications  Medication  Dose Route Frequency Provider Last Rate Last Admin   acetaminophen (TYLENOL) tablet 650 mg  650 mg Oral Q6H PRN Sindy GuadeloupeWilliams, Roy, NP       alum & mag hydroxide-simeth (MAALOX/MYLANTA) 200-200-20 MG/5ML suspension 30 mL  30 mL Oral Q4H PRN Sindy GuadeloupeWilliams, Roy, NP       magnesium hydroxide (MILK OF MAGNESIA) suspension 30 mL  30 mL Oral Daily PRN Sindy GuadeloupeWilliams, Roy, NP       OLANZapine zydis (ZYPREXA) disintegrating tablet 5 mg  5 mg Oral  Q8H PRN Sindy GuadeloupeWilliams, Roy, NP       And   ziprasidone (GEODON) injection 20 mg  20 mg Intramuscular PRN Sindy GuadeloupeWilliams, Roy, NP       traZODone (DESYREL) tablet 100 mg  100 mg Oral QHS PRN Sindy GuadeloupeWilliams, Roy, NP   100 mg at 03/22/23 2340   No current outpatient medications on file.    Labs  Lab Results:  Admission on 03/22/2023  Component Date Value Ref Range Status   SARS Coronavirus 2 by RT PCR 03/22/2023 NEGATIVE  NEGATIVE Final   Performed at [facility], [address] [place], [place] [address]   SARSCOV2ONAVIRUS 2 AG 03/22/2023 NEGATIVE  NEGATIVE Final   Comment: (NOTE) SARS-CoV-2 antigen NOT DETECTED.   Negative results are presumptive.  Negative results do not preclude SARS-CoV-2 infection and should not be used as the sole basis for treatment or other patient management decisions, including infection  control decisions, particularly in the presence of clinical signs and  symptoms consistent with COVID-19, or in those who have been in contact with the virus.  Negative results must be combined with clinical observations, patient history, and epidemiological information. The expected result is Negative.  Fact Sheet for Patients: https://www.jennings-kim.com/https://www.fda.gov/media/141569/download  Fact Sheet for Healthcare Providers: https://alexander-rogers.biz/https://www.fda.gov/media/141568/download  This test is not yet approved or cleared by the [place] FDA and  has been authorized for detection and/or diagnosis of SARS-CoV-2 by FDA under an Emergency Use Authorization (EUA).  This EUA will remain in effect (meaning this test can be used) for the duration of  the COV                          ID-19 declaration under Section 564(b)(1) of the Act, 21 U.S.C. section 360bbb-3(b)(1), unless the authorization is terminated or revoked sooner.    Admission on 11/23/2022, Discharged on 11/23/2022  Component Date Value Ref Range Status   SARS Coronavirus 2 by RT PCR 11/23/2022 NEGATIVE  NEGATIVE Final   Comment:  (NOTE) SARS-CoV-2 target nucleic acids are NOT DETECTED.  The SARS-CoV-2 RNA is generally detectable in upper respiratory specimens during the acute phase of infection. The lowest concentration of SARS-CoV-2 viral copies this assay can detect is 138 copies/mL. A negative result does not preclude SARS-Cov-2 infection and should not be used as the sole basis for treatment or other patient management decisions. A negative result may occur with  improper specimen collection/handling, submission of specimen other than nasopharyngeal swab, presence of viral mutation(s) within the areas targeted by this assay, and inadequate number of viral copies(<138 copies/mL). A negative result must be combined with clinical observations, patient history, and epidemiological information. The expected result is Negative.  Fact Sheet for Patients:  BloggerCourse.comhttps://www.fda.gov/media/152166/download  Fact Sheet for Healthcare Providers:  SeriousBroker.ithttps://www.fda.gov/media/152162/download  This test is no  t yet approved or cleared by the [place] and  has been authorized for detection and/or diagnosis of SARS-CoV-2 by FDA under an Emergency Use Authorization (EUA). This EUA will remain  in effect (meaning this test can be used) for the duration of the COVID-19 declaration under Section 564(b)(1) of the Act, 21 U.S.C.section 360bbb-3(b)(1), unless the authorization is terminated  or revoked sooner.       Influenza A by PCR 11/23/2022 NEGATIVE  NEGATIVE Final   Influenza B by PCR 11/23/2022 NEGATIVE  NEGATIVE Final   Comment: (NOTE) The Xpert Xpress SARS-CoV-2/FLU/RSV plus assay is intended as an aid in the diagnosis of influenza from Nasopharyngeal swab specimens and should not be used as a sole basis for treatment. Nasal washings and aspirates are unacceptable for Xpert Xpress SARS-CoV-2/FLU/RSV testing.  Fact Sheet for Patients: BloggerCourse.com  Fact  Sheet for Healthcare Providers: SeriousBroker.it  This test is not yet approved or cleared by the [place] FDA and has been authorized for detection and/or diagnosis of SARS-CoV-2 by FDA under an Emergency Use Authorization (EUA). This EUA will remain in effect (meaning this test can be used) for the duration of the COVID-19 declaration under Section 564(b)(1) of the Act, 21 U.S.C. section 360bbb-3(b)(1), unless the authorization is terminated or revoked.     Resp Syncytial Virus by PCR 11/23/2022 NEGATIVE  NEGATIVE Final   Comment: (NOTE) Fact Sheet for Patients: BloggerCourse.com  Fact Sheet for Healthcare Providers: SeriousBroker.it  This test is not yet approved or cleared by the [place] FDA and has been authorized for detection and/or diagnosis of SARS-CoV-2 by FDA under an Emergency Use Authorization (EUA). This EUA will remain in effect (meaning this test can be used) for the duration of the COVID-19 declaration under Section 564(b)(1) of the Act, 21 U.S.C. section 360bbb-3(b)(1), unless the authorization is terminated or revoked.  Performed at [facility], [address], [place], [place] [address]    Group A Strep by PCR 11/23/2022 NOT DETECTED  NOT DETECTED Final   Performed at [facility], [address] [place], [place] [address]  Admission on 09/27/2022, Discharged on 09/27/2022  Component Date Value Ref Range Status   Glucose, UA 09/27/2022 NEGATIVE  NEGATIVE mg/dL Final   Bilirubin Urine 09/27/2022 NEGATIVE  NEGATIVE Final   Ketones, ur 09/27/2022 NEGATIVE  NEGATIVE mg/dL Final   Specific Gravity, Urine 09/27/2022 1.020  1.005 - 1.030 Final   Hgb urine dipstick 09/27/2022 NEGATIVE  NEGATIVE Final   pH 09/27/2022 7.5  5.0 - 8.0 Final   Protein, ur 09/27/2022 NEGATIVE  NEGATIVE mg/dL Final   Urobilinogen, UA 09/27/2022 1.0  0.0 - 1.0 mg/dL Final   Nitrite  51/70/0174 NEGATIVE  NEGATIVE Final   Leukocytes,Ua 09/27/2022 NEGATIVE  NEGATIVE Final   Biochemical Testing Only. Please order routine urinalysis from main lab if confirmatory testing is needed.   Preg Test, Ur 09/27/2022 NEGATIVE  NEGATIVE Final   Comment:        THE SENSITIVITY OF THIS METHODOLOGY IS >24 mIU/mL    HIV Screen 4th Generation wRfx 09/27/2022 Non Reactive  Non Reactive Final   Performed at [facility], [address] [place], [place] [address]   RPR Ser Ql 09/27/2022 NON REACTIVE  NON REACTIVE Final   Performed at [facility], [address] [place], [place] [address]   HCV Ab 09/27/2022 NON REACTIVE  NON REACTIVE Final   Comment: (NOTE) Nonreactive HCV antibody screen is consistent  with no HCV infections,  unless recent infection is suspected or other evidence exists to indicate HCV infection.  Performed at [facility], [address] [place], [place] [address]    Bacterial Vaginitis (gardnerella) 09/27/2022 Positive (A)   Final   Candida Vaginitis 09/27/2022 Positive (A)   Final   Candida Glabrata 09/27/2022 Negative   Final   Trichomonas 09/27/2022 Positive (A)   Final   Chlamydia 09/27/2022 Negative   Final   Neisseria Gonorrhea 09/27/2022 Negative   Final   Comment 09/27/2022 Normal Reference Range Bacterial Vaginosis - Negative   Final   Comment 09/27/2022 Normal Reference Range Candida Species - Negative   Final   Comment 09/27/2022 Normal Reference Ranger Chlamydia - Negative   Final   Comment 09/27/2022 Normal Reference Range Candida Galbrata - Negative   Final   Comment 09/27/2022 Normal Reference Range Trichomonas - Negative   Final   Comment 09/27/2022 Normal Reference Range Neisseria Gonorrhea - Negative   Final    Blood Alcohol level:  Lab Results  Component Value Date   ETH <10 04/05/2020   ETH <10 05/03/2018    Metabolic Disorder Labs: Lab Results  Component Value Date   HGBA1C 4.8 06/27/2022   MPG 91.06 06/27/2022    MPG 116.89 04/05/2020   Lab Results  Component Value Date   PROLACTIN 1.5 (L) 07/09/2018   Lab Results  Component Value Date   CHOL 142 06/27/2022   TRIG 65 06/27/2022   HDL 50 06/27/2022   CHOLHDL 2.8 06/27/2022   VLDL 13 06/27/2022   LDLCALC 79 06/27/2022   LDLCALC 97 07/09/2018    Therapeutic Lab Levels: Lab Results  Component Value Date   LITHIUM 0.57 (L) 07/01/2022   No results found for: "VALPROATE" No results found for: "CBMZ"  Physical Findings   AIMS    Flowsheet Row [facility] Admission (Discharged) from OP [facility]  AIMS Total Score 0 0      AUDIT    Flowsheet Row Ad[facility] (Discharged[facility]  Alcohol Use Disorder Identification Test Final Score (AUDIT) 2      PHQ2-9    Flowsheet Row ED from 07/21/2022 in [facility] ED from 06/27/2022 in [facility]  PHQ-2 Total Score 3 0  PHQ-9 Total Score 6 1      Flowsheet Row ED from 03/22/2023 in [facility] ED from 03/21/2023 in [facility] ED from 11/23/2022 in [facility] Emergency Department at [facility]  C-SSRS RISK CATEGORY High Risk Moderate Risk No Risk        Musculoskeletal  Strength & Muscle Tone: within normal limits Gait & Station: normal Patient leans: N/A  Psychiatric Specialty Exam  Presentation  General Appearance:  Appropriate for Environment  Eye Contact: Minimal  Speech: Clear and Coherent; Normal Rate  Speech Volume: Normal  Handedness: Right   Mood and Affect  Mood: Depressed; Irritable  Affect: Congruent   Thought Process  Thought Processes: Coherent; Linear  Descriptions of Associations:Intact  Orientation:Full (Time, Place and  Person)  Thought Content:Logical  Diagnosis of Schizophrenia or Schizoaffective disorder in past: No    Hallucinations:Hallucinations: None  Ideas of Reference:None  Suicidal Thoughts:Suicidal Thoughts: Yes, Passive SI Active Intent and/or Plan: With Intent; With Plan; With Means to Carry Out SI  Passive Intent and/or Plan: With Intent; With Plan  Homicidal Thoughts:Homicidal Thoughts: No   Sensorium  Memory: Immediate Good; Recent Good  Judgment: Fair  Insight: Fair   Art therapist  Concentration: Good  Attention Span: Good  Recall: Good  Fund of Knowledge: Good  Language: Good   Psychomotor Activity  Psychomotor Activity: Psychomotor Activity: Normal   Assets  Assets: Desire for Improvement; Housing; Social Support; Physical Health   Sleep  Sleep: Sleep: Fair Number of Hours of Sleep: 6   Nutritional Assessment (For OBS and FBC admissions only) Has the patient had a weight loss or gain of 10 pounds or more in the last 3 months?: No Has the patient had a decrease in food intake/or appetite?: No Does the patient have dental problems?: No Does the patient have eating habits or behaviors that may be indicators of an eating disorder including binging or inducing vomiting?: No Has the patient recently lost weight without trying?: 0 Has the patient been eating poorly because of a decreased appetite?: 0 Malnutrition Screening Tool Score: 0   Physical Exam  Physical Exam Vitals and nursing note reviewed. Exam conducted with a chaperone present.  Constitutional:      General: She is not in acute distress.    Appearance: Normal appearance. She is not ill-appearing.  HENT:     Head: Normocephalic.  Eyes:     Conjunctiva/sclera: Conjunctivae normal.  Cardiovascular:     Rate and Rhythm: Normal rate.  Pulmonary:     Effort: Pulmonary effort is normal. No respiratory distress.  Musculoskeletal:        General: Normal range of motion.      Cervical back: Normal range of motion.  Skin:    General: Skin is warm and dry.  Neurological:     Mental Status: She is alert and oriented to person, place, and time.  Psychiatric:        Attention and Perception: Attention and perception normal. She does not perceive auditory or visual hallucinations.        Mood and Affect: Mood is depressed.        Speech: Speech normal.        Behavior: Behavior normal.        Thought Content: Thought content is not paranoid or delusional. Thought content includes suicidal ideation. Thought content does not include homicidal ideation. Thought content includes suicidal plan.        Cognition and Memory: Cognition normal.    Review of Systems  Constitutional:        No reported complaints  Psychiatric/Behavioral:  Positive for depression, substance abuse (marijuana on a daily basis.) and suicidal ideas. Negative for hallucinations. The patient is nervous/anxious and has insomnia.   All other systems reviewed and are negative.  Blood pressure (!) 93/51, pulse 91, temperature 98.7 F (37.1 C), temperature source Oral, resp. rate 18, SpO2 99 %. There is no height or weight on file to calculate BMI.  Treatment Plan Summary: Daily contact with patient to assess and evaluate symptoms and progress in treatment, Medication management, and Plan Recommend inpatient psychiatric treatment  Stephanos Fan, NP 03/23/2023 9:35 AM

## 2023-03-23 NOTE — BH Assessment (Addendum)
Disposition:   Received a call from "Morrie Sheldon" with [facility]. She indicated that Tamara Guzman has been accepted to their facility for inpatient psychiatric treatment. The bed is ready now. The accepting provider is Meyer Russel, NP/Victor Derek Mound, MD. Patient to present to the [facility] upon arrival (Address: [address], [place], [place]). Nurse report [phone].   Patient's nurse Lattie Corns, NP) and [facility] provider [facility] Par Rankin, NP) provided disposition updates.

## 2023-03-23 NOTE — Progress Notes (Signed)
Pt is asleep. Respirations are even and unlabored. No distress noted. Staff will monitor for pt's safety. 

## 2023-03-23 NOTE — Progress Notes (Signed)
Pt is awake, alert and oriented X4. Pt did not voice any complaints of pain or discomfort. No distress noted. Pt denies current SI/HI/AVH, plan or intent. Pt verbally contracts for safety on the unit. Staff will monitor for pt's safety.

## 2023-03-23 NOTE — ED Notes (Signed)
Pt was given chips and dinner.

## 2023-03-23 NOTE — ED Notes (Signed)
Provided supplies to take a shower with. Patient is requesting to leave. Provider has been notified via secure chat.

## 2023-03-23 NOTE — Progress Notes (Signed)
LCSW Progress Note  [id number]   Tamara Guzman  03/23/2023  3:52 PM  Description:   Inpatient Psychiatric Referral  Patient was recommended inpatient per Assunta Found, NP. There are no available beds at [facility]. Patient was referred to the following facilities:   Destination  Service Provider Address Phone Fax  [facility]  [address] [place] [address] [phone]  [facility] [address]  [address] [place] [address] [phone]  [facility]  [address] [place], [place] [address] [phone]  [facility] [address] [place]  [address], [place] [address] [phone]  [facility]  [address] [place], [place] [address] [phone]  [facility]  [address] [place] [address] [phone]  [facility]  [address] [place] [address] [phone]  [facility]  [address] [place] [address] [phone]  [facility]  [address] [place], [place] [address] [phone]  [facility]  [address] [place] [address] [phone]  [facility]  [address], [place] [address] [phone]  [facility]  [address], [place] [address] [phone]  [facility]  [address], [place] [address] [phone]  [facility]  [address], [place] [address] [phone]  [facility]  [address], [place] [address] [phone]  [facility]  [address] [place] [address]  [phone]  [facility]  [address], [place] [address] [phone]  [facility]  [address], [place] [address] [phone]  [facility]  [address], [place] [phone]  [facility]  [address] [place] [address] [phone]  [facility]  [address] [place] [address] [phone]  [facility]  [address], [place] [address] [phone]  [facility]  [address] [place] [address] [phone]  [facility]  [address] [place] [address] [phone]     Situation ongoing, CSW to continue following and update chart as more information becomes available.      Cathie Beams, [place]  03/23/2023 3:52 PM

## 2023-03-23 NOTE — ED Notes (Signed)
Pt sleeping@this time. Breathing even and unlabored. Will continue to monitor for safety 

## 2023-03-24 LAB — COMPREHENSIVE METABOLIC PANEL
ALT: 9 U/L (ref 0–44)
AST: 20 U/L (ref 15–41)
Albumin: 2.9 g/dL — ABNORMAL LOW (ref 3.5–5.0)
Alkaline Phosphatase: 57 U/L (ref 38–126)
Anion gap: 8 (ref 5–15)
BUN: 8 mg/dL (ref 6–20)
CO2: 23 mmol/L (ref 22–32)
Calcium: 8.6 mg/dL — ABNORMAL LOW (ref 8.9–10.3)
Chloride: 106 mmol/L (ref 98–111)
Creatinine, Ser: 0.91 mg/dL (ref 0.44–1.00)
GFR, Estimated: >60 mL/min (ref >60)
Glucose, Bld: 100 mg/dL — ABNORMAL HIGH (ref 70–99)
Potassium: 4.2 mmol/L (ref 3.5–5.1)
Sodium: 137 mmol/L (ref 135–145)
Total Bilirubin: 0.4 mg/dL (ref 0.3–1.2)
Total Protein: 5.4 g/dL — ABNORMAL LOW (ref 6.5–8.1)

## 2023-03-24 LAB — CBC WITH DIFFERENTIAL/PLATELET
Abs Immature Granulocytes: 0.01 10*3/uL (ref 0.00–0.07)
Basophils Absolute: 0 10*3/uL (ref 0.0–0.1)
Basophils Relative: 1 %
Eosinophils Absolute: 0.1 10*3/uL (ref 0.0–0.5)
Eosinophils Relative: 4 %
HCT: 37.2 % (ref 36.0–46.0)
Hemoglobin: 12.2 g/dL (ref 12.0–15.0)
Immature Granulocytes: 0 %
Lymphocytes Relative: 41 %
Lymphs Abs: 1.4 10*3/uL (ref 0.7–4.0)
MCH: 26.3 pg (ref 26.0–34.0)
MCHC: 32.8 g/dL (ref 30.0–36.0)
MCV: 80.3 fL (ref 80.0–100.0)
Monocytes Absolute: 0.4 10*3/uL (ref 0.1–1.0)
Monocytes Relative: 12 %
Neutro Abs: 1.5 10*3/uL — ABNORMAL LOW (ref 1.7–7.7)
Neutrophils Relative %: 42 %
Platelets: 281 10*3/uL (ref 150–400)
RBC: 4.63 MIL/uL (ref 3.87–5.11)
RDW: 15 % (ref 11.5–15.5)
WBC: 3.5 10*3/uL — ABNORMAL LOW (ref 4.0–10.5)
nRBC: 0 % (ref 0.0–0.2)

## 2023-03-24 LAB — LIPID PANEL
Cholesterol: 143 mg/dL (ref 0–200)
HDL: 41 mg/dL (ref >40)
LDL Cholesterol: 89 mg/dL (ref 0–99)
Total CHOL/HDL Ratio: 3.5 RATIO
Triglycerides: 66 mg/dL (ref <150)
VLDL: 13 mg/dL (ref 0–40)

## 2023-03-24 LAB — TSH: TSH: 0.852 u[IU]/mL (ref 0.350–4.500)

## 2023-03-24 MED ORDER — OLANZAPINE 10 MG IM SOLR
10.0000 mg | Freq: Once | INTRAMUSCULAR | Status: DC
  Filled 2023-03-24: qty 10

## 2023-03-24 MED ORDER — NICOTINE 21 MG/24HR TD PT24
21.0000 mg | MEDICATED_PATCH | Freq: Once | TRANSDERMAL | Status: DC
  Administered 2023-03-24: 21 mg via TRANSDERMAL
  Filled 2023-03-24: qty 1

## 2023-03-24 NOTE — ED Notes (Signed)
Pt sleeping@this time. Breathing even and unlabored. Will continue to monitor for safety 

## 2023-03-24 NOTE — ED Notes (Signed)
Patient is currently walking around the unit, visibly mad due to her being told by the provider that she will be going inpatient. The patient is heard cussing saying that "she will go to jail, before she goes to [facility]

## 2023-03-24 NOTE — ED Notes (Signed)
Pt is currently sleeping, no distress noted, environmental check complete, will continue to monitor patient for safety.  

## 2023-03-24 NOTE — ED Notes (Signed)
Pt awake & resting at present, no distress noted.  Monitoring for safety. 
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
## 2023-03-24 NOTE — ED Notes (Addendum)
Pt blood pressure was low we asked her if she can sit up to take another she started yelling saying we was getting on her nerves she wasnts to be lft alone and she does not feel like getting up RN Joanie Coddington was notified

## 2023-03-24 NOTE — ED Notes (Signed)
Patient refused medications.  

## 2023-03-24 NOTE — ED Notes (Signed)
Patient was discharged to [facility] in [place] by the sheriff department. Patient's mother was informed. Patient refused her dose of Zyprexa.

## 2023-03-29 ENCOUNTER — Ambulatory Visit (HOSPITAL_COMMUNITY): Payer: Medicaid Other | Admitting: Psychiatry

## 2023-05-06 ENCOUNTER — Emergency Department (HOSPITAL_COMMUNITY): Payer: Medicaid Other

## 2023-05-06 ENCOUNTER — Emergency Department (HOSPITAL_COMMUNITY)
Admission: EM | Admit: 2023-05-06 | Discharge: 2023-05-06 | Disposition: A | Discharge status: Home / Self Care | Payer: Medicaid Other | Attending: Emergency Medicine | Admitting: Emergency Medicine

## 2023-05-06 ENCOUNTER — Other Ambulatory Visit: Payer: Self-pay

## 2023-05-06 ENCOUNTER — Encounter (HOSPITAL_COMMUNITY): Payer: Self-pay

## 2023-05-06 DIAGNOSIS — J069 Acute upper respiratory infection, unspecified: Secondary | ICD-10-CM | POA: Insufficient documentation

## 2023-05-06 DIAGNOSIS — Z1152 Encounter for screening for COVID-19: Secondary | ICD-10-CM | POA: Diagnosis not present

## 2023-05-06 DIAGNOSIS — Z91010 Allergy to peanuts: Secondary | ICD-10-CM | POA: Insufficient documentation

## 2023-05-06 DIAGNOSIS — R059 Cough, unspecified: Secondary | ICD-10-CM | POA: Diagnosis present

## 2023-05-06 LAB — SARS CORONAVIRUS 2 BY RT PCR: SARS Coronavirus 2 by RT PCR: NEGATIVE

## 2023-05-06 MED ORDER — GUAIFENESIN-DM 100-10 MG/5ML PO SYRP: 10.0000 mL | ORAL_SOLUTION | ORAL | Status: DC | PRN

## 2023-05-06 MED ORDER — GUAIFENESIN-DM 100-10 MG/5ML PO SYRP
10.0000 mL | ORAL_SOLUTION | Freq: Once | ORAL | Status: AC
  Administered 2023-05-06: 10 mL via ORAL
  Filled 2023-05-06: qty 10

## 2023-05-06 MED ORDER — ALBUTEROL SULFATE HFA 108 (90 BASE) MCG/ACT IN AERS
1.0000 | INHALATION_SPRAY | Freq: Once | RESPIRATORY_TRACT | Status: AC
  Administered 2023-05-06: 2 via RESPIRATORY_TRACT
  Filled 2023-05-06: qty 6.7

## 2023-05-06 NOTE — ED Notes (Signed)
Pt left without being discharged at 1445.

## 2023-05-06 NOTE — ED Triage Notes (Signed)
Pt c/o cough x 1 week. Pt states that she has been [facility]. Pt states she has had productive cough. Denies N/V.

## 2023-05-06 NOTE — ED Provider Notes (Signed)
[employer] EMERGENCY DEPARTMENT AT [facility] Provider Note   CSN: [id number] Arrival date & time: 05/06/23  1112     History  Chief Complaint  Patient presents with   Cough    Tamara Guzman is a 22 y.o. female.  With a history of anxiety, depression, ADHD, PTSD who presents to the ED for evaluation of cough, congestion, rhinorrhea.  Symptoms have been present for the past week.  States the cough is causing some anterior chest wall pain as well.  Cough is occasionally productive of yellow sputum.  She reports some shortness of breath after episodes of coughing.  She denies known sick contacts.  She has been taking Mucinex without improvement in her symptoms.  She denies fevers, chills, unilateral leg swelling, history of DVT or PE, recent cancer treatments, surgeries, long distance travel, hemoptysis, estrogen use.  She denies smoking.  States she used to smoke marijuana but stopped approximately 1 year ago.   Cough Associated symptoms: shortness of breath        Home Medications Prior to Admission medications   Medication Sig Start Date End Date Taking? Authorizing Provider  ARIPiprazole (ABILIFY) 20 MG tablet Take 1 tablet (20 mg total) by mouth at bedtime. 07/13/18 03/31/20  Leata Mouse, MD  cloNIDine HCl (KAPVAY) 0.1 MG TB12 ER tablet Take 2 tablets (0.2 mg total) by mouth at bedtime. 07/13/18 03/31/20  Leata Mouse, MD  dicyclomine (BENTYL) 20 MG tablet Take 20 mg by mouth 4 (four) times daily. 04/26/18 03/31/20  [provider]  FLUoxetine (PROZAC) 20 MG capsule Take 1 capsule (20 mg total) by mouth every morning. 07/13/18 03/31/20  Leata Mouse, MD  lansoprazole (PREVACID) 15 MG capsule Take 1 capsule (15 mg total) by mouth daily. 10/06/19 03/31/20  Niel Hummer, MD  ranitidine (ZANTAC) 150 MG capsule Take 150 mg by mouth 2 (two) times daily. 04/14/18 03/31/20  [provider]      Allergies    Other, Chocolate, and  Peanut-containing drug products    Review of Systems   Review of Systems  Respiratory:  Positive for cough and shortness of breath.   All other systems reviewed and are negative.   Physical Exam Updated Vital Signs BP 131/84 (BP Location: Right Arm)   Pulse 86   Temp 98.2 F (36.8 C) (Oral)   Resp 20   Ht 5\' 2"  (1.575 m)   Wt 72.6 kg   SpO2 95%   BMI 29.26 kg/m  Physical Exam Vitals and nursing note reviewed.  Constitutional:      General: She is not in acute distress.    Appearance: She is well-developed.     Comments: Resting comfortably in bed  HENT:     Head: Normocephalic and atraumatic.     Mouth/Throat:     Mouth: Mucous membranes are moist.     Pharynx: Oropharynx is clear. Posterior oropharyngeal erythema (Posterior pharyngeal) present.  Eyes:     Conjunctiva/sclera: Conjunctivae normal.  Cardiovascular:     Rate and Rhythm: Normal rate and regular rhythm.     Heart sounds: No murmur heard. Pulmonary:     Effort: Pulmonary effort is normal. No respiratory distress.     Breath sounds: Normal breath sounds. No wheezing, rhonchi or rales.     Comments: Nonproductive cough noted Abdominal:     Palpations: Abdomen is soft.     Tenderness: There is no abdominal tenderness.  Musculoskeletal:        General: No swelling.  Cervical back: Neck supple.     Right lower leg: No edema.     Left lower leg: No edema.  Skin:    General: Skin is warm and dry.     Capillary Refill: Capillary refill takes less than 2 seconds.  Neurological:     General: No focal deficit present.     Mental Status: She is alert and oriented to person, place, and time.  Psychiatric:        Mood and Affect: Mood normal.        Behavior: Behavior normal.     ED Results / Procedures / Treatments   Labs (all labs ordered are listed, but only abnormal results are displayed) Labs Reviewed  SARS CORONAVIRUS 2 BY RT PCR    EKG None  Radiology DG Chest 2 View  Result Date:  05/06/2023 CLINICAL DATA:  Cough, shortness of breath EXAM: CHEST - 2 VIEW COMPARISON:  10/06/2019 FINDINGS: The heart size and mediastinal contours are within normal limits. Both lungs are clear. The visualized skeletal structures are unremarkable. IMPRESSION: No active cardiopulmonary disease. Electronically Signed   By: Ernie Avena M.D.   On: 05/06/2023 12:27    Procedures Procedures    Medications Ordered in ED Medications  albuterol (VENTOLIN HFA) 108 (90 Base) MCG/ACT inhaler 1-2 puff (2 puffs Inhalation Given 05/06/23 1243)  guaiFENesin-dextromethorphan (ROBITUSSIN DM) 100-10 MG/5ML syrup 10 mL (10 mLs Oral Given 05/06/23 1405)    ED Course/ Medical Decision Making/ A&P Clinical Course as of 05/06/23 1450  Fri May 06, 2023  1446 Patient eloped from the department without notifying staff [AS]    Clinical Course User Index [AS] Lula Olszewski Edsel Petrin, PA-C                             Medical Decision Making Amount and/or Complexity of Data Reviewed Radiology: ordered.  Risk OTC drugs. Prescription drug management.  This patient presents to the ED for concern of URI symptoms, this involves an extensive number of treatment options, and is a complaint that carries with it a high risk of complications and morbidity.  Differential diagnosis for emergent cause of cough includes but is not limited to upper respiratory infection, lower respiratory infection, allergies, asthma, irritants, foreign body, medications such as ACE inhibitors, reflux, asthma, CHF, lung cancer, interstitial lung disease, psychiatric causes, postnasal drip and postinfectious bronchospasm.   Co morbidities that complicate the patient evaluation  anxiety, depression, ADHD, PTSD   My initial workup includes COVID swab, chest x-ray, EKG  Additional history obtained from: Nursing notes from this visit.  I ordered, reviewed and interpreted labs which include: COVID  I ordered imaging studies including  chest x-ray I independently visualized and interpreted imaging which showed normal I agree with the radiologist interpretation  Afebrile, hemodynamically stable.  22 year old female presenting to the ED for evaluation of cough, congestion, rhinorrhea.  Constellation of symptoms consistent with a viral upper respiratory infection.  She has a nonproductive cough on physical exam but appears overall very well.  No adventitious breath sounds.  She is not hypoxic or tachypneic.  She is PERC negative.  She was given Robitussin and an albuterol inhaler in the ED for her symptoms.  Patient then eloped from the department prior to me being able to assess her response to treatments.  She did not notify staff prior to elopement.  I was unable to speak with patient regarding return precautions or symptom control at  home.  I was also unable to speak with patient regarding the necessity for follow-up.  Note: Portions of this report may have been transcribed using voice recognition software. Every effort was made to ensure accuracy; however, inadvertent computerized transcription errors may still be present.        Final Clinical Impression(s) / ED Diagnoses Final diagnoses:  Viral URI with cough    Rx / DC Orders ED Discharge Orders     None         Michelle Piper, Cordelia Poche 05/06/23 1450    Rolan Bucco, MD 05/07/23 1259

## 2023-05-06 NOTE — Discharge Instructions (Addendum)
You left the department without notifying staff.  This is very dangerous.  Return if your symptoms worsen.

## 2023-12-27 ENCOUNTER — Ambulatory Visit (HOSPITAL_COMMUNITY)
Admission: EM | Admit: 2023-12-27 | Discharge: 2023-12-27 | Disposition: A | Discharge status: Home / Self Care | Payer: MEDICAID
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
## 2023-12-30 ENCOUNTER — Ambulatory Visit (HOSPITAL_COMMUNITY)
Admission: EM | Admit: 2023-12-30 | Discharge: 2024-01-01 | Disposition: A | Discharge status: Other Acute Inpatient Hospital | Payer: MEDICAID | Attending: Nurse Practitioner | Admitting: Nurse Practitioner

## 2023-12-30 DIAGNOSIS — F331 Major depressive disorder, recurrent, moderate: Secondary | ICD-10-CM

## 2023-12-30 DIAGNOSIS — R45851 Suicidal ideations: Secondary | ICD-10-CM | POA: Insufficient documentation

## 2023-12-30 DIAGNOSIS — F3112 Bipolar disorder, current episode manic without psychotic features, moderate: Secondary | ICD-10-CM | POA: Diagnosis present

## 2023-12-30 DIAGNOSIS — F319 Bipolar disorder, unspecified: Secondary | ICD-10-CM | POA: Insufficient documentation

## 2023-12-30 DIAGNOSIS — Z9151 Personal history of suicidal behavior: Secondary | ICD-10-CM | POA: Insufficient documentation
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
## 2023-12-31 ENCOUNTER — Inpatient Hospital Stay: Admission: RE | Admit: 2023-12-31 | Payer: MEDICAID | Source: Ambulatory Visit

## 2023-12-31 DIAGNOSIS — F331 Major depressive disorder, recurrent, moderate: Secondary | ICD-10-CM | POA: Diagnosis not present

## 2023-12-31 LAB — CBC WITH DIFFERENTIAL/PLATELET
Abs Immature Granulocytes: 0.01 10*3/uL (ref 0.00–0.07)
Basophils Absolute: 0 10*3/uL (ref 0.0–0.1)
Basophils Relative: 1 %
Eosinophils Absolute: 0 10*3/uL (ref 0.0–0.5)
Eosinophils Relative: 1 %
HCT: 37.2 % (ref 36.0–46.0)
Hemoglobin: 12.1 g/dL (ref 12.0–15.0)
Immature Granulocytes: 0 %
Lymphocytes Relative: 18 %
Lymphs Abs: 0.7 10*3/uL (ref 0.7–4.0)
MCH: 26.4 pg (ref 26.0–34.0)
MCHC: 32.5 g/dL (ref 30.0–36.0)
MCV: 81 fL (ref 80.0–100.0)
Monocytes Absolute: 0.6 10*3/uL (ref 0.1–1.0)
Monocytes Relative: 14 %
Neutro Abs: 2.8 10*3/uL (ref 1.7–7.7)
Neutrophils Relative %: 66 %
Platelets: 286 10*3/uL (ref 150–400)
RBC: 4.59 MIL/uL (ref 3.87–5.11)
RDW: 14 % (ref 11.5–15.5)
WBC: 4.2 10*3/uL (ref 4.0–10.5)
nRBC: 0 % (ref 0.0–0.2)

## 2023-12-31 LAB — LIPID PANEL
Cholesterol: 148 mg/dL (ref 0–200)
HDL: 51 mg/dL (ref >40)
LDL Cholesterol: 86 mg/dL (ref 0–99)
Total CHOL/HDL Ratio: 2.9 {ratio}
Triglycerides: 55 mg/dL (ref <150)
VLDL: 11 mg/dL (ref 0–40)

## 2023-12-31 LAB — COMPREHENSIVE METABOLIC PANEL
ALT: 12 U/L (ref 0–44)
AST: 36 U/L (ref 15–41)
Albumin: 3.9 g/dL (ref 3.5–5.0)
Alkaline Phosphatase: 67 U/L (ref 38–126)
Anion gap: 8 (ref 5–15)
BUN: 13 mg/dL (ref 6–20)
CO2: 29 mmol/L (ref 22–32)
Calcium: 9 mg/dL (ref 8.9–10.3)
Chloride: 95 mmol/L — ABNORMAL LOW (ref 98–111)
Creatinine, Ser: 0.98 mg/dL (ref 0.44–1.00)
GFR, Estimated: >60 mL/min (ref >60)
Glucose, Bld: 62 mg/dL — ABNORMAL LOW (ref 70–99)
Potassium: 3.6 mmol/L (ref 3.5–5.1)
Sodium: 132 mmol/L — ABNORMAL LOW (ref 135–145)
Total Bilirubin: 0.8 mg/dL (ref 0.0–1.2)
Total Protein: 6.9 g/dL (ref 6.5–8.1)

## 2023-12-31 LAB — POC SARS CORONAVIRUS 2 AG: SARSCOV2ONAVIRUS 2 AG: NEGATIVE

## 2023-12-31 LAB — TSH: TSH: 1.189 u[IU]/mL (ref 0.350–4.500)

## 2023-12-31 LAB — ETHANOL: Alcohol, Ethyl (B): <10 mg/dL (ref <10)

## 2023-12-31 LAB — HEMOGLOBIN A1C
Hgb A1c MFr Bld: 5.2 % (ref 4.8–5.6)
Mean Plasma Glucose: 102.54 mg/dL

## 2023-12-31 MED ORDER — HYDROXYZINE HCL 25 MG PO TABS: 25.0000 mg | ORAL_TABLET | Freq: Three times a day (TID) | ORAL | Status: DC | PRN

## 2023-12-31 MED ORDER — ARIPIPRAZOLE 5 MG PO TABS
5.0000 mg | ORAL_TABLET | Freq: Every day | ORAL | Status: DC
  Administered 2023-12-31 – 2024-01-01 (×2): 5 mg via ORAL
  Filled 2023-12-31 (×2): qty 1

## 2023-12-31 MED ORDER — DM-GUAIFENESIN ER 30-600 MG PO TB12
1.0000 | ORAL_TABLET | Freq: Once | ORAL | Status: AC
  Administered 2023-12-31: 1 via ORAL
  Filled 2023-12-31: qty 1

## 2023-12-31 MED ORDER — TRAZODONE HCL 50 MG PO TABS
50.0000 mg | ORAL_TABLET | Freq: Every evening | ORAL | Status: DC | PRN
  Administered 2023-12-31: 50 mg via ORAL
  Filled 2023-12-31: qty 1

## 2023-12-31 MED ORDER — MAGNESIUM HYDROXIDE 400 MG/5ML PO SUSP: 30.0000 mL | Freq: Every day | ORAL | Status: DC | PRN

## 2023-12-31 MED ORDER — ALUM & MAG HYDROXIDE-SIMETH 200-200-20 MG/5ML PO SUSP: 30.0000 mL | ORAL | Status: DC | PRN

## 2023-12-31 MED ORDER — ACETAMINOPHEN 325 MG PO TABS: 650.0000 mg | ORAL_TABLET | Freq: Four times a day (QID) | ORAL | Status: DC | PRN

## 2023-12-31 MED ORDER — GUAIFENESIN ER 600 MG PO TB12: 600.0000 mg | ORAL_TABLET | Freq: Two times a day (BID) | ORAL | Status: DC | PRN

## 2023-12-31 NOTE — ED Notes (Signed)
Pt lying down on bed asleep but exhjibit nonproductive cough. NP notified for sx relief medication. Awaiting orders. Will continue to monitor for safety.

## 2023-12-31 NOTE — ED Notes (Signed)
Pt sitting in dayroom eating her dinner. No acute distress noted. No concerns voiced. Informed pt to notify staff with any needs or assistance. Pt verbalized understanding or agreement. Will continue to monitor for safety.

## 2023-12-31 NOTE — ED Notes (Signed)
MHT went to obtain morning vitals pt started yelling loudly cursing and refusing vitals

## 2023-12-31 NOTE — ED Provider Notes (Signed)
[facility] Continuous Assessment Admission H&P  Date: 12/31/23 Patient Name: Tamara Guzman MRN: [id number] Chief Complaint: depressed and feeling suicidal  Diagnoses:  Final diagnoses:  MDD (major depressive disorder), recurrent episode, moderate (HCC)    HPI: Tamara Guzman is a 23 y/o single female with a history of Bipolar Disorder, ADHD, PTSD, intentional drug overdose presenting with worsening depression symptoms and suicidal ideation for the past two months.   Nurse practitioner assessed patient face to face and reviewed her chart. Patient is alert oriented x3, appears agitated and irritable, speech is pressured, mood is depressed with tearful affect, suicidal ideation with no specific plan or intent, denies any homicidal ideation. Patient does not appear to be responding to any internal or external stimuli or be experiencing any paranoia or delusions.   Patient reports that she has been having suicidal ideation for the past two months but without a specific plan or intent. Patient reports that she has been arguing a lot with her boyfriend, acting irrational with outbursts, easily frustrated, not sleeping, eating a lot and smoking three marijuana blunts daily. Patient reports that she has a 81 year old daughter and her boyfriend will speak negatively about her in front of her daughter. Patient reports that she is not on any medications or receiving therapy. Patient reports that her primary stressor is living with her boyfriend. Patient states that she wants to get back on her medications. Patient reports that she previously received ACTT services from [facility] about 2-3 years ago. Patient reports that she did not feel her ACTT listened to her.  Patient will be admitted to [facility] continuous observation for crisis management, safety and stabilization.   Total Time spent with patient: 20 minutes  Musculoskeletal  Strength & Muscle Tone: within normal limits Gait & Station:  normal Patient leans: N/A  Psychiatric Specialty Exam  Presentation General Appearance:  Casual  Eye Contact: Fair  Speech: Pressured  Speech Volume: Normal  Handedness: Right   Mood and Affect  Mood: Depressed  Affect: Appropriate   Thought Process  Thought Processes: Coherent  Descriptions of Associations:Intact  Orientation:Full (Time, Place and Person)  Thought Content:Logical  Diagnosis of Schizophrenia or Schizoaffective disorder in past: No   Hallucinations:Hallucinations: None  Ideas of Reference:None  Suicidal Thoughts:Suicidal Thoughts: Yes, Passive SI Passive Intent and/or Plan: Without Intent; Without Plan  Homicidal Thoughts:Homicidal Thoughts: No   Sensorium  Memory: Immediate Fair; Remote Fair; Recent Fair  Judgment: Fair  Insight: Fair   Art therapist  Concentration: Fair  Attention Span: Fair  Recall: [employer] of Knowledge: Fair  Language: Fair   Psychomotor Activity  Psychomotor Activity: Psychomotor Activity: Normal   Assets  Assets: Manufacturing systems engineer; Housing; Physical Health   Sleep  Sleep: Sleep: Fair Number of Hours of Sleep: 5   Nutritional Assessment (For OBS and FBC admissions only) Has the patient had a weight loss or gain of 10 pounds or more in the last 3 months?: No Has the patient had a decrease in food intake/or appetite?: No Does the patient have dental problems?: No Does the patient have eating habits or behaviors that may be indicators of an eating disorder including binging or inducing vomiting?: No Has the patient recently lost weight without trying?: 0 Has the patient been eating poorly because of a decreased appetite?: 0 Malnutrition Screening Tool Score: 0    Physical Exam HENT:     Head: Normocephalic and atraumatic.     Nose: Nose normal.  Eyes:  Pupils: Pupils are equal, round, and reactive to light.  Cardiovascular:     Rate and Rhythm: Normal rate.   Pulmonary:     Effort: Pulmonary effort is normal.  Abdominal:     General: Abdomen is flat.  Musculoskeletal:        General: Normal range of motion.     Cervical back: Normal range of motion.  Skin:    General: Skin is warm.  Neurological:     Mental Status: She is alert and oriented to person, place, and time.  Psychiatric:        Attention and Perception: Attention normal.        Mood and Affect: Mood is depressed.        Speech: Speech is rapid and pressured.        Behavior: Behavior is cooperative.        Thought Content: Thought content is not paranoid or delusional. Thought content includes suicidal ideation. Thought content does not include homicidal ideation. Thought content does not include homicidal or suicidal plan.        Cognition and Memory: Cognition normal.        Judgment: Judgment is impulsive.    Review of Systems  Constitutional: Negative.   HENT: Negative.    Eyes: Negative.   Respiratory: Negative.    Cardiovascular: Negative.   Gastrointestinal: Negative.   Genitourinary: Negative.   Musculoskeletal: Negative.   Skin: Negative.   Neurological: Negative.   Endo/Heme/Allergies: Negative.   Psychiatric/Behavioral:  Positive for depression and suicidal ideas.     Blood pressure 116/68, pulse (!) 112, temperature 98.6 F (37 C), temperature source Oral, resp. rate 20, SpO2 100%. There is no height or weight on file to calculate BMI.  Past Psychiatric History: [facility] 04/07/00-04/13/2020  Is the patient at risk to self? Yes  Has the patient been a risk to self in the past 6 months? Yes .    Has the patient been a risk to self within the distant past? Yes   Is the patient a risk to others? No   Has the patient been a risk to others in the past 6 months? No   Has the patient been a risk to others within the distant past? No   Past Medical History:  Past Medical History:  Diagnosis Date   ADHD    Anxiety    Bipolar 1 disorder (HCC)     Depression    Obesity      Family History:  Family History  Problem Relation Age of Onset   Hypertension Mother    Diabetes Mother    Healthy Father      Social History: 43 y/o single female unemployed with a 3 y/o daughter, lives with child's father  Social History   Socioeconomic History   Marital status: Single    Spouse name: Not on file   Number of children: Not on file   Years of education: Not on file   Highest education level: Not on file  Occupational History   Not on file  Tobacco Use   Smoking status: Never   Smokeless tobacco: Never  Vaping Use   Vaping status: Never Used  Substance and Sexual Activity   Alcohol use: Not Currently   Drug use: Yes    Types: Marijuana   Sexual activity: Yes    Birth control/protection: None  Other Topics Concern   Not on file  Social History Narrative   Not on file  Social Drivers of Corporate investment banker Strain: Not on file  Food Insecurity: No Food Insecurity (12/31/2023)   Hunger Vital Sign    Worried About Running Out of Food in the Last Year: Never true    Ran Out of Food in the Last Year: Never true  Transportation Needs: No Transportation Needs (12/31/2023)   PRAPARE - Administrator, Civil Service (Medical): No    Lack of Transportation (Non-Medical): No  Physical Activity: Not on file  Stress: Not on file  Social Connections: Not on file  Intimate Partner Violence: Not At Risk (12/31/2023)   Humiliation, Afraid, Rape, and Kick questionnaire    Fear of Current or Ex-Partner: No    Emotionally Abused: No    Physically Abused: No    Sexually Abused: No   hx  Last Labs:  Admission on 12/30/2023  Component Date Value Ref Range Status   WBC 12/31/2023 4.2  4.0 - 10.5 K/uL Final   RBC 12/31/2023 4.59  3.87 - 5.11 MIL/uL Final   Hemoglobin 12/31/2023 12.1  12.0 - 15.0 g/dL Final   HCT 33/29/5188 37.2  36.0 - 46.0 % Final   MCV 12/31/2023 81.0  80.0 - 100.0 fL Final   MCH 12/31/2023 26.4   26.0 - 34.0 pg Final   MCHC 12/31/2023 32.5  30.0 - 36.0 g/dL Final   RDW 41/66/0630 14.0  11.5 - 15.5 % Final   Platelets 12/31/2023 286  150 - 400 K/uL Final   nRBC 12/31/2023 0.0  0.0 - 0.2 % Final   Neutrophils Relative % 12/31/2023 66  % Final   Neutro Abs 12/31/2023 2.8  1.7 - 7.7 K/uL Final   Lymphocytes Relative 12/31/2023 18  % Final   Lymphs Abs 12/31/2023 0.7  0.7 - 4.0 K/uL Final   Monocytes Relative 12/31/2023 14  % Final   Monocytes Absolute 12/31/2023 0.6  0.1 - 1.0 K/uL Final   Eosinophils Relative 12/31/2023 1  % Final   Eosinophils Absolute 12/31/2023 0.0  0.0 - 0.5 K/uL Final   Basophils Relative 12/31/2023 1  % Final   Basophils Absolute 12/31/2023 0.0  0.0 - 0.1 K/uL Final   Immature Granulocytes 12/31/2023 0  % Final   Abs Immature Granulocytes 12/31/2023 0.01  0.00 - 0.07 K/uL Final   Performed at [facility], [address], [place], [place] [address]   Sodium 12/31/2023 132 (L)  135 - 145 mmol/L Final   Potassium 12/31/2023 3.6  3.5 - 5.1 mmol/L Final   Chloride 12/31/2023 95 (L)  98 - 111 mmol/L Final   CO2 12/31/2023 29  22 - 32 mmol/L Final   Glucose, Bld 12/31/2023 62 (L)  70 - 99 mg/dL Final   Glucose reference range applies only to samples taken after fasting for at least 8 hours.   BUN 12/31/2023 13  6 - 20 mg/dL Final   Creatinine, Ser 12/31/2023 0.98  0.44 - 1.00 mg/dL Final   Calcium 93/23/5573 9.0  8.9 - 10.3 mg/dL Final   Total Protein 22/01/5426 6.9  6.5 - 8.1 g/dL Final   Albumin 06/05/7627 3.9  3.5 - 5.0 g/dL Final   AST 31/51/7616 36  15 - 41 U/L Final   ALT 12/31/2023 12  0 - 44 U/L Final   Alkaline Phosphatase 12/31/2023 67  38 - 126 U/L Final   Total Bilirubin 12/31/2023 0.8  0.0 - 1.2 mg/dL Final   GFR, Estimated 12/31/2023 >60  >60 mL/min Final  Comment: (NOTE) Calculated using the CKD-EPI Creatinine Equation (2021)    Anion gap 12/31/2023 8  5 - 15 Final   Performed at [facility], [address] [place], [place]  [address]   Hgb A1c MFr Bld 12/31/2023 5.2  4.8 - 5.6 % Final   Comment: (NOTE) Pre diabetes:          5.7%-6.4%  Diabetes:              >6.4%  Glycemic control for   <7.0% adults with diabetes    Mean Plasma Glucose 12/31/2023 102.54  mg/dL Final   Performed at [facility], [address], [place], [place] [address]   Alcohol, Ethyl (B) 12/31/2023 <10  <10 mg/dL Final   Comment: (NOTE) Lowest detectable limit for serum alcohol is 10 mg/dL.  For medical purposes only. Performed at [facility], [address] [place], [place] [address]    Cholesterol 12/31/2023 148  0 - 200 mg/dL Final   Triglycerides 29/56/2130 55  <150 mg/dL Final   HDL 86/57/8469 51  >40 mg/dL Final   Total CHOL/HDL Ratio 12/31/2023 2.9  RATIO Final   VLDL 12/31/2023 11  0 - 40 mg/dL Final   LDL Cholesterol 12/31/2023 86  0 - 99 mg/dL Final   Comment:        Total Cholesterol/HDL:CHD Risk Coronary Heart Disease Risk Table                     Men   Women  1/2 Average Risk   3.4   3.3  Average Risk       5.0   4.4  2 X Average Risk   9.6   7.1  3 X Average Risk  23.4   11.0        Use the calculated Patient Ratio above and the CHD Risk Table to determine the patient's CHD Risk.        ATP III CLASSIFICATION (LDL):  <100     mg/dL   Optimal  629-528  mg/dL   Near or Above                    Optimal  130-159  mg/dL   Borderline  413-244  mg/dL   High  >010     mg/dL   Very High Performed at [facility], [address] [place], [place] [address]    TSH 12/31/2023 1.189  0.350 - 4.500 uIU/mL Final   Comment: Performed by a 3rd Generation assay with a functional sensitivity of <=0.01 uIU/mL. Performed at [facility], [address] [place], [place] [address]    SARSCOV2ONAVIRUS 2 AG 12/31/2023 NEGATIVE  NEGATIVE Final   Comment: (NOTE) SARS-CoV-2 antigen NOT DETECTED.   Negative results are presumptive.  Negative results do not preclude SARS-CoV-2 infection and should not  be used as the sole basis for treatment or other patient management decisions, including infection  control decisions, particularly in the presence of clinical signs and  symptoms consistent with COVID-19, or in those who have been in contact with the virus.  Negative results must be combined with clinical observations, patient history, and epidemiological information. The expected result is Negative.  Fact Sheet for Patients: https://www.jennings-kim.com/  Fact Sheet for Healthcare Providers: https://alexander-rogers.biz/  This test is not yet approved or cleared by the [place] FDA and  has been authorized for detection and/or diagnosis of SARS-CoV-2 by  FDA under an Emergency Use Authorization (EUA).  This EUA will remain in effect (meaning this test can be used) for the duration of  the COV                          ID-19 declaration under Section 564(b)(1) of the Act, 21 U.S.C. section 360bbb-3(b)(1), unless the authorization is terminated or revoked sooner.      Allergies: Other, Chocolate, and Peanut-containing drug products  Medications:  Facility Ordered Medications  Medication   acetaminophen (TYLENOL) tablet 650 mg   alum & mag hydroxide-simeth (MAALOX/MYLANTA) 200-200-20 MG/5ML suspension 30 mL   magnesium hydroxide (MILK OF MAGNESIA) suspension 30 mL   hydrOXYzine (ATARAX) tablet 25 mg   traZODone (DESYREL) tablet 50 mg   ARIPiprazole (ABILIFY) tablet 5 mg   [COMPLETED] dextromethorphan-guaiFENesin (MUCINEX DM) 30-600 MG per 12 hr tablet 1 tablet      Medical Decision Making   Nayelie Grabiec is a 23 y/o single female with a history of Bipolar Disorder, ADHD, PTSD, intentional drug overdose presenting with worsening depression symptoms and suicidal ideation for the past two months.     Recommendations  Based on my evaluation the patient does not appear to have an emergency medical condition. Patient will be admitted to [facility]  continuous observation for crisis management, safety and stabilization.   Jasper Riling, NP 12/31/23  6:01 AM

## 2023-12-31 NOTE — ED Notes (Signed)
Pt sleeping in no acute distress. RR even and unlabored. Environment secured. Will continue to monitor for safety. 

## 2023-12-31 NOTE — ED Notes (Signed)
Pt is up laying on recliner talking with Rn she is calm but she is having a cough (see Mar) pt had COVID test and it was negative

## 2023-12-31 NOTE — ED Notes (Signed)
Patient A&Ox4. Patient present to OBS with SI with no plan or intent. Reports also anger issues. Patient denies AVH.  Patient denies any physical complaints when asked. No acute distress noted. Support and encouragement provided. Routine safety checks conducted according to facility protocol. Encouraged patient to notify staff if thoughts of harm toward self or others arise. Patient verbalize understanding and agreement. Will continue to monitor for safety.

## 2023-12-31 NOTE — ED Notes (Addendum)
Patient present with a non-productive cough. Cough is dry with no sputum production. No associated symptoms such as wheezing or shortness of breath noted. Patient denies Chest or fever. Roselyn Bering, NP was notified and new orders was received. Patient was medicated per MAT orders. POC Covid test negative. Will continue to monitor and provide supportive care as needed.

## 2023-12-31 NOTE — ED Notes (Signed)
Writer approached pt's bed and explained that she was accepted to BMU @[facility]  and pt refused to sign voluntary consent form. NP approached pt's bed and pt proceeded to explain that she don't want to go outside [place] due to having a child in area. Writer and NP tried to encourage pt to go for a couple of days but pt refused (crying). No safety concerns noted. Will continue to monitor for safety.
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
## 2023-12-31 NOTE — ED Notes (Signed)
Patient A&Ox4. Denies intent to harm self/others when asked. Denies A/VH. Patient denies any physical complaints when asked. Demanding of her needs from staff. Not easily redirected. Routine safety checks conducted according to facility protocol. Encouraged patient to notify staff if thoughts of harm toward self or others arise. Patient verbalize understanding and agreement. Will continue to monitor for safety.

## 2024-01-01 ENCOUNTER — Encounter (HOSPITAL_COMMUNITY): Payer: Self-pay | Admitting: Psychiatry

## 2024-01-01 ENCOUNTER — Inpatient Hospital Stay (HOSPITAL_COMMUNITY)
Admission: AD | Admit: 2024-01-01 | Discharge: 2024-01-05 | DRG: 885 | Disposition: A | Discharge status: Home / Self Care | Payer: MEDICAID | Source: Intra-hospital | Attending: Psychiatry | Admitting: Psychiatry

## 2024-01-01 ENCOUNTER — Other Ambulatory Visit: Payer: Self-pay

## 2024-01-01 DIAGNOSIS — I959 Hypotension, unspecified: Secondary | ICD-10-CM | POA: Diagnosis not present

## 2024-01-01 DIAGNOSIS — E559 Vitamin D deficiency, unspecified: Secondary | ICD-10-CM | POA: Diagnosis present

## 2024-01-01 DIAGNOSIS — Z833 Family history of diabetes mellitus: Secondary | ICD-10-CM

## 2024-01-01 DIAGNOSIS — F1990 Other psychoactive substance use, unspecified, uncomplicated: Secondary | ICD-10-CM | POA: Insufficient documentation

## 2024-01-01 DIAGNOSIS — F603 Borderline personality disorder: Secondary | ICD-10-CM | POA: Diagnosis present

## 2024-01-01 DIAGNOSIS — Z560 Unemployment, unspecified: Secondary | ICD-10-CM | POA: Diagnosis not present

## 2024-01-01 DIAGNOSIS — Z8249 Family history of ischemic heart disease and other diseases of the circulatory system: Secondary | ICD-10-CM

## 2024-01-01 DIAGNOSIS — F17210 Nicotine dependence, cigarettes, uncomplicated: Secondary | ICD-10-CM | POA: Diagnosis present

## 2024-01-01 DIAGNOSIS — E871 Hypo-osmolality and hyponatremia: Secondary | ICD-10-CM | POA: Diagnosis present

## 2024-01-01 DIAGNOSIS — F411 Generalized anxiety disorder: Secondary | ICD-10-CM | POA: Diagnosis present

## 2024-01-01 DIAGNOSIS — Z1152 Encounter for screening for COVID-19: Secondary | ICD-10-CM

## 2024-01-01 DIAGNOSIS — F331 Major depressive disorder, recurrent, moderate: Secondary | ICD-10-CM | POA: Diagnosis not present

## 2024-01-01 DIAGNOSIS — G4700 Insomnia, unspecified: Secondary | ICD-10-CM | POA: Diagnosis present

## 2024-01-01 DIAGNOSIS — F3112 Bipolar disorder, current episode manic without psychotic features, moderate: Principal | ICD-10-CM | POA: Diagnosis present

## 2024-01-01 DIAGNOSIS — F4310 Post-traumatic stress disorder, unspecified: Secondary | ICD-10-CM | POA: Diagnosis present

## 2024-01-01 DIAGNOSIS — Z9151 Personal history of suicidal behavior: Secondary | ICD-10-CM

## 2024-01-01 DIAGNOSIS — F1210 Cannabis abuse, uncomplicated: Secondary | ICD-10-CM | POA: Diagnosis present

## 2024-01-01 DIAGNOSIS — R45851 Suicidal ideations: Secondary | ICD-10-CM | POA: Diagnosis present

## 2024-01-01 HISTORY — DX: Suicide attempt, initial encounter: T14.91XA

## 2024-01-01 HISTORY — DX: Personal history of other mental and behavioral disorders: Z86.59

## 2024-01-01 HISTORY — DX: Post-traumatic stress disorder, unspecified: F43.10

## 2024-01-01 HISTORY — DX: Patient's other noncompliance with medication regimen for other reason: Z91.148

## 2024-01-01 HISTORY — DX: Generalized anxiety disorder: F41.1

## 2024-01-01 HISTORY — DX: Borderline personality disorder: F60.3

## 2024-01-01 LAB — POCT URINE DRUG SCREEN - MANUAL ENTRY (I-SCREEN)
POC Amphetamine UR: NOT DETECTED
POC Buprenorphine (BUP): NOT DETECTED
POC Cocaine UR: POSITIVE — AB
POC Marijuana UR: NOT DETECTED
POC Methadone UR: NOT DETECTED
POC Methamphetamine UR: NOT DETECTED
POC Morphine: NOT DETECTED
POC Oxazepam (BZO): NOT DETECTED
POC Oxycodone UR: NOT DETECTED
POC Secobarbital (BAR): NOT DETECTED

## 2024-01-01 LAB — POCT PREGNANCY, URINE: Preg Test, Ur: NEGATIVE

## 2024-01-01 MED ORDER — GUAIFENESIN ER 600 MG PO TB12: 600.0000 mg | ORAL_TABLET | Freq: Two times a day (BID) | ORAL | Status: DC | PRN

## 2024-01-01 MED ORDER — TRAZODONE HCL 50 MG PO TABS
50.0000 mg | ORAL_TABLET | Freq: Every evening | ORAL | Status: DC | PRN
  Administered 2024-01-02 – 2024-01-03 (×2): 50 mg via ORAL
  Filled 2024-01-01 (×3): qty 1

## 2024-01-01 MED ORDER — MAGNESIUM HYDROXIDE 400 MG/5ML PO SUSP: 30.0000 mL | Freq: Every day | ORAL | Status: DC | PRN

## 2024-01-01 MED ORDER — DIPHENHYDRAMINE HCL 50 MG/ML IJ SOLN: 50.0000 mg | Freq: Three times a day (TID) | INTRAMUSCULAR | Status: DC | PRN

## 2024-01-01 MED ORDER — NICOTINE POLACRILEX 2 MG MT GUM: 2.0000 mg | CHEWING_GUM | OROMUCOSAL | Status: DC | PRN

## 2024-01-01 MED ORDER — DIPHENHYDRAMINE HCL 25 MG PO CAPS: 50.0000 mg | ORAL_CAPSULE | Freq: Three times a day (TID) | ORAL | Status: DC | PRN

## 2024-01-01 MED ORDER — ACETAMINOPHEN 325 MG PO TABS: 650.0000 mg | ORAL_TABLET | Freq: Four times a day (QID) | ORAL | Status: DC | PRN

## 2024-01-01 MED ORDER — HALOPERIDOL LACTATE 5 MG/ML IJ SOLN: 5.0000 mg | Freq: Three times a day (TID) | INTRAMUSCULAR | Status: DC | PRN

## 2024-01-01 MED ORDER — ARIPIPRAZOLE 10 MG PO TABS
10.0000 mg | ORAL_TABLET | Freq: Every day | ORAL | Status: DC
  Administered 2024-01-02 – 2024-01-03 (×2): 10 mg via ORAL
  Filled 2024-01-01 (×4): qty 1

## 2024-01-01 MED ORDER — HALOPERIDOL LACTATE 5 MG/ML IJ SOLN: 10.0000 mg | Freq: Three times a day (TID) | INTRAMUSCULAR | Status: DC | PRN

## 2024-01-01 MED ORDER — ARIPIPRAZOLE 5 MG PO TABS: 5.0000 mg | ORAL_TABLET | Freq: Every day | ORAL | Status: DC

## 2024-01-01 MED ORDER — ARIPIPRAZOLE 5 MG PO TABS
5.0000 mg | ORAL_TABLET | Freq: Every day | ORAL | Status: DC
  Filled 2024-01-01: qty 1

## 2024-01-01 MED ORDER — HALOPERIDOL 5 MG PO TABS: 5.0000 mg | ORAL_TABLET | Freq: Three times a day (TID) | ORAL | Status: DC | PRN

## 2024-01-01 MED ORDER — LORAZEPAM 2 MG/ML IJ SOLN: 2.0000 mg | Freq: Three times a day (TID) | INTRAMUSCULAR | Status: DC | PRN

## 2024-01-01 MED ORDER — ALUM & MAG HYDROXIDE-SIMETH 200-200-20 MG/5ML PO SUSP: 30.0000 mL | ORAL | Status: DC | PRN

## 2024-01-01 MED ORDER — HYDROXYZINE HCL 25 MG PO TABS
25.0000 mg | ORAL_TABLET | Freq: Three times a day (TID) | ORAL | Status: DC | PRN
  Filled 2024-01-01: qty 1

## 2024-01-01 NOTE — [facility] Counselor (Signed)
CSW Attempt @ 2:15 pm, unable to get patient to response to questions

## 2024-01-01 NOTE — Progress Notes (Signed)
Patient is a 23 year old female who presented to [facility] from the [facility] under voluntary admission for complaints of increasing depression and suicidal ideation. Pt has a hx of Bipolar, ADHD, PTSD, and an intentional OD. Pt denied SI/HI and A/VH, and did not appear to be responding to internal stimuli. Pt presented fidgety, but very tired ( intermittently laying her head down on the table) and did not forward much information,becoming irritable when asked questions, stating that she just wanted to eat and go to bed. Pt was unable to identify or name a stressor that led up to her admission. Pt denied alcohol and illicit drug use. VS monitored and recorded. Skin check performed with MHT. Admission paperwork completed and signed.  Pt's belongings secured in locker. Lunch and po fluids provided. Q 15 min checks initiated for safety.Marland Kitchen
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
## 2024-01-01 NOTE — ED Provider Notes (Signed)
FBC/OBS ASAP Discharge Summary  Date and Time: 01/01/2024 10:16 AM  Name: Tamara Guzman  MRN:  [id number]   Discharge Diagnoses:  Final diagnoses:  MDD (major depressive disorder), recurrent episode, moderate (HCC)   HPI: Tamara Guzman is a 23 y/o single female with a history of Bipolar Disorder, ADHD, PTSD, intentional drug overdose presenting to [facility] with worsening depression symptoms and suicidal ideation for the past two months.    Patient recommended for IP admission.   Patient seen face-to-face by this provider, chart reviewed, and case consulted with Dr. Toula Moos on 01/01/2024.  Subjective:   Upon assessment patient is laying in her bed asleep.  She is easily awakened.  She is less irritable today upon approach.  She continues to endorse depression and has a depressed affect.  She continues to endorse suicidal ideations and cannot fully contract for safety.  She denies any current intent or plan.  She denies HI/AVH.  She does not appear to be responding to internal/external stimuli.  Discussed inpatient psychiatric admission and patient continues to be in agreement.  She remains voluntary at this time.  Stay Summary:   Patient recommended for inpatient psychiatric admission.  She has been accepted to [facility].  Total Time spent with patient: 20 minutes  Past Psychiatric History: see H&P Past Medical History: see H&P Family History: see H&P Family Psychiatric History: see H&P Social History: see H&P Tobacco Cessation:  Prescription not provided because: pt transferred to [facility]  Current Medications:  Current Facility-Administered Medications  Medication Dose Route Frequency Provider Last Rate Last Admin   acetaminophen (TYLENOL) tablet 650 mg  650 mg Oral Q6H PRN Bobbitt, Shalon E, NP       alum & mag hydroxide-simeth (MAALOX/MYLANTA) 200-200-20 MG/5ML suspension 30 mL  30 mL Oral Q4H PRN Bobbitt, Shalon E, NP       ARIPiprazole (ABILIFY) tablet 5 mg  5 mg  Oral Daily Bobbitt, Shalon E, NP   5 mg at 01/01/24 0944   guaiFENesin (MUCINEX) 12 hr tablet 600 mg  600 mg Oral BID PRN Ardis Hughs, NP       hydrOXYzine (ATARAX) tablet 25 mg  25 mg Oral TID PRN Bobbitt, Shalon E, NP       magnesium hydroxide (MILK OF MAGNESIA) suspension 30 mL  30 mL Oral Daily PRN Bobbitt, Shalon E, NP       traZODone (DESYREL) tablet 50 mg  50 mg Oral QHS PRN Bobbitt, Shalon E, NP   50 mg at 12/31/23 2133   No current outpatient medications on file.    PTA Medications:  Facility Ordered Medications  Medication   acetaminophen (TYLENOL) tablet 650 mg   alum & mag hydroxide-simeth (MAALOX/MYLANTA) 200-200-20 MG/5ML suspension 30 mL   magnesium hydroxide (MILK OF MAGNESIA) suspension 30 mL   hydrOXYzine (ATARAX) tablet 25 mg   traZODone (DESYREL) tablet 50 mg   ARIPiprazole (ABILIFY) tablet 5 mg   [COMPLETED] dextromethorphan-guaiFENesin (MUCINEX DM) 30-600 MG per 12 hr tablet 1 tablet   guaiFENesin (MUCINEX) 12 hr tablet 600 mg       07/21/2022   10:47 AM 07/21/2022    5:34 AM 06/29/2022    3:08 PM  Depression screen PHQ 2/9  Decreased Interest 3 2 0  Down, Depressed, Hopeless 0 2 0  PHQ - 2 Score 3 4 0  Altered sleeping 0 1 0  Tired, decreased energy 2 1 0  Change in appetite 0 1 0  Feeling  bad or failure about yourself  1 1 0  Trouble concentrating 0 1 1  Moving slowly or fidgety/restless 0 1 0  Suicidal thoughts  1 0  PHQ-9 Score 6 11 1   Difficult doing work/chores  Somewhat difficult Somewhat difficult    Flowsheet Row ED from 12/30/2023 in [facility] ED from [facility]/20[facility] ED from 03/22/2023 in [facility]  C-SSRS RISK CATEGORY High Risk No Risk High Risk       Musculoskeletal  Strength & Muscle Tone: within normal limits Gait & Station: normal Patient leans: N/A  Psychiatric Specialty Exam  Presentation  General Appearance:   Appropriate for Environment; Casual  Eye Contact: Fair  Speech: Clear and Coherent; Normal Rate  Speech Volume: Normal  Handedness: Right   Mood and Affect  Mood: Anxious; Depressed  Affect: Congruent   Thought Process  Thought Processes: Coherent  Descriptions of Associations:Intact  Orientation:Full (Time, Place and Person)  Thought Content:Logical  Diagnosis of Schizophrenia or Schizoaffective disorder in past: No  Duration of Psychotic Symptoms: Greater than six months   Hallucinations:Hallucinations: None  Ideas of Reference:None  Suicidal Thoughts:Suicidal Thoughts: Yes, Passive SI Passive Intent and/or Plan: Without Intent; Without Plan; Without Means to Carry Out  Homicidal Thoughts:Homicidal Thoughts: No   Sensorium  Memory: Immediate Good; Recent Good; Remote Good  Judgment: Fair  Insight: Fair   Chartered certified accountant: Fair  Attention Span: Fair  Recall: [employer] of Knowledge: Fair  Language: Fair   Psychomotor Activity  Psychomotor Activity: Psychomotor Activity: Normal   Assets  Assets: Manufacturing systems engineer; Desire for Improvement; Physical Health; Resilience   Sleep  Sleep: Sleep: Fair Number of Hours of Sleep: 5   Nutritional Assessment (For OBS and FBC admissions only) Has the patient had a weight loss or gain of 10 pounds or more in the last 3 months?: No Has the patient had a decrease in food intake/or appetite?: No Does the patient have dental problems?: No Does the patient have eating habits or behaviors that may be indicators of an eating disorder including binging or inducing vomiting?: No Has the patient recently lost weight without trying?: 0 Has the patient been eating poorly because of a decreased appetite?: 0 Malnutrition Screening Tool Score: 0    Physical Exam  Physical Exam Vitals and nursing note reviewed.  Constitutional:      General: She is not in acute distress.     Appearance: Normal appearance. She is not ill-appearing.  Eyes:     General:        Right eye: No discharge.        Left eye: No discharge.  Cardiovascular:     Rate and Rhythm: Normal rate.  Pulmonary:     Effort: Pulmonary effort is normal. No respiratory distress.  Musculoskeletal:        General: Normal range of motion.     Cervical back: Normal range of motion.  Skin:    Coloration: Skin is not jaundiced or pale.  Neurological:     Mental Status: She is alert and oriented to person, place, and time.  Psychiatric:        Attention and Perception: Attention and perception normal.        Mood and Affect: Affect normal. Mood is anxious and depressed.        Speech: Speech normal.        Behavior: Behavior is cooperative.  Thought Content: Thought content includes suicidal ideation.        Cognition and Memory: Cognition normal.        Judgment: Judgment normal.    Review of Systems  Constitutional:  Negative for chills and fever.  HENT:  Negative for hearing loss.   Respiratory:  Negative for cough and shortness of breath.   Cardiovascular:  Negative for chest pain.  Gastrointestinal:  Negative for nausea and vomiting.  Musculoskeletal: Negative.   Neurological:  Negative for tremors and seizures.  Psychiatric/Behavioral:  Positive for depression and suicidal ideas. The patient is nervous/anxious.    Blood pressure 114/75, pulse (!) 102, temperature 99.6 F (37.6 C), temperature source Oral, resp. rate 16, SpO2 97%. There is no height or weight on file to calculate BMI.    Disposition:   Discharge and transfer patient to [facility] for inpatient psychiatric admission.  Dr. Harrold Donath Massingill the accepting MD  Continue Abilify 5 mg daily  Admission orders placed  Ardis Hughs, NP 01/01/2024, 10:16 AM

## 2024-01-01 NOTE — BHH Group Notes (Signed)
Psychoeducational Group Note  Date:  01/01/2024 Time:  2000  Group Topic/Focus:  Wrap up group  Participation Level: Did Not Attend  Participation Quality:  Not Applicable  Affect:  Not Applicable  Cognitive:  Not Applicable  Insight:  Not Applicable  Engagement in Group: Not Applicable  Additional Comments:  Did not attend.   Johann Capers S 01/01/2024, 10:10 PM

## 2024-01-01 NOTE — Progress Notes (Signed)
   01/01/24 2133  Psych Admission Type (Psych Patients Only)  Admission Status Voluntary  Psychosocial Assessment  Patient Complaints Depression  Eye Contact Brief  Facial Expression Flat  Affect Appropriate to circumstance  Speech Logical/coherent  Interaction Assertive  Motor Activity Other (Comment) (WDL)  Appearance/Hygiene Unremarkable  Behavior Characteristics Appropriate to situation  Mood Depressed  Thought Process  Coherency WDL  Content WDL  Delusions None reported or observed  Perception WDL  Hallucination None reported or observed  Judgment Impaired  Confusion None  Danger to Self  Current suicidal ideation? Denies  Agreement Not to Harm Self Yes  Description of Agreement verbal  Danger to Others  Danger to Others None reported or observed

## 2024-01-01 NOTE — ED Notes (Signed)
 Patient resting in lounger with eyes closed, respirations even and unlabored. Patient in no apparent acute distress. Environment secured. Safety checks in place per facility protocol.

## 2024-01-01 NOTE — Tx Team (Signed)
Initial Treatment Plan 01/01/2024 1:47 PM Tamara Guzman ZOX:[id number]    PATIENT STRESSORS: Financial difficulties   Medication change or noncompliance   Other: relationship issues     PATIENT STRENGTHS: Average or above average intelligence  Communication skills  Motivation for treatment/growth  Physical Health    PATIENT IDENTIFIED PROBLEMS: Suicidal ideation    insomnia    Medication non compliance             DISCHARGE CRITERIA:  Improved stabilization in mood, thinking, and/or behavior Need for constant or close observation no longer present Verbal commitment to aftercare and medication compliance  PRELIMINARY DISCHARGE PLAN: Attend aftercare/continuing care group Outpatient therapy Return to previous living arrangement  PATIENT/FAMILY INVOLVEMENT: This treatment plan has been presented to and reviewed with the patient, Tamara Guzman,  The patient has been given the opportunity to ask questions and make suggestions.  Shela Nevin, RN 01/01/2024, 1:47 PM

## 2024-01-01 NOTE — ED Notes (Signed)
Patient alert & oriented x4. Denies intent to harm self or others when asked. Denies A/VH. Patient denies any physical complaints when asked. Patient slightly irritable upon initial interaction as Clinical research associate woke patient up temporarily to administered scheduled medication. Patient was pleasant after sitting up. Scheduled medication administered with no complications. No acute distress noted. Support and encouragement provided. Routine safety checks conducted per facility protocol. Encouraged patient to notify staff if any thoughts of harm towards self or others arise. Patient verbalizes understanding and agreement.

## 2024-01-01 NOTE — ED Notes (Signed)
Attempted to call report. Was informed RN was busy and would have to call back. Writer left name and call back number.

## 2024-01-01 NOTE — Discharge Instructions (Signed)
Transfer patient to [facility] for inpatient admission, Dr. Phineas Inches is the accepting MD
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
## 2024-01-01 NOTE — ED Notes (Signed)
Patient transferred to [facility]. Belongings returned complete and intact. Staff walked patient to back [place] for transport. Safety maintained.

## 2024-01-01 NOTE — Plan of Care (Signed)
?  Problem: Education: ?Goal: Knowledge of [place] [facility] Education information/materials will improve ?Outcome: Progressing ?Goal: Emotional status will improve ?Outcome: Progressing ?  ?Problem: Activity: ?Goal: Interest or engagement in activities will improve ?Outcome: Progressing ?  ?

## 2024-01-02 ENCOUNTER — Encounter (HOSPITAL_COMMUNITY): Payer: Self-pay

## 2024-01-02 MED ORDER — ARIPIPRAZOLE 15 MG PO TABS
15.0000 mg | ORAL_TABLET | Freq: Every day | ORAL | Status: DC
  Administered 2024-01-03 – 2024-01-04 (×2): 15 mg via ORAL
  Filled 2024-01-02 (×5): qty 1

## 2024-01-02 MED ORDER — ARIPIPRAZOLE 5 MG PO TABS
5.0000 mg | ORAL_TABLET | Freq: Once | ORAL | Status: AC
Start: 2024-01-02 — End: 2024-01-02
  Administered 2024-01-02: 5 mg via ORAL
  Filled 2024-01-02 (×2): qty 1

## 2024-01-02 MED ORDER — HYDROXYZINE HCL 25 MG PO TABS
25.0000 mg | ORAL_TABLET | Freq: Three times a day (TID) | ORAL | Status: DC
  Administered 2024-01-02 – 2024-01-03 (×4): 25 mg via ORAL
  Filled 2024-01-02 (×15): qty 1

## 2024-01-02 NOTE — BHH Counselor (Addendum)
CSW Attempt PSA@ 09:06   CSW acknowledged patient and explained her role. Pt verbalized agitation "I don't feel good, my stomach is cramping". CSW informed patient that another CSW will come by later.    Steffanie Dunn  LCSWA 01/02/2024

## 2024-01-02 NOTE — Progress Notes (Signed)
D:  Patient has continued to lay in bed this morning.  No attempt made to get out of bed, attend groups, etc.   A:  Patient did take morning meds. R:  Patient denied SI and HI.   Denied A/V hallucinations.  Safety checks every 15 minutes.  Patient was eating lunch in bed.  Nurse attended to talk to patient who said "Oh Lord",  Rolled her head and shoulders.

## 2024-01-02 NOTE — Group Note (Signed)
Recreation Therapy Group Note   Group Topic:Communication  Group Date: 01/02/2024 Start Time: 0933 End Time: 0955 Facilitators: Drishti Pepperman-McCall, LRT,CTRS Location: [address] Dayroom   Group Topic: Communication, Problem Solving   Goal Area(s) Addresses:  Patient will effectively listen to complete activity.  Patient will identify communication skills used to make activity successful.  Patient will identify how skills used during activity can be used to reach post d/c goals.    Intervention: Building surveyor Activity - Geometric pattern cards, pencils, blank paper    Activity: Geometric Drawings.  Three volunteers from the peer group will be shown an abstract picture with a particular arrangement of geometrical shapes.  Each round, one 'speaker' will describe the pattern, as accurately as possible without revealing the image to the group.  The remaining group members will listen and draw the picture to reflect how it is described to them. Patients with the role of 'listener' cannot ask clarifying questions but, may request that the speaker repeat a direction. Once the drawings are complete, the presenter will show the rest of the group the picture and compare how close each person came to drawing the picture. LRT will facilitate a post-activity discussion regarding effective communication and the importance of planning, listening, and asking for clarification in daily interactions with others.  Education: Environmental consultant, Active listening, Support systems, Discharge planning  Education Outcome: Acknowledges understanding/In group clarification offered/Needs additional education.    Affect/Mood: Appropriate   Participation Level: Engaged   Participation Quality: Independent   Behavior: Appropriate   Speech/Thought Process: Focused   Insight: Good   Judgement: Good   Modes of Intervention: Activity   Patient Response to Interventions:  Engaged   Education  Outcome:  In group clarification offered    Clinical Observations/Individualized Feedback: Pt came in late to group. Once instructions were explained to pt, pt appeared confused at what to do. Pt got involved with the activity and followed along. Pt expressed frustration throughout activity but still tried to complete the drawings presented. Pt stated during processing the activity "makes you follow instructions".     Plan: Continue to engage patient in RT group sessions 2-3x/week.   Tihanna Goodson-McCall, LRT,CTRS 01/02/2024 12:13 PM

## 2024-01-02 NOTE — Progress Notes (Addendum)
[facility] MD Progress Note  01/02/2024 10:40 AM Tamara Guzman  MRN:  [id number]  Principal Problem: Bipolar I disorder, most recent episode (or current) manic, moderate with mixed features (HCC) Diagnosis: Principal Problem:   Bipolar I disorder, most recent episode (or current) manic, moderate with mixed features (HCC) Active Problems:   Post traumatic stress disorder (PTSD)   Substance use disorder   Reason for Admission:  Tamara Guzman is a 23 yo patient with a PPH of bipolar disorder, GAD, PTSD, ADHD, 1 prior suicide attempt and cannabis use disorder who was previously served by [employer] and presented to [facility] with worsening SI over the last 2 months.  Patient has no pertinent PMH.  Patient endorses no current medication regimen for psychiatric diagnoses.  (admitted on 01/01/2024, total  LOS: 1 day )   Yesterday, the psychiatry team made following recommendations: Increase Abilify 5 mg ? 10 mg.   Pertinent information discussed during bed progression: Patient irritable and would not get out of bed.  Interval events: No events overnight.  Slightly tachy to 107 and hypotensive to 99/59.  Vital send unremarkable.  No new labs.  No medication refusals.  No PRNs.   On interview with attending present, patient said primary goal is to get on medication, as she recognizes the need for it.  Patient is largely attentive to interview, although is occasionally distracted.  Only medication side effect is sleepiness from Abilify, is amenable to taking it at night and to increase to more effective dose.  Discussed suitability of LAI -- patient open to this.  Notes that she is sometimes triggered by boyfriend, including prior to admission.  Mood is "all right" since arriving to the hospital, has been helpful to get away from triggers.  Denied current anxiety.  Describes having anger at others, but that this is "getting better."  Patient sleep is "good", has been having difficulty with it over the past  few weeks.  Sleeps 4 to 5 hours a night.  PRN trazodone helped.  Encouraged patient to seek out as needed trazodone again if she continues to have difficulty.  She attributes insomnia to "trouble shutting down my mind."  Denies nightmares.  Primary difficulty staying asleep, and less sleep initiation.  Denies medical issues.  Does not work, currently on disability for "depression".  Graduated high school, no special education needs.  "Would have graduated early" if not for behavioral regulation issues.  Lives in [place] with mother.  Notes improving cough, but denies other symptomatology including: Subjective fever, chills, nausea, vomiting, shortness of breath.  No other somatic symptoms.  Denied active and passive suicidality, homicidal ideation, and auditory/visual hallucinations.  Past Psychiatric History:   Inpatient:  2021 at [facility] on child unit diagnoses MDD after intentional overdose, 2019 [facility]-MD[facility] Patient reports more than 3 hospitalizations however, unable to determine this Outpatient: Previously a part of [facility]?, none currently Therapist: Previously, none currently Previous medications: Latuda, clonidine, lithium, Abilify, Zoloft, Zyprexa, Seroquel Denies ever taking Depakote, Tegretol, Trileptal, Effexor, Cymbalta, Geodon, Prozac, Celexa 1 previous suicide attempt via intentional overdose on ciprofloxacin Latuda and clonidine No history of self-harm Does not meet criteria for eating disorder history  Family Psychiatric History:   Patient denies.   Social History:    Unemployed Lives with mom Has a 5-year-old who is at their father, but she sees them "enough"  Substance Abuse History in the last 12 months:  Yes.   THC: "A lot", patient reports last use was prior to getting  on the bus to come to [facility].  However UDS is negative for THC and positive for cocaine.  Patient endorses she does not normally do cocaine and thought she was doing THC, reports states blunt was from a  dealer. Cocaine: Denies EtOH: Denies Cigarettes: 1 PPD Denies other illicit substances  Past Medical History:  Past Medical History:  Diagnosis Date   ADHD    Anxiety    Bipolar 1 disorder (HCC)    Depression    Obesity    Family History:  Family History  Problem Relation Age of Onset   Hypertension Mother    Diabetes Mother    Healthy Father     Current Medications: Current Facility-Administered Medications  Medication Dose Route Frequency Provider Last Rate Last Admin   acetaminophen (TYLENOL) tablet 650 mg  650 mg Oral Q6H PRN Ardis Hughs, NP       alum & mag hydroxide-simeth (MAALOX/MYLANTA) 200-200-20 MG/5ML suspension 30 mL  30 mL Oral Q4H PRN Ardis Hughs, NP       ARIPiprazole (ABILIFY) tablet 10 mg  10 mg Oral Daily Eliseo Gum B, MD   10 mg at 01/02/24 1610   haloperidol (HALDOL) tablet 5 mg  5 mg Oral TID PRN Ardis Hughs, NP       And   diphenhydrAMINE (BENADRYL) capsule 50 mg  50 mg Oral TID PRN Ardis Hughs, NP       haloperidol lactate (HALDOL) injection 5 mg  5 mg Intramuscular TID PRN Ardis Hughs, NP       And   diphenhydrAMINE (BENADRYL) injection 50 mg  50 mg Intramuscular TID PRN Ardis Hughs, NP       And   LORazepam (ATIVAN) injection 2 mg  2 mg Intramuscular TID PRN Ardis Hughs, NP       haloperidol lactate (HALDOL) injection 10 mg  10 mg Intramuscular TID PRN Ardis Hughs, NP       And   diphenhydrAMINE (BENADRYL) injection 50 mg  50 mg Intramuscular TID PRN Ardis Hughs, NP       And   LORazepam (ATIVAN) injection 2 mg  2 mg Intramuscular TID PRN Ardis Hughs, NP       guaiFENesin (MUCINEX) 12 hr tablet 600 mg  600 mg Oral BID PRN Ardis Hughs, NP       hydrOXYzine (ATARAX) tablet 25 mg  25 mg Oral TID PRN Ardis Hughs, NP       magnesium hydroxide (MILK OF MAGNESIA) suspension 30 mL  30 mL Oral Daily PRN Ardis Hughs, NP       nicotine polacrilex (NICORETTE) gum 2  mg  2 mg Oral PRN Bobbye Morton, MD       traZODone (DESYREL) tablet 50 mg  50 mg Oral QHS PRN Ardis Hughs, NP        Lab Results:  Results for orders placed or performed during the hospital encounter of 12/30/23 (from the past 48 hours)  POCT Urine Drug Screen - (I-Screen)     Status: Abnormal   Collection Time: 01/01/24 10:47 AM  Result Value Ref Range   POC Amphetamine UR None Detected NONE DETECTED (Cut Off Level 1000 ng/mL)   POC Secobarbital (BAR) None Detected NONE DETECTED (Cut Off Level 300 ng/mL)   POC Buprenorphine (BUP) None Detected NONE DETECTED (Cut Off Level 10 ng/mL)   POC Oxazepam (BZO) None Detected NONE DETECTED (Cut Off Level  300 ng/mL)   POC Cocaine UR Positive (A) NONE DETECTED (Cut Off Level 300 ng/mL)   POC Methamphetamine UR None Detected NONE DETECTED (Cut Off Level 1000 ng/mL)   POC Morphine None Detected NONE DETECTED (Cut Off Level 300 ng/mL)   POC Methadone UR None Detected NONE DETECTED (Cut Off Level 300 ng/mL)   POC Oxycodone UR None Detected NONE DETECTED (Cut Off Level 100 ng/mL)   POC Marijuana UR None Detected NONE DETECTED (Cut Off Level 50 ng/mL)  Pregnancy, urine POC     Status: None   Collection Time: 01/01/24 10:48 AM  Result Value Ref Range   Preg Test, Ur NEGATIVE NEGATIVE    Comment:        THE SENSITIVITY OF THIS METHODOLOGY IS >24 mIU/mL     Blood Alcohol level:  Lab Results  Component Value Date   ETH <10 12/31/2023   ETH <10 04/05/2020    Metabolic Labs: Lab Results  Component Value Date   HGBA1C 5.2 12/31/2023   MPG 102.54 12/31/2023   MPG 91.06 06/27/2022   Lab Results  Component Value Date   PROLACTIN 1.5 (L) 07/09/2018   Lab Results  Component Value Date   CHOL 148 12/31/2023   TRIG 55 12/31/2023   HDL 51 12/31/2023   CHOLHDL 2.9 12/31/2023   VLDL 11 12/31/2023   LDLCALC 86 12/31/2023   LDLCALC 89 03/24/2023    Physical Findings: AIMS: No  CIWA:    COWS:     Psychiatric Specialty  Exam:  Presentation  General Appearance: Disheveled  Eye Contact:Fair  Speech:Clear and Coherent  Speech Volume:Normal  Handedness:Right   Mood and Affect  Mood:Labile; Euthymic  Affect:Congruent (Distractible.)   Thought Process  Thought Processes:Goal Directed  Descriptions of Associations:Intact  Orientation:Full (Time, Place and Person)  Thought Content:Logical  History of Schizophrenia/Schizoaffective disorder:No  Duration of Psychotic Symptoms:N/A  Hallucinations:Hallucinations: None  Ideas of Reference:None  Suicidal Thoughts: None   Homicidal Thoughts:Homicidal Thoughts: No   Sensorium  Memory:Immediate Fair; Recent Poor  Judgment:Other (comment) (Improving)  Insight:Other (comment) (Improving)   Executive Functions  Concentration:Fair  Attention Span:Poor  Recall:Fair  Fund of Knowledge:Fair  Language:Fair   Psychomotor Activity  Psychomotor Activity:Psychomotor Activity: Normal   Assets  Assets:Desire for Improvement; Resilience; Social Support   Sleep  Sleep:Sleep: Good (Okay since she has been here, has been sleeping for 4 or 5 hours at night recently)    Physical Exam: Physical Exam Constitutional:      Appearance: She is not ill-appearing.  Eyes:     Extraocular Movements: Extraocular movements intact.  Pulmonary:     Effort: Pulmonary effort is normal. No respiratory distress.  Musculoskeletal:        General: Normal range of motion.  Neurological:     Mental Status: She is alert.    Review of Systems  Constitutional:  Negative for chills and fever.  Respiratory:  Positive for cough. Negative for shortness of breath.   Cardiovascular:  Negative for chest pain.   Blood pressure 96/73, pulse 99, temperature 98.6 F (37 C), temperature source Oral, resp. rate 16, SpO2 100%. There is no height or weight on file to calculate BMI.  Treatment Plan Summary: Daily contact with patient to assess and evaluate  symptoms and progress in treatment and Medication management   ASSESSMENT: Diagnoses / Active Problems: Bipolar 1 disorder, most recent episode (recurrent) manic, moderate with mixed features Active problems: PTSD Cannabis use disorder UDS positive for cocaine  PLAN: Safety and Monitoring:  --  VOLUNTARY admission to inpatient psychiatric unit for safety, stabilization and treatment  -- Daily contact with patient to assess and evaluate symptoms and progress in treatment  -- Patient's case to be discussed in multi-disciplinary team meeting  -- Observation Level : q15 minute checks  -- Vital signs:  q12 hours  -- Precautions: suicide, elopement, and assault  2. Psychiatric Diagnoses and Treatment:   Bipolar 1 disorder, most recent episode (recurrent) manic, moderate with mixed features Cannabis Use Disorder UDS+ for cocaine   - Increase Abilify 10 mg ? 15 mg for current manic episode and mood stabilization.  Will receive 1x dose of Abilify 5 mg PM of 1/20. Consider Abilify LAI before D/C. - Schedule hydroxyzine 25mg  TID for anxiety. - Monitor for cocaine withdrawal symptoms. -- The risks/benefits/side-effects/alternatives to this medication were discussed in detail with the patient and time was given for questions. The patient consents to medication trial.              -- Metabolic profile and EKG monitoring obtained while on an atypical antipsychotic  BMI: 29.3 TSH: 1.189 Lipid Panel: 86 HbgA1c: 5.2 QTc: 439 as of 1/18             -- Encouraged patient to participate in unit milieu and in scheduled group therapies   -- Short Term Goals: Ability to identify changes in lifestyle to reduce recurrence of condition will improve, Ability to verbalize feelings will improve, Ability to disclose and discuss suicidal ideas, Ability to demonstrate self-control will improve, Ability to identify and develop effective coping behaviors will improve, Ability to maintain clinical measurements  within normal limits will improve, Compliance with prescribed medications will improve, and Ability to identify triggers associated with substance abuse/mental health issues will improve  -- Long Term Goals: Improvement in symptoms so as ready for discharge  Other PRNS:   - Tylenol 650mg  q6h, pain - Maalox 30ml q4h, indigestion  - Milk of Mag 30mL, constipation - Trazodone 50mg  QHS, insomnia  - Mucinex 12 hr tablet 600mg  BID PRN, cough - Nicorette gum 2mg  PRN   Haldol 5mg  TID PO PRN and Benadryl 50mg  PO TID  OR Haldol 10mg  TID IM PRN  and Benadryl 50mg  TID PRN IM and Ativan 2mg  IM TID PRN OR Haldol 5mg  IM TID PRN and Benadryl 50mg  IM TID PRN and Ativan 2mg  IM TID PRN    3. Medical Issues Being Addressed:   #Tobacco Use Disorder  - Nicotine gum order placed - Smoking cessation encouraged  # Hyponatremia - 132 as of 1/18 - Repeat BMP ordered AM 1/21.   4. Discharge Planning:   -- Social work and case management to assist with discharge planning and identification of hospital follow-up needs prior to discharge  -- Estimated Discharge Date: 5-7 days  -- Discharge Concerns: Need to establish a safety plan; Medication compliance and effectiveness  -- Discharge Goals: Return home with outpatient referrals for mental health follow-up including medication management/psychotherapy   I certify that inpatient services furnished can reasonably be expected to improve the patient's condition.   This note was created using a voice recognition software as a result there may be grammatical errors inadvertently enclosed that do not reflect the nature of this encounter. Every attempt is made to correct such errors.   Luiz Iron, MD PGY-1, Psychiatry Residency  1/20/202510:40 AM

## 2024-01-02 NOTE — Plan of Care (Signed)
  Problem: Education: Goal: Knowledge of  General Education information/materials will improve Outcome: Progressing   Problem: Education: Goal: Verbalization of understanding the information provided will improve Outcome: Progressing

## 2024-01-02 NOTE — Plan of Care (Signed)
  Problem: Education: Goal: Emotional status will improve Outcome: Not Progressing Goal: Mental status will improve Outcome: Not Progressing Goal: Verbalization of understanding the information provided will improve Outcome: Not Progressing  Pt irritable and not receptive to plan

## 2024-01-02 NOTE — Progress Notes (Signed)
   01/02/24 0539  15 Minute Checks  Location Bedroom  Visual Appearance Calm  Behavior Sleeping  Sleep (Behavioral Health Patients Only)  Calculate sleep? (Click Yes once per 24 hr at 0600 safety check) Yes  Documented sleep last 24 hours 10.75

## 2024-01-02 NOTE — BH IP Treatment Plan (Signed)
Interdisciplinary Treatment and Diagnostic Plan Update  01/02/2024 Time of Session: 11:05AM Chalene Cervenka MRN: [id number]  Principal Diagnosis: Bipolar I disorder, most recent episode (or current) manic, moderate with mixed features (HCC)  Secondary Diagnoses: Principal Problem:   Bipolar I disorder, most recent episode (or current) manic, moderate with mixed features (HCC) Active Problems:   Post traumatic stress disorder (PTSD)   Substance use disorder   Current Medications:  Current Facility-Administered Medications  Medication Dose Route Frequency Provider Last Rate Last Admin   acetaminophen (TYLENOL) tablet 650 mg  650 mg Oral Q6H PRN Ardis Hughs, NP       alum & mag hydroxide-simeth (MAALOX/MYLANTA) 200-200-20 MG/5ML suspension 30 mL  30 mL Oral Q4H PRN Ardis Hughs, NP       ARIPiprazole (ABILIFY) tablet 10 mg  10 mg Oral Daily Eliseo Gum B, MD   10 mg at 01/02/24 0814   [START ON 01/03/2024] ARIPiprazole (ABILIFY) tablet 15 mg  15 mg Oral QHS Tomie China, MD       ARIPiprazole (ABILIFY) tablet 5 mg  5 mg Oral Once Tomie China, MD       haloperidol (HALDOL) tablet 5 mg  5 mg Oral TID PRN Ardis Hughs, NP       And   diphenhydrAMINE (BENADRYL) capsule 50 mg  50 mg Oral TID PRN Ardis Hughs, NP       haloperidol lactate (HALDOL) injection 5 mg  5 mg Intramuscular TID PRN Ardis Hughs, NP       And   diphenhydrAMINE (BENADRYL) injection 50 mg  50 mg Intramuscular TID PRN Ardis Hughs, NP       And   LORazepam (ATIVAN) injection 2 mg  2 mg Intramuscular TID PRN Ardis Hughs, NP       haloperidol lactate (HALDOL) injection 10 mg  10 mg Intramuscular TID PRN Ardis Hughs, NP       And   diphenhydrAMINE (BENADRYL) injection 50 mg  50 mg Intramuscular TID PRN Ardis Hughs, NP       And   LORazepam (ATIVAN) injection 2 mg  2 mg Intramuscular TID PRN Ardis Hughs, NP       guaiFENesin (MUCINEX) 12 hr  tablet 600 mg  600 mg Oral BID PRN Ardis Hughs, NP       hydrOXYzine (ATARAX) tablet 25 mg  25 mg Oral TID Tomie China, MD       magnesium hydroxide (MILK OF MAGNESIA) suspension 30 mL  30 mL Oral Daily PRN Ardis Hughs, NP       nicotine polacrilex (NICORETTE) gum 2 mg  2 mg Oral PRN Bobbye Morton, MD       traZODone (DESYREL) tablet 50 mg  50 mg Oral QHS PRN Ardis Hughs, NP       PTA Medications: Medications Prior to Admission  Medication Sig Dispense Refill Last Dose/Taking   ARIPiprazole (ABILIFY) 5 MG tablet Take 1 tablet (5 mg total) by mouth daily.      guaiFENesin (MUCINEX) 600 MG 12 hr tablet Take 1 tablet (600 mg total) by mouth 2 (two) times daily as needed for cough.       Patient Stressors: Financial difficulties   Medication change or noncompliance   Other: relationship issues    Patient Strengths: Average or above average intelligence  Communication skills  Motivation for treatment/growth  Physical Health   Treatment Modalities: Medication Management, Group therapy,  Case management,  1 to 1 session with clinician, Psychoeducation, Recreational therapy.   Physician Treatment Plan for Primary Diagnosis: Bipolar I disorder, most recent episode (or current) manic, moderate with mixed features (HCC) Long Term Goal(s): Improvement in symptoms so as ready for discharge   Short Term Goals: Ability to identify changes in lifestyle to reduce recurrence of condition will improve Ability to verbalize feelings will improve Ability to disclose and discuss suicidal ideas Ability to demonstrate self-control will improve Ability to identify and develop effective coping behaviors will improve Ability to maintain clinical measurements within normal limits will improve Compliance with prescribed medications will improve Ability to identify triggers associated with substance abuse/mental health issues will improve  Medication Management: Evaluate patient's  response, side effects, and tolerance of medication regimen.  Therapeutic Interventions: 1 to 1 sessions, Unit Group sessions and Medication administration.  Evaluation of Outcomes: Not Progressing  Physician Treatment Plan for Secondary Diagnosis: Principal Problem:   Bipolar I disorder, most recent episode (or current) manic, moderate with mixed features (HCC) Active Problems:   Post traumatic stress disorder (PTSD)   Substance use disorder  Long Term Goal(s): Improvement in symptoms so as ready for discharge   Short Term Goals: Ability to identify changes in lifestyle to reduce recurrence of condition will improve Ability to verbalize feelings will improve Ability to disclose and discuss suicidal ideas Ability to demonstrate self-control will improve Ability to identify and develop effective coping behaviors will improve Ability to maintain clinical measurements within normal limits will improve Compliance with prescribed medications will improve Ability to identify triggers associated with substance abuse/mental health issues will improve     Medication Management: Evaluate patient's response, side effects, and tolerance of medication regimen.  Therapeutic Interventions: 1 to 1 sessions, Unit Group sessions and Medication administration.  Evaluation of Outcomes: Not Progressing   RN Treatment Plan for Primary Diagnosis: Bipolar I disorder, most recent episode (or current) manic, moderate with mixed features (HCC) Long Term Goal(s): Knowledge of disease and therapeutic regimen to maintain health will improve  Short Term Goals: Ability to remain free from injury will improve, Ability to verbalize frustration and anger appropriately will improve, Ability to demonstrate self-control, Ability to participate in decision making will improve, Ability to verbalize feelings will improve, Ability to disclose and discuss suicidal ideas, Ability to identify and develop effective coping  behaviors will improve, and Compliance with prescribed medications will improve  Medication Management: RN will administer medications as ordered by provider, will assess and evaluate patient's response and provide education to patient for prescribed medication. RN will report any adverse and/or side effects to prescribing provider.  Therapeutic Interventions: 1 on 1 counseling sessions, Psychoeducation, Medication administration, Evaluate responses to treatment, Monitor vital signs and CBGs as ordered, Perform/monitor CIWA, COWS, AIMS and Fall Risk screenings as ordered, Perform wound care treatments as ordered.  Evaluation of Outcomes: Not Progressing   LCSW Treatment Plan for Primary Diagnosis: Bipolar I disorder, most recent episode (or current) manic, moderate with mixed features (HCC) Long Term Goal(s): Safe transition to appropriate next level of care at discharge, Engage patient in therapeutic group addressing interpersonal concerns.  Short Term Goals: Engage patient in aftercare planning with referrals and resources, Increase social support, Increase ability to appropriately verbalize feelings, Increase emotional regulation, Facilitate acceptance of mental health diagnosis and concerns, Facilitate patient progression through stages of change regarding substance use diagnoses and concerns, Identify triggers associated with mental health/substance abuse issues, and Increase skills for wellness and recovery  Therapeutic  Interventions: Assess for all discharge needs, 1 to 1 time with Child psychotherapist, Explore available resources and support systems, Assess for adequacy in community support network, Educate family and significant other(s) on suicide prevention, Complete Psychosocial Assessment, Interpersonal group therapy.  Evaluation of Outcomes: Not Progressing   Progress in Treatment: Attending groups: No. Participating in groups: No. Taking medication as prescribed: Yes. Toleration  medication: Yes. Family/Significant other contact made: No, will contact:  consents pending Patient understands diagnosis: Yes. Discussing patient identified problems/goals with staff: Yes. Medical problems stabilized or resolved: Yes. Denies suicidal/homicidal ideation: Yes. Issues/concerns per patient self-inventory: No.  New problem(s) identified: No, Describe:  none  New Short Term/Long Term Goal(s):  medication stabilization, elimination of SI thoughts, development of comprehensive mental wellness plan.    Patient Goals:  "Get medications straight"  Discharge Plan or Barriers: Patient recently admitted. CSW will continue to follow and assess for appropriate referrals and possible discharge planning.    Reason for Continuation of Hospitalization: Depression Medication stabilization Suicidal ideation  Estimated Length of Stay: 5-7 days  Last 3 [place] Suicide Severity Risk Score: Flowsheet Row Admission (Current) [facility] from 12/30/2023 in [facility] ED from 05/06/2023 in [facility] at [facility]  C-SSRS RISK CATEGORY Low Risk High Risk No Risk       Last [facility] 2/9 Scores:    07/21/2022   10:47 AM 07/21/2022    5:34 AM 06/29/2022    3:08 PM  Depression screen PHQ 2/9  Decreased Interest 3 2 0  Down, Depressed, Hopeless 0 2 0  PHQ - 2 Score 3 4 0  Altered sleeping 0 1 0  Tired, decreased energy 2 1 0  Change in appetite 0 1 0  Feeling bad or failure about yourself  1 1 0  Trouble concentrating 0 1 1  Moving slowly or fidgety/restless 0 1 0  Suicidal thoughts  1 0  PHQ-9 Score 6 11 1   Difficult doing work/chores  Somewhat difficult Somewhat difficult    Scribe for Treatment Team: Kathi Der, LCSWA 01/02/2024 1:49 PM

## 2024-01-02 NOTE — Plan of Care (Signed)
Nurse discussed coping skills with patient.  

## 2024-01-03 LAB — BASIC METABOLIC PANEL
Anion gap: 9 (ref 5–15)
BUN: 12 mg/dL (ref 6–20)
CO2: 23 mmol/L (ref 22–32)
Calcium: 8.2 mg/dL — ABNORMAL LOW (ref 8.9–10.3)
Chloride: 100 mmol/L (ref 98–111)
Creatinine, Ser: 0.8 mg/dL (ref 0.44–1.00)
GFR, Estimated: >60 mL/min (ref >60)
Glucose, Bld: 134 mg/dL — ABNORMAL HIGH (ref 70–99)
Potassium: 3.9 mmol/L (ref 3.5–5.1)
Sodium: 132 mmol/L — ABNORMAL LOW (ref 135–145)

## 2024-01-03 LAB — FOLATE: Folate: 15.9 ng/mL (ref >5.9)

## 2024-01-03 LAB — VITAMIN B12: Vitamin B-12: 342 pg/mL (ref 180–914)

## 2024-01-03 LAB — RPR: RPR Ser Ql: NONREACTIVE

## 2024-01-03 LAB — VITAMIN D 25 HYDROXY (VIT D DEFICIENCY, FRACTURES): Vit D, 25-Hydroxy: 14.94 ng/mL — ABNORMAL LOW (ref 30–100)

## 2024-01-03 MED ORDER — VITAMIN D (ERGOCALCIFEROL) 1.25 MG (50000 UNIT) PO CAPS
50000.0000 [IU] | ORAL_CAPSULE | ORAL | Status: DC
  Administered 2024-01-03: 50000 [IU] via ORAL
  Filled 2024-01-03 (×2): qty 1

## 2024-01-03 MED ORDER — VITAMIN D3 25 MCG PO TABS
2000.0000 [IU] | ORAL_TABLET | Freq: Every day | ORAL | Status: DC
  Administered 2024-01-03: 2000 [IU] via ORAL
  Filled 2024-01-03 (×6): qty 2

## 2024-01-03 NOTE — Group Note (Signed)
Recreation Therapy Group Note   Group Topic:Animal Assisted Therapy   Group Date: 01/03/2024 Start Time: 5409 End Time: 1030 Facilitators: Esmeralda Blanford-McCall, LRT,CTRS Location: [address] Dayroom   Animal-Assisted Activity (AAA) Program Checklist/Progress Notes Patient Eligibility Criteria Checklist & Daily Group note for Rec Tx Intervention  AAA/T Program Assumption of Risk Form signed by Patient/ or Parent Legal Guardian Yes  Patient is free of allergies or severe asthma Yes  Patient reports no fear of animals Yes  Patient reports no history of cruelty to animals Yes  Patient understands his/her participation is voluntary Yes  Patient washes hands before animal contact Yes  Patient washes hands after animal contact Yes  Education: Hand Washing, Appropriate Animal Interaction   Education Outcome: Acknowledges education.    Affect/Mood: Tired   Participation Level: Active   Participation Quality: Independent   Behavior: Appropriate   Speech/Thought Process: Focused   Insight: Good   Judgement: Good   Modes of Intervention: Teaching laboratory technician   Patient Response to Interventions:  Attentive   Education Outcome:  In group clarification offered    Clinical Observations/Individualized Feedback: Pt appeared tired and sleepy. Pt had some interaction with therapy dog team and peers. Pt left early and did not return.    Plan: Continue to engage patient in RT group sessions 2-3x/week.   Chera Slivka-McCall, LRT,CTRS 01/03/2024 12:27 PM

## 2024-01-03 NOTE — Progress Notes (Signed)
[facility] MD Progress Note  01/03/2024 10:10 AM Tamara Guzman  MRN:  [id number]  Principal Problem: Bipolar I disorder, most recent episode (or current) manic, moderate with mixed features (HCC) Diagnosis: Principal Problem:   Bipolar I disorder, most recent episode (or current) manic, moderate with mixed features (HCC) Active Problems:   Post traumatic stress disorder (PTSD)   Substance use disorder   Reason for Admission:  Tamara Guzman is a 23 yo patient with a PPH of bipolar disorder, GAD, PTSD, ADHD, 1 prior suicide attempt and cannabis use disorder who was previously served by [employer] and presented to [facility] with worsening SI over the last 2 months.  Patient has no pertinent PMH.  Patient endorses no current medication regimen for psychiatric diagnoses.  (admitted on 01/01/2024, total  LOS: 2 days )   Yesterday, the psychiatry team made following recommendations: Increase Abilify 10 mg ? 15 mg   Pertinent information discussed during bed progression: Slept 8.75 hours.  Refused labs last night but received them this morning.  Interval events: No acute events overnight.  Vitals unremarkable.  Refused a.m. labs.  No medication refusals.  PRNs: Trazodone 50 mg x1.  On interview, patient appears to be improving on increased Abilify dose.  Patient is attentive and amenable to interview.  Describes mood as "okay" and that it is not "as swing-y" as it typically is.  Sleeping and eating well.  Notes that cough is "better" than yesterday.  No new somatic complaints.  Denies medication side effects.  Denies suicidal ideation, homicidal ideation, and auditory and visual hallucinations.  Remains amenable to long-acting injectable before DC.  Past Psychiatric History:   Inpatient:  2021 at [facility] on child unit diagnoses MDD after intentional overdose, 2019 [facility] Patient reports more than 3 hospitalizations however, unable to determine this Outpatient: Previously a part of [facility]?, none  currently Therapist: Previously, none currently Previous medications: Latuda, clonidine, lithium, Abilify, Zoloft, Zyprexa, Seroquel Denies ever taking Depakote, Tegretol, Trileptal, Effexor, Cymbalta, Geodon, Prozac, Celexa 1 previous suicide attempt via intentional overdose on ciprofloxacin Latuda and clonidine No history of self-harm Does not meet criteria for eating disorder history  Family Psychiatric History:   Patient denies.   Social History:    Unemployed Lives with mom Has a 71-year-old who is at their father, but she sees them "enough"  Substance Abuse History in the last 12 months:  Yes.   THC: "A lot", patient reports last use was prior to getting on the bus to come to [facility].  However UDS is negative for THC and positive for cocaine.  Patient endorses she does not normally do cocaine and thought she was doing THC, reports states blunt was from a dealer. Cocaine: Denies EtOH: Denies Cigarettes: 1 PPD Denies other illicit substances  Past Medical History:  Past Medical History:  Diagnosis Date   ADHD    Anxiety    Bipolar 1 disorder (HCC)    Depression    Obesity    Family History:  Family History  Problem Relation Age of Onset   Hypertension Mother    Diabetes Mother    Healthy Father     Current Medications: Current Facility-Administered Medications  Medication Dose Route Frequency Provider Last Rate Last Admin   acetaminophen (TYLENOL) tablet 650 mg  650 mg Oral Q6H PRN Ardis Hughs, NP       alum & mag hydroxide-simeth (MAALOX/MYLANTA) 200-200-20 MG/5ML suspension 30 mL  30 mL Oral Q4H PRN Ardis Hughs, NP  ARIPiprazole (ABILIFY) tablet 10 mg  10 mg Oral Daily Eliseo Gum B, MD   10 mg at 01/03/24 0750   ARIPiprazole (ABILIFY) tablet 15 mg  15 mg Oral QHS Tomie China, MD       haloperidol (HALDOL) tablet 5 mg  5 mg Oral TID PRN Ardis Hughs, NP       And   diphenhydrAMINE (BENADRYL) capsule 50 mg  50 mg Oral TID PRN  Ardis Hughs, NP       haloperidol lactate (HALDOL) injection 5 mg  5 mg Intramuscular TID PRN Ardis Hughs, NP       And   diphenhydrAMINE (BENADRYL) injection 50 mg  50 mg Intramuscular TID PRN Ardis Hughs, NP       And   LORazepam (ATIVAN) injection 2 mg  2 mg Intramuscular TID PRN Ardis Hughs, NP       haloperidol lactate (HALDOL) injection 10 mg  10 mg Intramuscular TID PRN Ardis Hughs, NP       And   diphenhydrAMINE (BENADRYL) injection 50 mg  50 mg Intramuscular TID PRN Ardis Hughs, NP       And   LORazepam (ATIVAN) injection 2 mg  2 mg Intramuscular TID PRN Ardis Hughs, NP       guaiFENesin (MUCINEX) 12 hr tablet 600 mg  600 mg Oral BID PRN Ardis Hughs, NP       hydrOXYzine (ATARAX) tablet 25 mg  25 mg Oral TID Tomie China, MD   25 mg at 01/03/24 0750   magnesium hydroxide (MILK OF MAGNESIA) suspension 30 mL  30 mL Oral Daily PRN Ardis Hughs, NP       nicotine polacrilex (NICORETTE) gum 2 mg  2 mg Oral PRN Bobbye Morton, MD       traZODone (DESYREL) tablet 50 mg  50 mg Oral QHS PRN Ardis Hughs, NP   50 mg at 01/02/24 2049    Lab Results:  Results for orders placed or performed during the hospital encounter of 01/01/24 (from the past 48 hours)  Vitamin B12     Status: None   Collection Time: 01/03/24  6:41 AM  Result Value Ref Range   Vitamin B-12 342 180 - 914 pg/mL    Comment: (NOTE) This assay is not validated for testing neonatal or myeloproliferative syndrome specimens for Vitamin B12 levels. Performed at [facility], [address], [place], [place] [address]   Folate     Status: None   Collection Time: 01/03/24  6:41 AM  Result Value Ref Range   Folate 15.9 >5.9 ng/mL    Comment: Performed at [facility], [address] [place], [place] [address]  VITAMIN D 25 Hydroxy (Vit-D Deficiency, Fractures)     Status: Abnormal   Collection Time: 01/03/24  6:41  AM  Result Value Ref Range   Vit D, 25-Hydroxy 14.94 (L) 30 - 100 ng/mL    Comment: (NOTE) Vitamin D deficiency has been defined by the Institute of Medicine  and an Endocrine Society practice guideline as a level of serum 25-OH  vitamin D less than 20 ng/mL (1,2). The Endocrine Society went on to  further define vitamin D insufficiency as a level between 21 and 29  ng/mL (2).  1. IOM (Institute of Medicine). 2010. Dietary reference intakes for  calcium and D. [place]: [employer]. 2. Holick MF, Binkley [place], Bischoff-Ferrari HA, et  al. Evaluation,  treatment, and prevention of vitamin D deficiency: an Endocrine  Society clinical practice guideline, JCEM. 2011 Jul; 96(7): 1911-30.  Performed at [facility], [address] [place], [place] [address]   Basic metabolic panel     Status: Abnormal   Collection Time: 01/03/24  6:41 AM  Result Value Ref Range   Sodium 132 (L) 135 - 145 mmol/L   Potassium 3.9 3.5 - 5.1 mmol/L   Chloride 100 98 - 111 mmol/L   CO2 23 22 - 32 mmol/L   Glucose, Bld 134 (H) 70 - 99 mg/dL    Comment: Glucose reference range applies only to samples taken after fasting for at least 8 hours.   BUN 12 6 - 20 mg/dL   Creatinine, Ser 4.78 0.44 - 1.00 mg/dL   Calcium 8.2 (L) 8.9 - 10.3 mg/dL   GFR, Estimated >29 >56 mL/min    Comment: (NOTE) Calculated using the CKD-EPI Creatinine Equation (2021)    Anion gap 9 5 - 15    Comment: Performed at [facility], [address] [place], [place] [address]    Blood Alcohol level:  Lab Results  Component Value Date   [facility] <10 12/31/2023   ETH <10 04/05/2020    Metabolic Labs: Lab Results  Component Value Date   HGBA1C 5.2 12/31/2023   MPG 102.54 12/31/2023   MPG 91.06 06/27/2022   Lab Results  Component Value Date   PROLACTIN 1.5 (L) 07/09/2018   Lab Results  Component Value Date   CHOL 148 12/31/2023   TRIG 55 12/31/2023   HDL 51 12/31/2023   CHOLHDL 2.9  12/31/2023   VLDL 11 12/31/2023   LDLCALC 86 12/31/2023   LDLCALC 89 03/24/2023    Physical Findings: AIMS: No  CIWA:    COWS:     Psychiatric Specialty Exam:  Presentation  General Appearance: Appropriate for Environment  Eye Contact:Fair  Speech:Clear and Coherent  Speech Volume:Normal  Handedness:Right   Mood and Affect  Mood:Euthymic  Affect:Congruent   Thought Process  Thought Processes:Goal Directed  Descriptions of Associations:Intact  Orientation:Full (Time, Place and Person)  Thought Content:Logical  History of Schizophrenia/Schizoaffective disorder:No  Duration of Psychotic Symptoms:N/A  Hallucinations:Hallucinations: None  Ideas of Reference:None  Suicidal Thoughts: None   Homicidal Thoughts:Homicidal Thoughts: No   Sensorium  Memory:Immediate Fair  Judgment:Fair  Insight:Fair   Executive Functions  Concentration:Fair  Attention Span:Poor  Recall:Fair  Fund of Knowledge:Fair  Language:Fair   Psychomotor Activity  Psychomotor Activity:Psychomotor Activity: Normal   Assets  Assets:Desire for Improvement; Resilience; Social Support   Sleep  Sleep:Sleep: Good Number of Hours of Sleep: 8.75    Physical Exam: Physical Exam Constitutional:      Appearance: She is not ill-appearing.  Eyes:     Extraocular Movements: Extraocular movements intact.  Pulmonary:     Effort: Pulmonary effort is normal. No respiratory distress.  Musculoskeletal:        General: Normal range of motion.  Neurological:     Mental Status: She is alert.    Review of Systems  Constitutional:  Negative for chills and fever.  Respiratory:  Positive for cough. Negative for shortness of breath.   Cardiovascular:  Negative for chest pain.   Blood pressure 102/72, pulse (!) 102, temperature 99.1 F (37.3 C), temperature source Oral, resp. rate 16, SpO2 100%. There is no height or weight on file to calculate BMI.  Treatment Plan  Summary: Daily contact with patient to assess and  evaluate symptoms and progress in treatment and Medication management   ASSESSMENT: Diagnoses / Active Problems: Bipolar 1 disorder, most recent episode (recurrent) manic, moderate with mixed features Active problems: PTSD Cannabis use disorder UDS positive for cocaine  PLAN: Safety and Monitoring:  --  VOLUNTARY admission to inpatient psychiatric unit for safety, stabilization and treatment  -- Daily contact with patient to assess and evaluate symptoms and progress in treatment  -- Patient's case to be discussed in multi-disciplinary team meeting  -- Observation Level : q15 minute checks  -- Vital signs:  q12 hours  -- Precautions: suicide, elopement, and assault  2. Psychiatric Diagnoses and Treatment:   Bipolar 1 disorder, most recent episode (recurrent) manic, moderate with mixed features Cannabis Use Disorder UDS+ for cocaine   - Continue Abilify 15 mg qhs for current manic episode and mood stabilization.  Will receive first 15 mg dose tonight.  Tentative plan for Abilify LAI 1/22 if tolerated. - Continue scheduled hydroxyzine 25mg  TID for anxiety. -- The risks/benefits/side-effects/alternatives to this medication were discussed in detail with the patient and time was given for questions. The patient consents to medication trial.              -- Metabolic profile and EKG monitoring obtained while on an atypical antipsychotic  BMI: 29.3 TSH: 1.189 Lipid Panel: 86 HbgA1c: 5.2 QTc: 439 as of 1/18             -- Encouraged patient to participate in unit milieu and in scheduled group therapies   -- Short Term Goals: Ability to identify changes in lifestyle to reduce recurrence of condition will improve, Ability to verbalize feelings will improve, Ability to disclose and discuss suicidal ideas, Ability to demonstrate self-control will improve, Ability to identify and develop effective coping behaviors will improve, Ability to  maintain clinical measurements within normal limits will improve, Compliance with prescribed medications will improve, and Ability to identify triggers associated with substance abuse/mental health issues will improve  -- Long Term Goals: Improvement in symptoms so as ready for discharge  Other PRNS:   - Tylenol 650mg  q6h, pain - Maalox 30ml q4h, indigestion  - Milk of Mag 30mL, constipation - Trazodone 50mg  QHS, insomnia  - Mucinex 12 hr tablet 600mg  BID PRN, cough - Nicorette gum 2mg  PRN   Haldol 5mg  TID PO PRN and Benadryl 50mg  PO TID  OR Haldol 10mg  TID IM PRN  and Benadryl 50mg  TID PRN IM and Ativan 2mg  IM TID PRN OR Haldol 5mg  IM TID PRN and Benadryl 50mg  IM TID PRN and Ativan 2mg  IM TID PRN    3. Medical Issues Being Addressed:   #Tobacco Use Disorder  - Nicotine gum order placed - Smoking cessation encouraged  # Hyponatremia - Sodium continues to be 132 - Patient asymptomatic, continue to assess for any clinical changes  #Hypovitaminosis D - Start ergocalciferol 50,000 units weekly - Start vitamin D3 2000 units daily  4. Discharge Planning:   -- Social work and case management to assist with discharge planning and identification of hospital follow-up needs prior to discharge  -- Estimated Discharge Date: 3-5 days  -- Discharge Concerns: Need to establish a safety plan; Medication compliance and effectiveness  -- Discharge Goals: Return home with outpatient referrals for mental health follow-up including medication management/psychotherapy   I certify that inpatient services furnished can reasonably be expected to improve the patient's condition.   This note was created using a voice recognition software as a result there may be grammatical  errors inadvertently enclosed that do not reflect the nature of this encounter. Every attempt is made to correct such errors.   Luiz Iron, MD PGY-1, Psychiatry Residency  1/21/202510:10 AM

## 2024-01-03 NOTE — Progress Notes (Addendum)
Pt denied SI/HI/AVH this morning. Pt rated her depression a 4/10, anxiety a 4/10. Pt has been irritable today and argumentative with staff. Pt refused afternoon dose of hydroxyzine reporting "I don't want no more medications. Ilsa Iha got me on too many meds its making me drowsy and shit". Pt agreed to take evening dose of hydroxyzine and scheduled vitamins. RN provided support and encouragement to patient. Q15 min checks verified for safety. Patient verbally contracts for safety. Pt is safe on the unit.   01/03/24 0900  Psych Admission Type (Psych Patients Only)  Admission Status Voluntary  Psychosocial Assessment  Patient Complaints Isolation;Irritability;Agitation  Eye Contact Avoids  Affect Angry;Irritable  Speech Pressured  Interaction Demanding  Motor Activity Slow  Appearance/Hygiene Disheveled  Behavior Characteristics Agitated;Irritable  Mood Irritable  Thought Process  Coherency WDL  Content Blaming others  Delusions None reported or observed  Perception WDL  Hallucination None reported or observed  Judgment Impaired  Confusion None  Danger to Self  Current suicidal ideation? Denies  Description of Suicide Plan No plan  Agreement Not to Harm Self Yes  Description of Agreement Verbal  Danger to Others  Danger to Others None reported or observed

## 2024-01-03 NOTE — Group Note (Signed)
LCSW Group Therapy Note   Group Date: 01/03/2024 Start Time: 1100 End Time: 1200   Type of Therapy and Topic:  Group Therapy  Topic:  Shining from Within:  Confidence and Self-Love Journey  Participation:  did not attend  Objective: Promote Self-Awareness and Realistic Self-Talk: Help participants recognize their strengths and replace negative thoughts with truthful, realistic statements to build confidence.  Goals: Increase Confidence: Help participants develop a positive self-image by focusing on their strengths and personal progress. Set Achievable Goals: Guide participants in creating small, realistic goals that foster a sense of accomplishment and build momentum. Enhance Self-Care Practices: Encourage participants to incorporate self-care activities into their routine to support emotional well-being and reinforce confidence.  Summary: The "Shining from Within: Confidence and Self-Love Journey" group helped individuals build stronger self-confidence through truthful, realistic self-talk, goal-setting, and self-care practices. Participants recognized their unique strengths, set achievable goals, and nurtured a supportive environment for mutual growth. The group provided practical tools to foster lasting confidence and self-belief, empowering each member to shine from within and embrace their personal power with greater self-assurance.  Therapeutic Modalities: Elements of CBT Elements of DBT  Alla Feeling, LCSWA 01/03/2024  6:03 PM

## 2024-01-03 NOTE — Progress Notes (Signed)
Greeted pt in bed as she rested.  Pt was irritable and verbally agitated at first.  Pt stated that she was tired after her shower and staff kept "bothering her."  Informed pt of shift assessment and she denied SI/HI/AVH and stated she had a BM today.  Pt was more polite as meds were given and she was calm/cooperative at that time.  Pt got up for snack briefly then went back to bed to rest for the night.   01/02/24 2100  Psych Admission Type (Psych Patients Only)  Admission Status Voluntary  Psychosocial Assessment  Patient Complaints Isolation;Irritability;Agitation  Eye Contact Darting  Facial Expression Angry  Affect Angry;Irritable  Speech Logical/coherent;Pressured  Interaction Assertive;Avoidant;Demanding  Motor Activity  (UTA)  Appearance/Hygiene Other (Comment) (Room is maladorous, pt reported taking shower earlier)  Behavior Characteristics Unwilling to participate;Agitated  Mood Irritable  Thought Process  Coherency WDL  Content Blaming others  Delusions None reported or observed  Perception WDL  Hallucination None reported or observed  Judgment Impaired  Confusion None  Danger to Self  Current suicidal ideation? Denies  Agreement Not to Harm Self Yes  Description of Agreement Verbal  Danger to Others  Danger to Others None reported or observed

## 2024-01-03 NOTE — Plan of Care (Signed)
  Problem: Education: Goal: Emotional status will improve Outcome: Progressing Goal: Mental status will improve Outcome: Progressing   Problem: Activity: Goal: Interest or engagement in activities will improve Outcome: Progressing Goal: Sleeping patterns will improve Outcome: Progressing
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
## 2024-01-04 ENCOUNTER — Other Ambulatory Visit (HOSPITAL_COMMUNITY): Payer: Self-pay

## 2024-01-04 ENCOUNTER — Encounter (HOSPITAL_COMMUNITY): Payer: Self-pay | Admitting: Psychiatry

## 2024-01-04 DIAGNOSIS — F3112 Bipolar disorder, current episode manic without psychotic features, moderate: Secondary | ICD-10-CM | POA: Diagnosis not present

## 2024-01-04 MED ORDER — ARIPIPRAZOLE ER 400 MG IM SRER
400.0000 mg | Freq: Once | INTRAMUSCULAR | Status: AC
  Administered 2024-01-04: 400 mg via INTRAMUSCULAR

## 2024-01-04 NOTE — Group Note (Signed)
Recreation Therapy Group Note   Group Topic:Other  Group Date: 01/04/2024 Start Time: 1405 End Time: 1445 Facilitators: Izola Teague-McCall, LRT,CTRS Location: [address] Dayroom   Activity Description/Intervention: Therapeutic Drumming. Patients with peers and staff were given the opportunity to engage in a leader facilitated [facility] Group Empowerment Drumming Circle with staff from the [employer], in partnership with [employer]. Teaching laboratory technician and trained [employer], Theodoro Doing leading with LRT observing and documenting intervention and pt response. This evidenced-based practice targets 7 areas of health and wellbeing in the human experience including: stress-reduction, exercise, self-expression, camaraderie/support, nurturing, spirituality, and music-making (leisure).   Goal Area(s) Addresses:  Patient will engage in pro-social way in music group.  Patient will follow directions of drum leader on the first prompt. Patient will demonstrate no behavioral issues during group.  Patient will identify if a reduction in stress level occurs as a result of participation in therapeutic drum circle.    Education: Leisure exposure, Pharmacologist, Musical expression, Discharge Planning   Affect/Mood: Drowsy   Participation Level: Moderate   Participation Quality: Independent   Behavior: Sedated   Speech/Thought Process: Barely audible    Insight: None   Judgement: None   Modes of Intervention: Teaching laboratory technician   Patient Response to Interventions:  Receptive   Education Outcome:  In group clarification offered    Clinical Observations/Individualized Feedback: Pt appeared drowsy and sedated. Pt participated at times but would also have her head down at other moments. Pt was able to get through group session.   Plan: Continue to engage patient in RT group sessions 2-3x/week.   Maisa Bedingfield-McCall, LRT,CTRS  01/04/2024 3:08 PM

## 2024-01-04 NOTE — Group Note (Signed)
Date:  01/04/2024 Time:  4:48 PM  Group Topic/Focus:  Dimensions of Wellness:   The focus of this group is to introduce the topic of wellness and discuss the role each dimension of wellness plays in total health.    Participation Level:  Did Not Attend  Participation Quality:   n/a  Affect:   n/a  Cognitive:   n/a  Insight: None  Engagement in Group:   n/a  Modes of Intervention:   n/a  Additional Comments:   Pt did not attend.  Edmund Hilda Latonga Ponder 01/04/2024, 4:48 PM

## 2024-01-04 NOTE — Plan of Care (Signed)
  Problem: Safety: Goal: Periods of time without injury will increase Outcome: Progressing   

## 2024-01-04 NOTE — [facility] Group Notes (Signed)
[facility] Group Notes:  (Nursing/MHT/Case Management/Adjunct)  Date:  01/04/2024  Time:  9:19 PM  Type of Therapy:   NA/wrap-up group  Participation Level:  Did Not Attend  Participation Quality:    Affect:    Cognitive:    Insight:    Engagement in Group:    Modes of Intervention:    Summary of Progress/Problems: Pt refused to attend all groups offered. Writer provided pt with worksheet for box breathing.   Noah Delaine 01/04/2024, 9:19 PM

## 2024-01-04 NOTE — Progress Notes (Signed)
[facility] MD Progress Note  01/04/2024 1:08 PM Antionetta Humenik  MRN:  [id number]  Principal Problem: Bipolar I disorder, most recent episode (or current) manic, moderate with mixed features (HCC) Diagnosis: Principal Problem:   Bipolar I disorder, most recent episode (or current) manic, moderate with mixed features (HCC) Active Problems:   Post traumatic stress disorder (PTSD)   Substance use disorder   Reason for Admission:  Tamara Guzman is a 23 y.o. female who has a past medical history of ADHD, Bipolar 1 disorder (HCC), Borderline personality disorder (HCC), Depression, GAD (generalized anxiety disorder), H/O psychiatric hospitalization, History of medication noncompliance, Obesity, PTSD (post-traumatic stress disorder), and Suicide attempt (HCC). She presented for MDD (major depressive disorder), recurrent severe, without psychosis (HCC) [F33.2].   (admitted on 01/01/2024, total  LOS: 3 days )  The patient was discussed during morning huddle.  No acute events occurred overnight.  Staff noted the patient to be irritable.  Patient was seen in her room during rounds.  She continues to present as irritable, but she is actively engaged in the interview and denies suicidal or homicidal ideations.  She denies auditory or visual hallucinations.  She denies medication side effects.  Her energy and appetite are adequate.  She reports that interpersonal relationships are a big stressor in her life.  We discussed the benefits of Abilify maintainer versus oral Abilify, and the patient was agreeable to start the injection today.  Psychoeducation on the transition was provided in detail.  Patient expressed a desire to discharge from the hospital tomorrow, and as she does not appear to be dangerous to herself or others, and has a good treatment plan in place with follow-up being arranged by LCSW, I believe discharge tomorrow would be appropriate.   Past Psychiatric History:  Inpatient:  2021 at [facility] on  child unit diagnoses MDD after intentional overdose, 2019 [facility]-MDD Patient reports more than 3 hospitalizations however, unable to determine this Outpatient: Previously a part of [facility]?, none currently Therapist: Previously, none currently Previous medications: Latuda, clonidine, lithium, Abilify, Zoloft, Zyprexa, Seroquel Denies ever taking Depakote, Tegretol, Trileptal, Effexor, Cymbalta, Geodon, Prozac, Celexa 1 previous suicide attempt via intentional overdose on ciprofloxacin Latuda and clonidine No history of self-harm Does not meet criteria for eating disorder history  Family Psychiatric History:   Patient denies.   Social History:    Unemployed Lives with mom Has a 67-year-old who is at their father, but she sees them "enough"  Substance Abuse History in the last 12 months:  Yes.   THC: "A lot", patient reports last use was prior to getting on the bus to come to [facility].  However UDS is negative for THC and positive for cocaine.  Patient endorses she does not normally do cocaine and thought she was doing THC, reports states blunt was from a dealer. Cocaine: Denies EtOH: Denies Cigarettes: 1 PPD Denies other illicit substances  Past Medical History:  Past Medical History:  Diagnosis Date   ADHD    Bipolar 1 disorder (HCC)    Borderline personality disorder (HCC)    Depression    GAD (generalized anxiety disorder)    H/O psychiatric hospitalization    History of medication noncompliance    Obesity    PTSD (post-traumatic stress disorder)    Suicide attempt (HCC)    Family History:  Family History  Problem Relation Age of Onset   Hypertension Mother    Diabetes Mother    Healthy Father     Current Medications:  Current Facility-Administered Medications  Medication Dose Route Frequency Provider Last Rate Last Admin   acetaminophen (TYLENOL) tablet 650 mg  650 mg Oral Q6H PRN Ardis Hughs, NP       alum & mag hydroxide-simeth (MAALOX/MYLANTA) 200-200-20  MG/5ML suspension 30 mL  30 mL Oral Q4H PRN Ardis Hughs, NP       ARIPiprazole (ABILIFY) tablet 15 mg  15 mg Oral Devoria Glassing, MD   15 mg at 01/03/24 2116   ARIPiprazole ER (ABILIFY MAINTENA) injection 400 mg  400 mg Intramuscular Once Golda Acre, MD       haloperidol (HALDOL) tablet 5 mg  5 mg Oral TID PRN Ardis Hughs, NP       And   diphenhydrAMINE (BENADRYL) capsule 50 mg  50 mg Oral TID PRN Ardis Hughs, NP       haloperidol lactate (HALDOL) injection 5 mg  5 mg Intramuscular TID PRN Ardis Hughs, NP       And   diphenhydrAMINE (BENADRYL) injection 50 mg  50 mg Intramuscular TID PRN Ardis Hughs, NP       And   LORazepam (ATIVAN) injection 2 mg  2 mg Intramuscular TID PRN Ardis Hughs, NP       haloperidol lactate (HALDOL) injection 10 mg  10 mg Intramuscular TID PRN Ardis Hughs, NP       And   diphenhydrAMINE (BENADRYL) injection 50 mg  50 mg Intramuscular TID PRN Ardis Hughs, NP       And   LORazepam (ATIVAN) injection 2 mg  2 mg Intramuscular TID PRN Ardis Hughs, NP       guaiFENesin (MUCINEX) 12 hr tablet 600 mg  600 mg Oral BID PRN Ardis Hughs, NP       hydrOXYzine (ATARAX) tablet 25 mg  25 mg Oral TID Tomie China, MD   25 mg at 01/03/24 1632   magnesium hydroxide (MILK OF MAGNESIA) suspension 30 mL  30 mL Oral Daily PRN Ardis Hughs, NP       nicotine polacrilex (NICORETTE) gum 2 mg  2 mg Oral PRN Bobbye Morton, MD       traZODone (DESYREL) tablet 50 mg  50 mg Oral QHS PRN Ardis Hughs, NP   50 mg at 01/03/24 2116   Vitamin D (Ergocalciferol) (DRISDOL) 1.25 MG (50000 UNIT) capsule 50,000 Units  50,000 Units Oral Q7 days Tomie China, MD   50,000 Units at 01/03/24 1634   vitamin D3 (CHOLECALCIFEROL) tablet 2,000 Units  2,000 Units Oral Daily Tomie China, MD   2,000 Units at 01/03/24 1635    Lab Results:  Results for orders placed or performed during the hospital  encounter of 01/01/24 (from the past 48 hours)  Vitamin B12     Status: None   Collection Time: 01/03/24  6:41 AM  Result Value Ref Range   Vitamin B-12 342 180 - 914 pg/mL    Comment: (NOTE) This assay is not validated for testing neonatal or myeloproliferative syndrome specimens for Vitamin B12 levels. Performed at [facility], [address], [place], [place] [address]   Folate     Status: None   Collection Time: 01/03/24  6:41 AM  Result Value Ref Range   Folate 15.9 >5.9 ng/mL    Comment: Performed at [facility], [address], [place], [place] [address]  VITAMIN D 25 Hydroxy (Vit-D Deficiency, Fractures)  Status: Abnormal   Collection Time: 01/03/24  6:41 AM  Result Value Ref Range   Vit D, 25-Hydroxy 14.94 (L) 30 - 100 ng/mL    Comment: (NOTE) Vitamin D deficiency has been defined by the Institute of Medicine  and an Endocrine Society practice guideline as a level of serum 25-OH  vitamin D less than 20 ng/mL (1,2). The Endocrine Society went on to  further define vitamin D insufficiency as a level between 21 and 29  ng/mL (2).  1. IOM (Institute of Medicine). 2010. Dietary reference intakes for  calcium and D. [place]: [employer]. 2. Holick MF, Binkley [place], Bischoff-Ferrari HA, et al. Evaluation,  treatment, and prevention of vitamin D deficiency: an Endocrine  Society clinical practice guideline, JCEM. 2011 Jul; 96(7): 1911-30.  Performed at [facility], [address], [place], [place] [address]   RPR     Status: None   Collection Time: 01/03/24  6:41 AM  Result Value Ref Range   RPR Ser Ql NON REACTIVE NON REACTIVE    Comment: Performed at [facility], [address], [place], [place] [address]  Basic metabolic panel     Status: Abnormal   Collection Time: 01/03/24  6:41 AM  Result Value Ref Range   Sodium 132 (L) 135 - 145 mmol/L   Potassium 3.9 3.5 - 5.1 mmol/L   Chloride 100 98  - 111 mmol/L   CO2 23 22 - 32 mmol/L   Glucose, Bld 134 (H) 70 - 99 mg/dL    Comment: Glucose reference range applies only to samples taken after fasting for at least 8 hours.   BUN 12 6 - 20 mg/dL   Creatinine, Ser 3.08 0.44 - 1.00 mg/dL   Calcium 8.2 (L) 8.9 - 10.3 mg/dL   GFR, Estimated >65 >78 mL/min    Comment: (NOTE) Calculated using the CKD-EPI Creatinine Equation (2021)    Anion gap 9 5 - 15    Comment: Performed at [facility], [address] [place], [place] [address]    Blood Alcohol level:  Lab Results  Component Value Date   [facility] <10 12/31/2023   ETH <10 04/05/2020    Metabolic Labs: Lab Results  Component Value Date   HGBA1C 5.2 12/31/2023   MPG 102.54 12/31/2023   MPG 91.06 06/27/2022   Lab Results  Component Value Date   PROLACTIN 1.5 (L) 07/09/2018   Lab Results  Component Value Date   CHOL 148 12/31/2023   TRIG 55 12/31/2023   HDL 51 12/31/2023   CHOLHDL 2.9 12/31/2023   VLDL 11 12/31/2023   LDLCALC 86 12/31/2023   LDLCALC 89 03/24/2023    Physical Findings: AIMS: No  CIWA:    COWS:     Psychiatric Specialty Exam:  Presentation  General Appearance: Appropriate for Environment  Eye Contact:Good  Speech:Clear and Coherent; Normal Rate  Speech Volume:Normal  Handedness:Right   Mood and Affect  Mood:Irritable  Affect:Congruent; Full Range   Thought Process  Thought Processes:Linear  Descriptions of Associations:Intact  Orientation:Full (Time, Place and Person)  Thought Content:Logical  History of Schizophrenia/Schizoaffective disorder:No  Duration of Psychotic Symptoms:N/A  Hallucinations:Hallucinations: None  Ideas of Reference:None  Suicidal Thoughts: None   Homicidal Thoughts:Homicidal Thoughts: No   Sensorium  Memory:Immediate Good  Judgment:Poor  Insight:Fair   Executive Functions  Concentration:Good  Attention Span:Good  Recall:Good  Fund of  Knowledge:Good  Language:Good   Psychomotor Activity  Psychomotor Activity:Psychomotor Activity: Normal  Assets  Assets:Communication Skills; Social Support; Housing; Leisure Time   Sleep  Sleep:Sleep: Good Number of Hours of Sleep: 8.75    Physical Exam: Physical Exam Constitutional:      Appearance: She is not ill-appearing.  Eyes:     Extraocular Movements: Extraocular movements intact.  Pulmonary:     Effort: Pulmonary effort is normal. No respiratory distress.  Musculoskeletal:        General: Normal range of motion.  Neurological:     Mental Status: She is alert.    Review of Systems  Constitutional:  Negative for chills and fever.  Respiratory:  Positive for cough. Negative for shortness of breath.   Cardiovascular:  Negative for chest pain.   Blood pressure 103/61, pulse (!) 101, temperature 98.6 F (37 C), temperature source Oral, resp. rate 16, SpO2 98%. There is no height or weight on file to calculate BMI.  Treatment Plan Summary: Daily contact with patient to assess and evaluate symptoms and progress in treatment and Medication management   ASSESSMENT: Diagnoses / Active Problems: Bipolar 1 disorder, most recent episode (recurrent) manic, moderate with mixed features Active problems: PTSD Cannabis use disorder UDS positive for cocaine  PLAN: Safety and Monitoring:  --  VOLUNTARY admission to inpatient psychiatric unit for safety, stabilization and treatment  -- Daily contact with patient to assess and evaluate symptoms and progress in treatment  -- Patient's case to be discussed in multi-disciplinary team meeting  -- Observation Level : q15 minute checks  -- Vital signs:  q12 hours  -- Precautions: suicide, elopement, and assault  2. Psychiatric Diagnoses and Treatment:   Bipolar 1 disorder, most recent episode (recurrent) manic, moderate with mixed features Cannabis Use Disorder UDS+ for cocaine   -Administer Abilify Maintaina 400 mg  IM (1/22) - Continue Abilify 15 mg qhs for current manic episode and mood stabilization.   - Continue scheduled hydroxyzine 25mg  TID for anxiety. -- The risks/benefits/side-effects/alternatives to this medication were discussed in detail with the patient and time was given for questions. The patient consents to medication trial.              -- Metabolic profile and EKG monitoring obtained while on an atypical antipsychotic  BMI: 29.3 TSH: 1.189 Lipid Panel: 86 HbgA1c: 5.2 QTc: 439 as of 1/18             -- Encouraged patient to participate in unit milieu and in scheduled group therapies   -- Short Term Goals: Ability to identify changes in lifestyle to reduce recurrence of condition will improve, Ability to verbalize feelings will improve, Ability to disclose and discuss suicidal ideas, Ability to demonstrate self-control will improve, Ability to identify and develop effective coping behaviors will improve, Ability to maintain clinical measurements within normal limits will improve, Compliance with prescribed medications will improve, and Ability to identify triggers associated with substance abuse/mental health issues will improve  -- Long Term Goals: Improvement in symptoms so as ready for discharge  Other PRNS:   - Tylenol 650mg  q6h, pain - Maalox 30ml q4h, indigestion  - Milk of Mag 30mL, constipation - Trazodone 50mg  QHS, insomnia  - Mucinex 12 hr tablet 600mg  BID PRN, cough - Nicorette gum 2mg  PRN   Haldol 5mg  TID PO PRN and Benadryl 50mg  PO TID  OR Haldol 10mg  TID IM PRN  and Benadryl 50mg  TID PRN IM and Ativan 2mg  IM TID PRN OR Haldol 5mg  IM TID PRN and Benadryl 50mg  IM TID PRN and Ativan 2mg  IM TID  PRN    3. Medical Issues Being Addressed:   #Tobacco Use Disorder  - Nicotine gum order placed - Smoking cessation encouraged  # Hyponatremia - Sodium continues to be 132 - Patient asymptomatic, continue to assess for any clinical changes  #Hypovitaminosis D - Start  ergocalciferol 50,000 units weekly - Start vitamin D3 2000 units daily  4. Discharge Planning:   -- Social work and case management to assist with discharge planning and identification of hospital follow-up needs prior to discharge  -- Estimated Discharge Date: 3-5 days  -- Discharge Concerns: Need to establish a safety plan; Medication compliance and effectiveness  -- Discharge Goals: Return home with outpatient referrals for mental health follow-up including medication management/psychotherapy   I certify that inpatient services furnished can reasonably be expected to improve the patient's condition.    This note was created using a voice recognition software as a result there may be grammatical errors inadvertently enclosed that do not reflect the nature of this encounter. Every attempt is made to correct such errors.   Criss Alvine, MD Psychiatrist  1/22/20251:08 PM

## 2024-01-04 NOTE — [facility] Suicide Risk Assessment (Signed)
[facility] INPATIENT:  Family/Significant Other Suicide Prevention Education  Suicide Prevention Education:  2nd Contact Attempt: Horatio Pel- [phone]  , (name of family member/significant other) has been identified by the patient as the family member/significant other with whom the patient will be residing, and identified as the person(s) who will aid the patient in the event of a mental health crisis.  With written consent from the patient, two attempts were made to provide suicide prevention education, prior to and/or following the patient's discharge.  We were unsuccessful in providing suicide prevention education.  A suicide education pamphlet was given to the patient to share with family/significant other.  1st attempt 01/04/24 @1357   , 2nd attempt @1745   Steffanie Dunn 01/04/2024, 5:50 PM

## 2024-01-04 NOTE — Progress Notes (Signed)
   01/04/24 0554  15 Minute Checks  Location Bedroom  Visual Appearance Calm  Behavior Sleeping  Sleep (Behavioral Health Patients Only)  Calculate sleep? (Click Yes once per 24 hr at 0600 safety check) Yes  Documented sleep last 24 hours 10.5

## 2024-01-04 NOTE — Plan of Care (Signed)
Problem: Education: Goal: Knowledge of [facility] Education information/materials will improve Outcome: Progressing Goal: Emotional status will improve Outcome: Progressing Goal: Mental status will improve Outcome: Progressing Goal: Verbalization of understanding the information provided will improve Outcome: Progressing   Problem: Activity: Goal: Interest or engagement in activities will improve Outcome: Progressing Goal: Sleeping patterns will improve Outcome: Progressing   Problem: Coping: Goal: Ability to verbalize frustrations and anger appropriately will improve Outcome: Progressing Goal: Ability to demonstrate self-control will improve Outcome: Progressing   Problem: Health Behavior/Discharge Planning: Goal: Identification of resources available to assist in meeting health care needs will improve Outcome: Progressing Goal: Compliance with treatment plan for underlying cause of condition will improve Outcome: Progressing   Problem: Physical Regulation: Goal: Ability to maintain clinical measurements within normal limits will improve Outcome: Progressing   Problem: Safety: Goal: Periods of time without injury will increase Outcome: Progressing   Problem: Education: Goal: Knowledge of [facility] Education information/materials will improve Outcome: Progressing Goal: Emotional status will improve Outcome: Progressing Goal: Mental status will improve Outcome: Progressing Goal: Verbalization of understanding the information provided will improve Outcome: Progressing   Problem: Activity: Goal: Interest or engagement in activities will improve Outcome: Progressing Goal: Sleeping patterns will improve Outcome: Progressing   Problem: Coping: Goal: Ability to verbalize frustrations and anger appropriately will improve Outcome: Progressing Goal: Ability to demonstrate self-control will improve Outcome: Progressing   Problem: Health  Behavior/Discharge Planning: Goal: Identification of resources available to assist in meeting health care needs will improve Outcome: Progressing Goal: Compliance with treatment plan for underlying cause of condition will improve Outcome: Progressing   Problem: Physical Regulation: Goal: Ability to maintain clinical measurements within normal limits will improve Outcome: Progressing   Problem: Safety: Goal: Periods of time without injury will increase Outcome: Progressing   Problem: Education: Goal: Ability to make informed decisions regarding treatment will improve Outcome: Progressing   Problem: Coping: Goal: Coping ability will improve Outcome: Progressing   Problem: Health Behavior/Discharge Planning: Goal: Identification of resources available to assist in meeting health care needs will improve Outcome: Progressing   Problem: Medication: Goal: Compliance with prescribed medication regimen will improve Outcome: Progressing   Problem: Self-Concept: Goal: Ability to disclose and discuss suicidal ideas will improve Outcome: Progressing   Problem: Education: Goal: Utilization of techniques to improve thought processes will improve Outcome: Progressing Goal: Knowledge of the prescribed therapeutic regimen will improve Outcome: Progressing   Problem: Activity: Goal: Interest or engagement in leisure activities will improve Outcome: Progressing Goal: Imbalance in normal sleep/wake cycle will improve Outcome: Progressing   Problem: Coping: Goal: Coping ability will improve Outcome: Progressing Goal: Will verbalize feelings Outcome: Progressing   Problem: Health Behavior/Discharge Planning: Goal: Ability to make decisions will improve Outcome: Progressing Goal: Compliance with therapeutic regimen will improve Outcome: Progressing   Problem: Role Relationship: Goal: Will demonstrate positive changes in social behaviors and relationships Outcome: Progressing    Problem: Safety: Goal: Ability to disclose and discuss suicidal ideas will improve Outcome: Progressing Goal: Ability to identify and utilize support systems that promote safety will improve Outcome: Progressing   Problem: Self-Concept: Goal: Will verbalize positive feelings about self Outcome: Progressing Goal: Level of anxiety will decrease Outcome: Progressing   Problem: Education: Goal: Ability to state activities that reduce stress will improve Outcome: Progressing   Problem: Coping: Goal: Ability to identify and develop effective coping behavior will improve Outcome: Progressing   Problem: Self-Concept: Goal: Ability to identify factors that promote anxiety will improve Outcome: Progressing  Goal: Level of anxiety will decrease Outcome: Progressing Goal: Ability to modify response to factors that promote anxiety will improve Outcome: Progressing

## 2024-01-04 NOTE — Group Note (Signed)
Date:  01/04/2024 Time:  10:42 AM  Group Topic/Focus:  Goals Group:   The focus of this group is to help patients establish daily goals to achieve during treatment and discuss how the patient can incorporate goal setting into their daily lives to aide in recovery. Orientation:   The focus of this group is to educate the patient on the purpose and policies of crisis stabilization and provide a format to answer questions about their admission.  The group details unit policies and expectations of patients while admitted.    Participation Level:  Active  Participation Quality:  Appropriate  Affect:  Appropriate  Cognitive:  Appropriate  Insight: Appropriate  Engagement in Group:  Engaged  Modes of Intervention:  Discussion, Orientation, and Rapport Building  Additional Comments:   Pt attended and participated in the Orientation and Goals group. Pt personal goal for today is to work on strained familial relationships.   Tamara Guzman 01/04/2024, 10:42 AM

## 2024-01-04 NOTE — [facility] Suicide Risk Assessment (Signed)
[facility] INPATIENT:  Family/Significant Other Suicide Prevention Education  Suicide Prevention Education:  Contact Attempts: Horatio Pel- [phone] 1st attempt 01/04/24 @1357 , (name of family member/significant other) has been identified by the patient as the family member/significant other with whom the patient will be residing, and identified as the person(s) who will aid the patient in the event of a mental health crisis.  With written consent from the patient, two attempts were made to provide suicide prevention education, prior to and/or following the patient's discharge.  We were unsuccessful in providing suicide prevention education.  A suicide education pamphlet was given to the patient to share with family/significant other.  Date and time of first attempt:01/04/2024/@1357    Tamara Guzman LCSWA 01/04/2024, 1:58 PM

## 2024-01-04 NOTE — Progress Notes (Signed)
   01/03/24 2116  Psych Admission Type (Psych Patients Only)  Admission Status Voluntary  Psychosocial Assessment  Patient Complaints Isolation  Eye Contact Brief  Facial Expression Flat  Affect Irritable  Speech Logical/coherent  Interaction Assertive  Motor Activity Other (Comment) (WDL)  Appearance/Hygiene Disheveled  Behavior Characteristics Cooperative  Mood Suspicious  Thought Process  Coherency WDL  Content WDL  Delusions None reported or observed  Perception WDL  Hallucination None reported or observed  Judgment Impaired  Confusion None  Danger to Self  Current suicidal ideation? Denies  Agreement Not to Harm Self Yes  Description of Agreement verbal  Danger to Others  Danger to Others None reported or observed

## 2024-01-04 NOTE — Progress Notes (Signed)
   01/04/24 1636  Psych Admission Type (Psych Patients Only)  Admission Status Voluntary  Psychosocial Assessment  Patient Complaints Isolation  Eye Contact Fair  Facial Expression Anxious  Affect Appropriate to circumstance  Speech Logical/coherent  Interaction Assertive  Motor Activity Slow  Appearance/Hygiene Disheveled  Behavior Characteristics Cooperative  Mood Other (Comment) (Pt brightend up after lunch and was visable in milieu.  Bright affect)  Thought Process  Coherency WDL  Content WDL  Delusions None reported or observed  Perception WDL  Hallucination None reported or observed  Judgment Limited  Confusion None  Danger to Self  Current suicidal ideation? Denies  Agreement Not to Harm Self Yes  Description of Agreement verbal  Danger to Others  Danger to Others None reported or observed   Pt slept most of morning and appeared irritable.  After lunch pt was visible in milieu and acted appropriately.  Pt received her Abilify Maintena shot.Marland Kitchen

## 2024-01-05 ENCOUNTER — Other Ambulatory Visit: Payer: Self-pay

## 2024-01-05 ENCOUNTER — Other Ambulatory Visit (HOSPITAL_COMMUNITY): Payer: Self-pay

## 2024-01-05 DIAGNOSIS — F3112 Bipolar disorder, current episode manic without psychotic features, moderate: Secondary | ICD-10-CM

## 2024-01-05 MED ORDER — TRAZODONE HCL 50 MG PO TABS
50.0000 mg | ORAL_TABLET | Freq: Every evening | ORAL | 0 refills | Status: DC | PRN
  Filled 2024-01-05: qty 7, 7d supply, fill #0

## 2024-01-05 MED ORDER — ARIPIPRAZOLE 15 MG PO TABS
15.0000 mg | ORAL_TABLET | Freq: Every day | ORAL | 0 refills | Status: DC
  Filled 2024-01-05: qty 13, 13d supply, fill #0

## 2024-01-05 MED ORDER — VITAMIN D (ERGOCALCIFEROL) 1.25 MG (50000 UNIT) PO CAPS
50000.0000 [IU] | ORAL_CAPSULE | ORAL | 0 refills | Status: DC
  Filled 2024-01-05: qty 4, 28d supply, fill #0

## 2024-01-05 MED ORDER — HYDROXYZINE HCL 25 MG PO TABS
25.0000 mg | ORAL_TABLET | Freq: Three times a day (TID) | ORAL | 0 refills | Status: DC
  Filled 2024-01-05: qty 30, 10d supply, fill #0

## 2024-01-05 NOTE — Group Note (Signed)
[facility] LCSW Group Therapy Note   Group Date: 01/05/2024 Start Time: 1100 End Time: 1200   Type of Therapy/Topic:  Group Therapy:  Emotion Regulation  Participation Level:  Did Not Attend   Mood:n/a  Description of Group:    The purpose of this group is to assist patients in learning to regulate negative emotions and experience positive emotions. Patients will be guided to discuss ways in which they have been vulnerable to their negative emotions. These vulnerabilities will be juxtaposed with experiences of positive emotions or situations, and patients challenged to use positive emotions to combat negative ones. Special emphasis will be placed on coping with negative emotions in conflict situations, and patients will process healthy conflict resolution skills.  Therapeutic Goals: Patient will identify two positive emotions or experiences to reflect on in order to balance out negative emotions:  Patient will label two or more emotions that they find the most difficult to experience:  Patient will be able to demonstrate positive conflict resolution skills through discussion or role plays:   Summary of Patient Progress:   Pt did not attend, pt scheduled to discharge.    Therapeutic Modalities:   Cognitive Behavioral Therapy Feelings Identification Dialectical Behavioral Therapy   Steffanie Dunn, LCSW

## 2024-01-05 NOTE — Progress Notes (Signed)
Pt awake and alert.  Denies SI, HI or AVH.  She was given AVS, Suicide Risk assessment and , Discharge instructions including information to get medications from [facility] pharmacy and follow up instructions.  Pt verbalized understanding.   She was given all her belongings back from locker 41. She was escorted to lobby and left via taxi for home. Voucher given to cab driver.  No distress noted.

## 2024-01-05 NOTE — Group Note (Signed)
Date:  01/05/2024 Time:  10:10 AM  Group Topic/Focus:  Goals Group:   The focus of this group is to help patients establish daily goals to achieve during treatment and discuss how the patient can incorporate goal setting into their daily lives to aide in recovery. Orientation:   The focus of this group is to educate the patient on the purpose and policies of crisis stabilization and provide a format to answer questions about their admission.  The group details unit policies and expectations of patients while admitted.    Participation Level:  Did Not Attend  Additional Comments:  Patient was encouraged to attend group multiple times.   Zariah Jost T Dominico Rod 01/05/2024, 10:10 AM

## 2024-01-05 NOTE — [facility] Suicide Risk Assessment (Signed)
[facility] INPATIENT:  Family/Significant Other Suicide Prevention Education  Suicide Prevention Education:  Education Completed: Tamara Guzman (mom) [phone] wasn't available so safety plan completed with patient,  (name of family member/significant other) has been identified by the patient as the family member/significant other with whom the patient will be residing, and identified as the person(s) who will aid the patient in the event of a mental health crisis (suicidal ideations/suicide attempt).  With written consent from the patient, the family member/significant other has been provided the following suicide prevention education, prior to the and/or following the discharge of the patient.  CSW attempted to call Tamara Guzman (mom) [phone] several times on 1/22 and 1/23 but was unable to reach her.  Patient lives with her mom.  Patient explained that her mom had a stroke.  CSW attempted to call Tamara Guzman (boyfriend) [phone] but there was no response.  CSW completed safety plan with patient.  There are no weapons in the home.  They don't have any sharp objects; they use plastic utensils because of her mom's condition.  CSW discussed 988 and 911.  Patient didn't report any safety concerns in the home.  The suicide prevention education provided includes the following: Suicide risk factors Suicide prevention and interventions National Suicide Hotline telephone number [facility] assessment telephone number [facility] Emergency Assistance 911 [facility] and/or Residential Mobile Crisis Unit telephone number  Request made of family/significant other to: Remove weapons (e.g., guns, rifles, knives), all items previously/currently identified as safety concern.   Remove drugs/medications (over-the-counter, prescriptions, illicit drugs), all items previously/currently identified as a safety concern.  The family member/significant other verbalizes understanding of the suicide  prevention education information provided.  The family member/significant other agrees to remove the items of safety concern listed above.  Tamara Guzman O Tamara Guzman LCSWA 01/05/2024, 10:39 AM

## 2024-01-05 NOTE — TOC Transition Note (Signed)
01/05/2024  Tamara Guzman DOB: [birth year] MRN: [id number]   RIDER WAIVER AND RELEASE OF LIABILITY  For the purposes of helping with transportation needs, [facility] partners with outside transportation providers (taxi companies, [place], Catering manager.) to give [employer] patients or other approved people the choice of on-demand rides [employer]") to our buildings for non-emergency visits.  By using [employer], I, the person signing this document, on behalf of myself and/or any legal minors (in my care using the [employer]), agree:  Science writer given to me are supplied by independent, outside transportation providers who do not work for, or have any affiliation with, [employer]. [facility] is not a transportation company. [facility] has no control over the quality or safety of the rides I get using [employer]. [facility] [employer] has no control over whether any outside ride will happen on time or not. [facility] gives no guarantee on the reliability, quality, safety, or availability on any rides, or that no mistakes will happen. I know and accept that traveling by vehicle (car, truck, SVU, [employer]enaida Niece, bus, taxi, etc.) has risks of serious injuries such as disability, being paralyzed, and death. I know and agree the risk of using [employer] is mine alone, and not [employer]. Transport Services are provided "as is" and as are available. The transportation providers are in charge for all inspections and care of the vehicles used to provide these rides. I agree not to take legal action against [facility], its agents, employees, officers, directors, representatives, insurers, attorneys, assigns, successors, subsidiaries, and affiliates at any time for any reasons related directly or indirectly to using [employer]. I also agree not to take legal action against [employer] or its affiliates for any injury, death, or damage to property caused by or related to  using [employer]. I have read this Waiver and Release of Liability, and I understand the terms used in it and their legal meaning. This Waiver is freely and voluntarily given with the understanding that my right (or any legal minors) to legal action against [place] relating to [employer] is knowingly given up to use these services.   I attest that I read the Ride Waiver and Release of Liability to Tamara Guzman, gave [name] the opportunity to ask questions and answered the questions asked (if any). I affirm that Tamara Guzman then provided consent for assistance with transportation.
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
## 2024-01-05 NOTE — [facility] Group Notes (Addendum)
Spiritual care group on grief and loss facilitated by Chaplain Dyanne Carrel, [facility]  Group Goal: Support / Education around grief and loss  Members engage in facilitated group support and psycho-social education.  Group Description:  Following introductions and group rules, group members engaged in facilitated group dialogue and support around topic of loss, with particular support around experiences of loss in their lives. Group Identified types of loss (relationships / self / things) and identified patterns, circumstances, and changes that precipitate losses. Reflected on thoughts / feelings around loss, normalized grief responses, and recognized variety in grief experience. Group encouraged individual reflection on safe space and on the coping skills that they are already utilizing.  Group drew on Adlerian / Rogerian and narrative framework  Patient Progress: Attended group and actively engaged and participated in group conversation and activities.

## 2024-01-05 NOTE — Progress Notes (Signed)
  [facility] Adult Case Management Discharge Plan :  Will you be returning to the same living situation after discharge:  Yes,  patient will go to her mom's home, [place] At discharge, do you have transportation home?: Yes,  CSW arranged [employer] at 1:30 PM Do you have the ability to pay for your medications: Yes,  patient has Medicaid  Release of information consent forms completed and in the chart;  Patient's signature needed at discharge.  Patient to Follow up at:  Follow-up Information     [facility]. Go to.   Specialty: Behavioral Health Why: Please go to this provider for medication management services.  You may call to schedule an appointment or go Monday through Friday, arrive at 7:00 am. Contact information: [address] [place] [address] [phone]        [place]. Go to.   Specialty: Professional Counselor Why: You may go to this provider for therapy services on Monday through Friday, 9 am to 1 pm for an initial assessment. Contact information: [address] [place] [address] [phone]                 Next level of care provider has access to [facility] Link:no  Safety Planning and Suicide Prevention discussed:  Yes, completed with patient     Has patient been referred to the Quitline?: Patient refused referral for treatment  Patient has been referred for addiction treatment: No known substance use disorder.  Ronnisha Felber O Kele Barthelemy, LCSWA 01/05/2024, 11:31 AM

## 2024-01-05 NOTE — Progress Notes (Signed)

## 2024-01-05 NOTE — [facility]HH Suicide Risk Assessment (Signed)
[facility] Discharge Suicide Risk Assessment   Principal Problem: Bipolar I disorder, most recent episode (or current) manic, moderate with mixed features (HCC)  Discharge Diagnoses: Principal Problem:   Bipolar I disorder, most recent episode (or current) manic, moderate with mixed features (HCC) Active Problems:   Post traumatic stress disorder (PTSD)   Substance use disorder      Total Time spent with patient: 40  Musculoskeletal: Strength & Muscle Tone: within normal limits Gait & Station: normal Patient leans: N/A   Psychiatric Specialty Exam:  Presentation  General Appearance: Appropriate for Environment  Eye Contact: Good  Speech: Clear and Coherent; Normal Rate  Speech Volume: Normal  Handedness: Right   Mood and Affect  Mood: Euthymic  Affect: Congruent   Thought Process  Thought Processes: Linear  Descriptions of Associations: Intact  Orientation: Full (Time, Place and Person)  Thought Content: Logical  History of Schizophrenia/Schizoaffective disorder: No  Duration of Psychotic Symptoms: NA Hallucinations: Hallucinations: None  Ideas of Reference: None  Suicidal Thoughts: Suicidal Thoughts: No  Homicidal Thoughts: Homicidal Thoughts: No   Sensorium  Memory: Immediate Good  Judgment: Poor  Insight: Good   Executive Functions  Concentration: Good  Attention Span: Good  Recall: Good  Fund of Knowledge: Good  Language: Good   Psychomotor Activity  Psychomotor Activity: Psychomotor Activity: Normal   Assets  Assets: Communication Skills; Social Support; Housing; Leisure Time   Sleep  Sleep: Sleep: Good   Physical Exam: General: Sitting comfortably. NAD. HEENT: Normocephalic, atraumatic, MMM, EMOI Lungs: no increased work of breathing noted Heart: no cyanosis Abdomen: Non distended Musculoskeletal: FROM. No obvious deformities Skin: Warm, dry, intact. No rashes noted Neuro: No obvious focal deficits.  Gait and station  are normal  Review of Systems  Constitutional: Negative.   HENT: Negative.    Eyes: Negative.   Respiratory: Negative.    Cardiovascular: Negative.   Gastrointestinal: Negative.   Genitourinary: Negative.   Skin: Negative.   Neurological: Negative.   Psychiatric/Behavioral:  Negative   Mental Status Per Nursing Assessment: Suicidal ideation indicated by patient  Demographic Factors:  Adolescent or young adult, Low socioeconomic status, and Unemployed  Loss Factors: Legal issues  Historical Factors: Prior suicide attempts and Impulsivity  Risk Reduction Factors:   Sense of responsibility to family and Positive social support  Continued Clinical Symptoms:  Previous psychiatric diagnoses and treatments  Cognitive Features That Contribute To Risk:  None  Suicide Risk:  Minimal: No identifiable suicidal ideation.  Patients presenting with no risk factors but with morbid ruminations; may be classified as minimal risk based on the severity of the depressive symptoms.    Follow-up Information     [facility]. Go to.   Specialty: Behavioral Health Why: Please go to this provider for medication management services.  You may call to schedule an appointment or go Monday through Friday, arrive at 7:00 am. Contact information: [address] [place] [address] [phone]        [place]. Go to.   Specialty: Professional Counselor Why: You may go to this provider for therapy services on Monday through Friday, 9 am to 1 pm for an initial assessment. Contact information: [address] [place] [address] [phone]                  Plan Of Care/Follow-up recommendations:  Activity: as tolerated  Diet: heart healthy  Other: -Follow-up with your outpatient psychiatric provider -instructions on appointment date,  time, and address (location) are provided to you in discharge  paperwork.  -Take your psychiatric medications as prescribed at discharge - instructions are provided to you in the discharge paperwork  -Follow-up with outpatient primary care doctor and other specialists -for management of preventative medicine and chronic medical issues  -Testing: Follow-up with outpatient provider for abnormal lab results: Vitamin D deficiency  -If you are prescribed an atypical antipsychotic medication, we recommend that your outpatient psychiatrist follow routine screening for side effects within 3 months of discharge, including monitoring: AIMS scale, height, weight, blood pressure, fasting lipid panel, HbA1c, and fasting blood sugar.   -Recommend total abstinence from alcohol, tobacco, and other illicit drug use at discharge.   -If your psychiatric symptoms recur, worsen, or if you have side effects to your psychiatric medications, call your outpatient psychiatric provider, 911, [phone] or go to the nearest emergency department.  -If suicidal thoughts occur, immediately call your outpatient psychiatric provider, 911, [phone] or go to the nearest emergency department.   Criss Alvine, MD 01/05/24 12:01 PM

## 2024-01-23 ENCOUNTER — Ambulatory Visit (HOSPITAL_COMMUNITY): Payer: Self-pay

## 2024-01-27 ENCOUNTER — Ambulatory Visit: Payer: MEDICAID

## 2024-02-14 ENCOUNTER — Other Ambulatory Visit (HOSPITAL_BASED_OUTPATIENT_CLINIC_OR_DEPARTMENT_OTHER): Payer: Self-pay

## 2024-04-19 ENCOUNTER — Ambulatory Visit (HOSPITAL_COMMUNITY)
Admission: EM | Admit: 2024-04-19 | Discharge: 2024-04-20 | Disposition: A | Discharge status: Home / Self Care | Payer: MEDICAID | Attending: Psychiatry | Admitting: Psychiatry

## 2024-04-19 DIAGNOSIS — F4310 Post-traumatic stress disorder, unspecified: Secondary | ICD-10-CM | POA: Insufficient documentation

## 2024-04-19 DIAGNOSIS — Z91128 Patient's intentional underdosing of medication regimen for other reason: Secondary | ICD-10-CM | POA: Insufficient documentation

## 2024-04-19 DIAGNOSIS — F411 Generalized anxiety disorder: Secondary | ICD-10-CM | POA: Diagnosis not present

## 2024-04-19 DIAGNOSIS — Z760 Encounter for issue of repeat prescription: Secondary | ICD-10-CM | POA: Insufficient documentation

## 2024-04-19 DIAGNOSIS — T43596A Underdosing of other antipsychotics and neuroleptics, initial encounter: Secondary | ICD-10-CM | POA: Insufficient documentation

## 2024-04-19 DIAGNOSIS — F1210 Cannabis abuse, uncomplicated: Secondary | ICD-10-CM | POA: Insufficient documentation

## 2024-04-19 DIAGNOSIS — F339 Major depressive disorder, recurrent, unspecified: Secondary | ICD-10-CM

## 2024-04-19 DIAGNOSIS — R45851 Suicidal ideations: Secondary | ICD-10-CM | POA: Insufficient documentation

## 2024-04-19 DIAGNOSIS — F3160 Bipolar disorder, current episode mixed, unspecified: Secondary | ICD-10-CM | POA: Diagnosis not present

## 2024-04-19 LAB — CBC WITH DIFFERENTIAL/PLATELET
Abs Immature Granulocytes: 0.01 10*3/uL (ref 0.00–0.07)
Basophils Absolute: 0 10*3/uL (ref 0.0–0.1)
Basophils Relative: 1 %
Eosinophils Absolute: 0.2 10*3/uL (ref 0.0–0.5)
Eosinophils Relative: 3 %
HCT: 36.9 % (ref 36.0–46.0)
Hemoglobin: 12.1 g/dL (ref 12.0–15.0)
Immature Granulocytes: 0 %
Lymphocytes Relative: 31 %
Lymphs Abs: 1.7 10*3/uL (ref 0.7–4.0)
MCH: 26.7 pg (ref 26.0–34.0)
MCHC: 32.8 g/dL (ref 30.0–36.0)
MCV: 81.3 fL (ref 80.0–100.0)
Monocytes Absolute: 0.7 10*3/uL (ref 0.1–1.0)
Monocytes Relative: 13 %
Neutro Abs: 2.9 10*3/uL (ref 1.7–7.7)
Neutrophils Relative %: 52 %
Platelets: 302 10*3/uL (ref 150–400)
RBC: 4.54 MIL/uL (ref 3.87–5.11)
RDW: 14 % (ref 11.5–15.5)
WBC: 5.5 10*3/uL (ref 4.0–10.5)
nRBC: 0 % (ref 0.0–0.2)

## 2024-04-19 LAB — COMPREHENSIVE METABOLIC PANEL WITH GFR
ALT: 12 U/L (ref 0–44)
AST: 24 U/L (ref 15–41)
Albumin: 3.7 g/dL (ref 3.5–5.0)
Alkaline Phosphatase: 63 U/L (ref 38–126)
Anion gap: 9 (ref 5–15)
BUN: 9 mg/dL (ref 6–20)
CO2: 22 mmol/L (ref 22–32)
Calcium: 8.8 mg/dL — ABNORMAL LOW (ref 8.9–10.3)
Chloride: 106 mmol/L (ref 98–111)
Creatinine, Ser: 0.92 mg/dL (ref 0.44–1.00)
GFR, Estimated: >60 mL/min (ref >60)
Glucose, Bld: 106 mg/dL — ABNORMAL HIGH (ref 70–99)
Potassium: 4 mmol/L (ref 3.5–5.1)
Sodium: 137 mmol/L (ref 135–145)
Total Bilirubin: 0.5 mg/dL (ref 0.0–1.2)
Total Protein: 6.7 g/dL (ref 6.5–8.1)

## 2024-04-19 LAB — ETHANOL: Alcohol, Ethyl (B): <15 mg/dL (ref <15)

## 2024-04-19 LAB — TSH: TSH: 0.702 u[IU]/mL (ref 0.350–4.500)

## 2024-04-19 MED ORDER — ARIPIPRAZOLE 15 MG PO TABS: 15.0000 mg | ORAL_TABLET | Freq: Every day | ORAL | Status: DC

## 2024-04-19 MED ORDER — OLANZAPINE 10 MG IM SOLR: 10.0000 mg | Freq: Three times a day (TID) | INTRAMUSCULAR | Status: DC | PRN

## 2024-04-19 MED ORDER — ACETAMINOPHEN 325 MG PO TABS: 650.0000 mg | ORAL_TABLET | Freq: Four times a day (QID) | ORAL | Status: DC | PRN

## 2024-04-19 MED ORDER — OLANZAPINE 5 MG PO TBDP: 5.0000 mg | ORAL_TABLET | Freq: Three times a day (TID) | ORAL | Status: DC | PRN

## 2024-04-19 MED ORDER — MAGNESIUM HYDROXIDE 400 MG/5ML PO SUSP: 30.0000 mL | Freq: Every day | ORAL | Status: DC | PRN

## 2024-04-19 MED ORDER — ALUM & MAG HYDROXIDE-SIMETH 200-200-20 MG/5ML PO SUSP: 30.0000 mL | ORAL | Status: DC | PRN

## 2024-04-19 MED ORDER — HYDROXYZINE HCL 25 MG PO TABS
25.0000 mg | ORAL_TABLET | Freq: Three times a day (TID) | ORAL | Status: DC
  Administered 2024-04-19 – 2024-04-20 (×2): 25 mg via ORAL
  Filled 2024-04-19 (×2): qty 1

## 2024-04-19 MED ORDER — OLANZAPINE 10 MG IM SOLR
5.0000 mg | Freq: Three times a day (TID) | INTRAMUSCULAR | Status: DC | PRN
Start: 2024-04-19 — End: 2024-04-20

## 2024-04-19 MED ORDER — TRAZODONE HCL 50 MG PO TABS
50.0000 mg | ORAL_TABLET | Freq: Every evening | ORAL | Status: DC | PRN
  Administered 2024-04-19: 50 mg via ORAL
  Filled 2024-04-19: qty 1

## 2024-04-19 MED ORDER — ARIPIPRAZOLE 2 MG PO TABS
2.0000 mg | ORAL_TABLET | Freq: Every day | ORAL | Status: DC
  Administered 2024-04-19: 2 mg via ORAL
  Filled 2024-04-19 (×2): qty 1

## 2024-04-19 NOTE — ED Provider Notes (Signed)
 [facility] Continuous Assessment Admission H&P  Date: 04/20/24 Patient Name: Tamara Guzman MRN: [id number] Chief Complaint: need to restart her medication  Diagnoses:  Final diagnoses:  Encounter for medication refill  Suicidal thoughts  Recurrent major depressive disorder, remission status unspecified (HCC)    HPI: Tamara Guzman, 23 y/o female with a history of bipolar disorder, MDD, suicide attempts, presented to [facility], voluntarily.  Per the patient she is trying to get back on her medications.  According to the patient she could not remember what medicine she is on.  Patient currently stating she is not seeing a provider or a therapist.  Patient reports she lives with her boyfriend however patient does appear to be disheveled.  Patient does not seem to be a good historian.  Most information was retrieved from the patient's chart.  Patient also stated she was suicidal but does not have a plan.   Copied from triage notes: Pt is a 23 yo female who presents voluntarily to [facility] requesting medications. Pt appears upset after making a phone call. Pt refused to engage in the assessment. Pt was a poor historian and presents agitated. Triage discontinued due to pt refusing to answer questions.    Face-to-face observation of patient, patient is alert and oriented x 4, speech is clear, patient does seem to be angry get upset really easily.  Patient is easily redirected.  Patient endorsed suicidal ideation but denies any plan.  Denies alcohol use, denies illicit drug use.  Reports she smoked cigarettes.  Denies HI, AVH or paranoia.  It is unclear if patient is influenced by internal stimuli as patient is not very talkative.  Given patient prior history and current presentation.  Writer discussed with patient the need for admissions.  Patient in agreement.  Recommend observation with further evaluation in the a.m.  Total Time spent with patient: 20 minutes  Musculoskeletal   Strength & Muscle Tone: within normal limits Gait & Station: normal Patient leans: N/A  Psychiatric Specialty Exam  Presentation General Appearance:  Disheveled  Eye Contact: Good  Speech: Clear and Coherent  Speech Volume: Normal  Handedness: Right   Mood and Affect  Mood: Euthymic  Affect: Congruent   Thought Process  Thought Processes: Linear  Descriptions of Associations:Intact  Orientation:Full (Time, Place and Person)  Thought Content:WDL  Diagnosis of Schizophrenia or Schizoaffective disorder in past: No   Hallucinations:Hallucinations: None  Ideas of Reference:None  Suicidal Thoughts:Suicidal Thoughts: Yes, Passive SI Passive Intent and/or Plan: Without Intent; Without Plan  Homicidal Thoughts:Homicidal Thoughts: No   Sensorium  Memory: Immediate Poor  Judgment: Poor  Insight: Lacking   Executive Functions  Concentration: Fair  Attention Span: Good  Recall: Fair  Fund of Knowledge: Fair  Language: Good   Psychomotor Activity  Psychomotor Activity: Psychomotor Activity: Normal   Assets  Assets: Desire for Improvement; Social Support; Vocational/Educational   Sleep  Sleep: Sleep: Fair Number of Hours of Sleep: 8   Nutritional Assessment (For OBS and FBC admissions only) Has the patient had a weight loss or gain of 10 pounds or more in the last 3 months?: No Has the patient had a decrease in food intake/or appetite?: No Does the patient have dental problems?: No Does the patient have eating habits or behaviors that may be indicators of an eating disorder including binging or inducing vomiting?: No Has the patient recently lost weight without trying?: 0 Has the patient been eating poorly because of a decreased appetite?: 0 Malnutrition Screening Tool Score:  0    Physical Exam HENT:     Head: Normocephalic.     Nose: Nose normal.  Eyes:     Pupils: Pupils are equal, round, and reactive to light.   Cardiovascular:     Rate and Rhythm: Normal rate.  Pulmonary:     Effort: Pulmonary effort is normal.  Musculoskeletal:        General: Normal range of motion.     Cervical back: Normal range of motion.  Neurological:     General: No focal deficit present.     Mental Status: She is alert.  Psychiatric:        Mood and Affect: Mood normal.        Behavior: Behavior normal.        Thought Content: Thought content normal.        Judgment: Judgment normal.    Review of Systems  Constitutional: Negative.   HENT: Negative.    Eyes: Negative.   Respiratory: Negative.    Cardiovascular: Negative.   Gastrointestinal: Negative.   Genitourinary: Negative.   Musculoskeletal: Negative.   Skin: Negative.   Neurological: Negative.   Psychiatric/Behavioral:  Positive for depression and suicidal ideas. The patient is nervous/anxious.     Blood pressure 108/73, pulse 92, temperature 98.3 F (36.8 C), temperature source Oral, resp. rate 18, SpO2 100%. There is no height or weight on file to calculate BMI.  Past Psychiatric History: Bipolar disorder, MDD, suicidal ideation  Is the patient at risk to self? Yes  Has the patient been a risk to self in the past 6 months? Yes .    Has the patient been a risk to self within the distant past? Yes   Is the patient a risk to others? No   Has the patient been a risk to others in the past 6 months? No   Has the patient been a risk to others within the distant past? No   Past Medical History: See chart  Family History: Unknown  Social History: tobacco use  Last Labs:  Admission on 04/19/2024  Component Date Value Ref Range Status   WBC 04/19/2024 5.5  4.0 - 10.5 K/uL Final   RBC 04/19/2024 4.54  3.87 - 5.11 MIL/uL Final   Hemoglobin 04/19/2024 12.1  12.0 - 15.0 g/dL Final   HCT 21/30/8657 36.9  36.0 - 46.0 % Final   MCV 04/19/2024 81.3  80.0 - 100.0 fL Final   MCH 04/19/2024 26.7  26.0 - 34.0 pg Final   MCHC 04/19/2024 32.8  30.0 - 36.0  g/dL Final   RDW 84/69/6295 14.0  11.5 - 15.5 % Final   Platelets 04/19/2024 302  150 - 400 K/uL Final   nRBC 04/19/2024 0.0  0.0 - 0.2 % Final   Neutrophils Relative % 04/19/2024 52  % Final   Neutro Abs 04/19/2024 2.9  1.7 - 7.7 K/uL Final   Lymphocytes Relative 04/19/2024 31  % Final   Lymphs Abs 04/19/2024 1.7  0.7 - 4.0 K/uL Final   Monocytes Relative 04/19/2024 13  % Final   Monocytes Absolute 04/19/2024 0.7  0.1 - 1.0 K/uL Final   Eosinophils Relative 04/19/2024 3  % Final   Eosinophils Absolute 04/19/2024 0.2  0.0 - 0.5 K/uL Final   Basophils Relative 04/19/2024 1  % Final   Basophils Absolute 04/19/2024 0.0  0.0 - 0.1 K/uL Final   Immature Granulocytes 04/19/2024 0  % Final   Abs Immature Granulocytes 04/19/2024 0.01  0.00 - 0.07  K/uL Final   Performed at [facility], [address] [place], [place] [address]   Sodium 04/19/2024 137  135 - 145 mmol/L Final   Potassium 04/19/2024 4.0  3.5 - 5.1 mmol/L Final   Chloride 04/19/2024 106  98 - 111 mmol/L Final   CO2 04/19/2024 22  22 - 32 mmol/L Final   Glucose, Bld 04/19/2024 106 (H)  70 - 99 mg/dL Final   Glucose reference range applies only to samples taken after fasting for at least 8 hours.   BUN 04/19/2024 9  6 - 20 mg/dL Final   Creatinine, Ser 04/19/2024 0.92  0.44 - 1.00 mg/dL Final   Calcium 95/63/8756 8.8 (L)  8.9 - 10.3 mg/dL Final   Total Protein 43/32/9518 6.7  6.5 - 8.1 g/dL Final   Albumin 84/16/6063 3.7  3.5 - 5.0 g/dL Final   AST 01/60/1093 24  15 - 41 U/L Final   ALT 04/19/2024 12  0 - 44 U/L Final   Alkaline Phosphatase 04/19/2024 63  38 - 126 U/L Final   Total Bilirubin 04/19/2024 0.5  0.0 - 1.2 mg/dL Final   GFR, Estimated 04/19/2024 >60  >60 mL/min Final   Comment: (NOTE) Calculated using the CKD-EPI Creatinine Equation (2021)    Anion gap 04/19/2024 9  5 - 15 Final   Performed at [facility], [address], [place], [place] [address]   Alcohol, Ethyl (B) 04/19/2024 <15  <15 mg/dL Final    Comment: Please note change in reference range. (NOTE) For medical purposes only. Performed at [facility], [address], [place], [place] [address]    TSH 04/19/2024 0.702  0.350 - 4.500 uIU/mL Final   Comment: Performed by a 3rd Generation assay with a functional sensitivity of <=0.01 uIU/mL. Performed at [facility], [address] [place], [place] [address]   Admission on 01/01/2024, Discharged on 01/05/2024  Component Date Value Ref Range Status   Vitamin B-12 01/03/2024 342  180 - 914 pg/mL Final   Comment: (NOTE) This assay is not validated for testing neonatal or myeloproliferative syndrome specimens for Vitamin B12 levels. Performed at [facility], [address], [place], [place] [address]    Folate 01/03/2024 15.9  >5.9 ng/mL Final   Performed at [facility], [address], [place], [place] [address]   Vit D, 25-Hydroxy 01/03/2024 14.94 (L)  30 - 100 ng/mL Final   Comment: (NOTE) Vitamin D  deficiency has been defined by the Institute of Medicine  and an Endocrine Society practice guideline as a level of serum 25-OH  vitamin D  less than 20 ng/mL (1,2). The Endocrine Society went on to  further define vitamin D  insufficiency as a level between 21 and 29  ng/mL (2).  1. IOM (Institute of Medicine). 2010. Dietary reference intakes for  calcium and D. [place]: [employer]. 2. Holick MF, Binkley [place], Bischoff-Ferrari HA, et al. Evaluation,  treatment, and prevention of vitamin D  deficiency: an Endocrine  Society clinical practice guideline, JCEM. 2011 Jul; 96(7): 1911-30.  Performed at [facility], [address], [place], [place] [address]    RPR Ser Ql 01/03/2024 NON REACTIVE  NON REACTIVE Final   Performed at [facility], [address] [place], [place] [address]   Sodium 01/03/2024 132 (L)  135 - 145 mmol/L Final   Potassium 01/03/2024 3.9  3.5 - 5.1 mmol/L Final    Chloride  01/03/2024 100  98 - 111 mmol/L Final   CO2 01/03/2024 23  22 - 32 mmol/L Final   Glucose, Bld 01/03/2024 134 (H)  70 - 99 mg/dL Final   Glucose reference range applies only to samples taken after fasting for at least 8 hours.   BUN 01/03/2024 12  6 - 20 mg/dL Final   Creatinine, Ser 01/03/2024 0.80  0.44 - 1.00 mg/dL Final   Calcium 40/98/1191 8.2 (L)  8.9 - 10.3 mg/dL Final   GFR, Estimated 01/03/2024 >60  >60 mL/min Final   Comment: (NOTE) Calculated using the CKD-EPI Creatinine Equation (2021)    Anion gap 01/03/2024 9  5 - 15 Final   Performed at [facility], [address] [place], [place] [address]  Admission on 12/30/2023, Discharged on 01/01/2024  Component Date Value Ref Range Status   WBC 12/31/2023 4.2  4.0 - 10.5 K/uL Final   RBC 12/31/2023 4.59  3.87 - 5.11 MIL/uL Final   Hemoglobin 12/31/2023 12.1  12.0 - 15.0 g/dL Final   HCT 56/21/3086 37.2  36.0 - 46.0 % Final   MCV 12/31/2023 81.0  80.0 - 100.0 fL Final   MCH 12/31/2023 26.4  26.0 - 34.0 pg Final   MCHC 12/31/2023 32.5  30.0 - 36.0 g/dL Final   RDW 57/84/6962 14.0  11.5 - 15.5 % Final   Platelets 12/31/2023 286  150 - 400 K/uL Final   nRBC 12/31/2023 0.0  0.0 - 0.2 % Final   Neutrophils Relative % 12/31/2023 66  % Final   Neutro Abs 12/31/2023 2.8  1.7 - 7.7 K/uL Final   Lymphocytes Relative 12/31/2023 18  % Final   Lymphs Abs 12/31/2023 0.7  0.7 - 4.0 K/uL Final   Monocytes Relative 12/31/2023 14  % Final   Monocytes Absolute 12/31/2023 0.6  0.1 - 1.0 K/uL Final   Eosinophils Relative 12/31/2023 1  % Final   Eosinophils Absolute 12/31/2023 0.0  0.0 - 0.5 K/uL Final   Basophils Relative 12/31/2023 1  % Final   Basophils Absolute 12/31/2023 0.0  0.0 - 0.1 K/uL Final   Immature Granulocytes 12/31/2023 0  % Final   Abs Immature Granulocytes 12/31/2023 0.01  0.00 - 0.07 K/uL Final   Performed at [facility], [address] [place], [place] [address]   Sodium 12/31/2023 132 (L)   135 - 145 mmol/L Final   Potassium 12/31/2023 3.6  3.5 - 5.1 mmol/L Final   Chloride 12/31/2023 95 (L)  98 - 111 mmol/L Final   CO2 12/31/2023 29  22 - 32 mmol/L Final   Glucose, Bld 12/31/2023 62 (L)  70 - 99 mg/dL Final   Glucose reference range applies only to samples taken after fasting for at least 8 hours.   BUN 12/31/2023 13  6 - 20 mg/dL Final   Creatinine, Ser 12/31/2023 0.98  0.44 - 1.00 mg/dL Final   Calcium 13/24/4010 9.0  8.9 - 10.3 mg/dL Final   Total Protein 27/25/3664 6.9  6.5 - 8.1 g/dL Final   Albumin 40/34/7425 3.9  3.5 - 5.0 g/dL Final   AST 95/63/8756 36  15 - 41 U/L Final   ALT 12/31/2023 12  0 - 44 U/L Final   Alkaline Phosphatase 12/31/2023 67  38 - 126 U/L Final   Total Bilirubin 12/31/2023 0.8  0.0 - 1.2 mg/dL Final   GFR, Estimated 12/31/2023 >60  >60 mL/min Final   Comment: (NOTE) Calculated using the CKD-EPI Creatinine Equation (2021)  Anion gap 12/31/2023 8  5 - 15 Final   Performed at [facility], [address], [place], [place] [address]   Hgb A1c MFr Bld 12/31/2023 5.2  4.8 - 5.6 % Final   Comment: (NOTE) Pre diabetes:          5.7%-6.4%  Diabetes:              >6.4%  Glycemic control for   <7.0% adults with diabetes    Mean Plasma Glucose 12/31/2023 102.54  mg/dL Final   Performed at [facility], [address], [place], [place] [address]   Alcohol, Ethyl (B) 12/31/2023 <10  <10 mg/dL Final   Comment: (NOTE) Lowest detectable limit for serum alcohol is 10 mg/dL.  For medical purposes only. Performed at [facility], [address], [place], [place] [address]    POC Amphetamine UR 01/01/2024 None Detected  NONE DETECTED (Cut Off Level 1000 ng/mL) Final   POC Secobarbital (BAR) 01/01/2024 None Detected  NONE DETECTED (Cut Off Level 300 ng/mL) Final   POC Buprenorphine (BUP) 01/01/2024 None Detected  NONE DETECTED (Cut Off Level 10 ng/mL) Final   POC Oxazepam (BZO) 01/01/2024 None Detected  NONE DETECTED (Cut Off Level  300 ng/mL) Final   POC Cocaine UR 01/01/2024 Positive (A)  NONE DETECTED (Cut Off Level 300 ng/mL) Final   POC Methamphetamine UR 01/01/2024 None Detected  NONE DETECTED (Cut Off Level 1000 ng/mL) Final   POC Morphine 01/01/2024 None Detected  NONE DETECTED (Cut Off Level 300 ng/mL) Final   POC Methadone UR 01/01/2024 None Detected  NONE DETECTED (Cut Off Level 300 ng/mL) Final   POC Oxycodone UR 01/01/2024 None Detected  NONE DETECTED (Cut Off Level 100 ng/mL) Final   POC Marijuana UR 01/01/2024 None Detected  NONE DETECTED (Cut Off Level 50 ng/mL) Final   Cholesterol 12/31/2023 148  0 - 200 mg/dL Final   Triglycerides 91/47/8295 55  <150 mg/dL Final   HDL 62/13/0865 51  >40 mg/dL Final   Total CHOL/HDL Ratio 12/31/2023 2.9  RATIO Final   VLDL 12/31/2023 11  0 - 40 mg/dL Final   LDL Cholesterol 12/31/2023 86  0 - 99 mg/dL Final   Comment:        Total Cholesterol/HDL:CHD Risk Coronary Heart Disease Risk Table                     Men   Women  1/2 Average Risk   3.4   3.3  Average Risk       5.0   4.4  2 X Average Risk   9.6   7.1  3 X Average Risk  23.4   11.0        Use the calculated Patient Ratio above and the CHD Risk Table to determine the patient's CHD Risk.        ATP III CLASSIFICATION (LDL):  <100     mg/dL   Optimal  784-696  mg/dL   Near or Above                    Optimal  130-159  mg/dL   Borderline  295-284  mg/dL   High  >132     mg/dL   Very High Performed at [facility], [address] [place], [place] [address]    TSH 12/31/2023 1.189  0.350 - 4.500 uIU/mL Final   Comment: Performed by a 3rd Generation assay  with a functional sensitivity of <=0.01 uIU/mL. Performed at [facility], [address] [place], [place] [address]    SARSCOV2ONAVIRUS 2 AG 12/31/2023 NEGATIVE  NEGATIVE Final   Comment: (NOTE) SARS-CoV-2 antigen NOT DETECTED.   Negative results are presumptive.  Negative results do not preclude SARS-CoV-2 infection and should not be  used as the sole basis for treatment or other patient management decisions, including infection  control decisions, particularly in the presence of clinical signs and  symptoms consistent with COVID-19, or in those who have been in contact with the virus.  Negative results must be combined with clinical observations, patient history, and epidemiological information. The expected result is Negative.  Fact Sheet for Patients: https://www.jennings-kim.com/  Fact Sheet for Healthcare Providers: https://alexander-rogers.biz/  This test is not yet approved or cleared by the [place]  FDA and  has been authorized for detection and/or diagnosis of SARS-CoV-2 by FDA under an Emergency Use Authorization (EUA).  This EUA will remain in effect (meaning this test can be used) for the duration of  the COV                          ID-19 declaration under Section 564(b)(1) of the Act, 21 U.S.C. section 360bbb-3(b)(1), unless the authorization is terminated or revoked sooner.     Preg Test, Ur 01/01/2024 NEGATIVE  NEGATIVE Final   Comment:        THE SENSITIVITY OF THIS METHODOLOGY IS >24 mIU/mL     Allergies: Other, Chocolate, and Peanut-containing drug products  Medications:  Facility Ordered Medications  Medication   acetaminophen  (TYLENOL ) tablet 650 mg   alum & mag hydroxide-simeth (MAALOX/MYLANTA) 200-200-20 MG/5ML suspension 30 mL   magnesium  hydroxide (MILK OF MAGNESIA) suspension 30 mL   OLANZapine  zydis (ZYPREXA ) disintegrating tablet 5 mg   OLANZapine  (ZYPREXA ) injection 5 mg   OLANZapine  (ZYPREXA ) injection 10 mg   hydrOXYzine  (ATARAX ) tablet 25 mg   traZODone  (DESYREL ) tablet 50 mg   ARIPiprazole  (ABILIFY ) tablet 2 mg   PTA Medications  Medication Sig   ARIPiprazole  (ABILIFY ) 15 MG tablet Take 1 tablet (15 mg total) by mouth at bedtime.   hydrOXYzine  (ATARAX ) 25 MG tablet Take 1 tablet (25 mg total) by mouth 3 (three) times daily.   traZODone   (DESYREL ) 50 MG tablet Take 1 tablet (50 mg total) by mouth at bedtime as needed for sleep.   Vitamin D , Ergocalciferol , (DRISDOL ) 1.25 MG (50000 UNIT) CAPS capsule Take 1 capsule (50,000 Units total) by mouth every 7 (seven) days.      Medical Decision Making  Observation units    Recommendations  Based on my evaluation the patient does not appear to have an emergency medical condition.  Dorthea Gauze, NP 04/20/24  5:48 AM
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
## 2024-04-20 ENCOUNTER — Other Ambulatory Visit: Payer: Self-pay

## 2024-04-20 MED ORDER — TRAZODONE HCL 50 MG PO TABS: 50.0000 mg | ORAL_TABLET | Freq: Every evening | ORAL | 0 refills | Status: DC | PRN

## 2024-04-20 MED ORDER — ARIPIPRAZOLE 5 MG PO TABS: 5.0000 mg | ORAL_TABLET | Freq: Every day | ORAL | 0 refills | Status: DC

## 2024-04-20 MED ORDER — ARIPIPRAZOLE 2 MG PO TABS
2.0000 mg | ORAL_TABLET | Freq: Once | ORAL | Status: DC
  Filled 2024-04-20: qty 1

## 2024-04-20 MED ORDER — HYDROXYZINE HCL 25 MG PO TABS: 25.0000 mg | ORAL_TABLET | Freq: Three times a day (TID) | ORAL | 0 refills | Status: DC

## 2024-04-20 NOTE — ED Notes (Signed)
 Pt A&O x 4, presents with suicidal ideations, no plan noted. Pt anxious and irritable, pressured speech noted.  Comfort measures given. Monitoring for safety.

## 2024-04-20 NOTE — ED Provider Notes (Signed)
 FBC/OBS ASAP Discharge Summary  Date and Time: 04/20/2024 11:22 AM  Name: Tamara Guzman  MRN:  [id number]   Discharge Diagnoses:  Final diagnoses:  Encounter for medication refill  Suicidal thoughts  Bipolar I disorder, most recent episode mixed [facility])    Subjective:  Patient reports presenting to re-establish psychiatric care due to being off Abilify  since January. She states that Abilify  has historically helped manage her anxiety and has kept her manic episodes at bay. She describes prior manic episodes as marked by aggression, yelling, and emotional lability, typically lasting up to two weeks. She believes she has been experiencing a depressive episode, characterized by poor concentration, fatigue, guilt, and feelings of worthlessness and hopelessness. She reports poor sleep but notes her appetite has remained unchanged. She endorses passive suicidal thoughts without plan or intent. She denies current or past homicidal ideation, auditory or visual hallucinations, and nonsuicidal self-injurious behavior. She has a history of one suicide attempt at age 60. She states, "Life is worth it; there is much more to life I haven't experienced." She plans to return home with her boyfriend and acknowledges that her relationship is at risk if she does not stabilize her mental health.  Collateral obtained form patient's significant other, [name], at [phone] [facility] reports he and the patient have been together for the past 9 months, which is about the length of time that she has been without her medications.  She has been having frequent "temper tantrums", but does not believe that patient has ever been at risk of harm to self or others.  He reports having 3 adolescent children in the home, who have been avoiding her due to her labile behaviors.  He expressed concern about her compliance to medications and following through with outpatient follow-up.  He was reassured that patient has had appointment  set up, and appeared motivated to follow through with these recommendations.  He has no concerns, all questions were answered.  He has no objections to accepting the patient back in the home to   Stay Summary:   During the patient's stay at [facility], patient had extensive initial psychiatric evaluation, and daily follow-up psychiatric evaluations.  Psychiatric diagnoses provided upon initial assessment:  Bipolar 1 disorder, current episode mixed  Patient's psychiatric medications were adjusted on admission:  The evening the patient arrived, she was started on Abilify  2 mg and was titrated to 4 mg daily on day of discharge Historically the patient has stabilized at Abilify  15 mg daily (which will likely be the target dose to be titrated during outpatient follow-up) and patient was given a 5-day prescription of Abilify  5 mg to bridge her until her outpatient appointment Started hydroxyzine  25 mg 3 times daily as needed for anxiety (prescription for 30 days, 0 refills provided at discharge) Started trazodone  50 mg nightly as needed for insomnia (prescription for 30 days, 0 refills provided at discharge)  Patient's care was discussed during the interdisciplinary team meeting every day during their stay at [facility].   The patient denies having side effects to prescribed psychiatric medication.  Gradually, patient started adjusting to milieu. The patient was evaluated each day by a clinical provider to ascertain response to treatment. Improvement was noted by the patient's report of decreasing symptoms, improved sleep and appetite, affect, medication tolerance, behavior, and participation in unit programming.  Patient was asked each day to complete a self inventory noting mood, mental status, pain, new symptoms, anxiety and concerns.    Symptoms were reported as significantly  decreased or resolved completely by discharge.    Total Time spent with patient: 1 hour  Past Psychiatric Hx: Current  Psychiatrist:Denies Current Therapist: Denies Psychiatric Dx: Current meds: abilify , hydroxyzyne, Psychiatric medication history: prozac , seroquel  Psychiatric medication compliance history: Psychiatric hospitalizations: reports multiple prior hospitalizations Neuromodulation history:  History of suicide: History of homicide or aggression:    Substance Use Hx: Alcohol: denies Tobacco: smokes, 1 ppd for the past year Cannabis: Denies Illicit drugs:Denies Rx drug abuse: Detox hx:  Rehab hx:    Family Medical Hx: unknown to pt Family Psychiatric Hx: unknown pt    Social Hx: [place], with her boyfriend (3 years) Legal status: Single Children: Denies Educational Hx: HS 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 June 10th for assault of government official Access to weapons: Denies   Current Medications:  Current Facility-Administered Medications  Medication Dose Route Frequency Provider Last Rate Last Admin   acetaminophen  (TYLENOL ) tablet 650 mg  650 mg Oral Q6H PRN Dorthea Gauze, NP       alum & mag hydroxide-simeth (MAALOX/MYLANTA) 200-200-20 MG/5ML suspension 30 mL  30 mL Oral Q4H PRN Dorthea Gauze, NP       ARIPiprazole  (ABILIFY ) tablet 2 mg  2 mg Oral QHS Dorthea Gauze, NP   2 mg at 04/19/24 2309   ARIPiprazole  (ABILIFY ) tablet 2 mg  2 mg Oral Once Carrion-Carrero, Jacalyn Martin, MD       hydrOXYzine  (ATARAX ) tablet 25 mg  25 mg Oral TID Dorthea Gauze, NP   25 mg at 04/20/24 1057   magnesium  hydroxide (MILK OF MAGNESIA) suspension 30 mL  30 mL Oral Daily PRN Dorthea Gauze, NP       OLANZapine  (ZYPREXA ) injection 10 mg  10 mg Intramuscular TID PRN Dorthea Gauze, NP       OLANZapine  (ZYPREXA ) injection 5 mg  5 mg Intramuscular TID PRN Dorthea Gauze, NP       OLANZapine  zydis (ZYPREXA ) disintegrating tablet 5 mg  5 mg Oral TID PRN Dorthea Gauze, NP       traZODone  (DESYREL ) tablet 50 mg  50 mg Oral QHS PRN Dorthea Gauze, NP   50 mg at 04/19/24 2309   Current  Outpatient Medications  Medication Sig Dispense Refill   ARIPiprazole  (ABILIFY ) 5 MG tablet Take 1 tablet (5 mg total) by mouth daily. 4 tablet 0   hydrOXYzine  (ATARAX ) 25 MG tablet Take 1 tablet (25 mg total) by mouth 3 (three) times daily. 30 tablet 0   traZODone  (DESYREL ) 50 MG tablet Take 1 tablet (50 mg total) by mouth at bedtime as needed for sleep. 30 tablet 0    PTA Medications:  Facility Ordered Medications  Medication   acetaminophen  (TYLENOL ) tablet 650 mg   alum & mag hydroxide-simeth (MAALOX/MYLANTA) 200-200-20 MG/5ML suspension 30 mL   magnesium  hydroxide (MILK OF MAGNESIA) suspension 30 mL   OLANZapine  zydis (ZYPREXA ) disintegrating tablet 5 mg   OLANZapine  (ZYPREXA ) injection 5 mg   OLANZapine  (ZYPREXA ) injection 10 mg   hydrOXYzine  (ATARAX ) tablet 25 mg   traZODone  (DESYREL ) tablet 50 mg   ARIPiprazole  (ABILIFY ) tablet 2 mg   ARIPiprazole  (ABILIFY ) tablet 2 mg   PTA Medications  Medication Sig   ARIPiprazole  (ABILIFY ) 5 MG tablet Take 1 tablet (5 mg total) by mouth daily.   hydrOXYzine  (ATARAX ) 25 MG tablet Take 1 tablet (25 mg total) by mouth 3 (three) times daily.   traZODone  (DESYREL ) 50 MG tablet Take 1 tablet (50 mg total) by mouth at bedtime as  needed for sleep.       07/21/2022   10:47 AM 07/21/2022    5:34 AM 06/29/2022    3:08 PM  Depression screen PHQ 2/9  Decreased Interest 3 2 0  Down, Depressed, Hopeless 0 2 0  PHQ - 2 Score 3 4 0  Altered sleeping 0 1 0  Tired, decreased energy 2 1 0  Change in appetite 0 1 0  Feeling bad or failure about yourself  1 1 0  Trouble concentrating 0 1 1  Moving slowly or fidgety/restless 0 1 0  Suicidal thoughts  1 0  PHQ-9 Score 6 11 1   Difficult doing work/chores  Somewhat difficult Somewhat difficult    Flowsheet Row ED from 04/19/2024 in [facility] (Dis[facility]30/2023 in [facility]  C-SSRS RISK CATEGORY Low Risk Low Risk High Risk       Musculoskeletal  Strength & Muscle Tone: within normal limits Gait & Station: normal Patient leans: N/A  Psychiatric Specialty Exam  Presentation  General Appearance:  Appropriate for Environment  Eye Contact: Good  Speech: Clear and Coherent; Normal Rate  Speech Volume: Normal    Mood and Affect  Mood: -- ("So tired")  Affect: Congruent; Full Range   Thought Process  Thought Processes: Linear; Goal Directed  Descriptions of Associations:Intact  Orientation:Full (Time, Place and Person)  Thought Content:Logical  Diagnosis of Schizophrenia or Schizoaffective disorder in past: No    Hallucinations:Hallucinations: None  Ideas of Reference:None  Suicidal Thoughts:Suicidal Thoughts: Yes, Passive SI Passive Intent and/or Plan: Without Intent; Without Plan; Without Means to Carry Out; Without Access to Means  Homicidal Thoughts:Homicidal Thoughts: No   Sensorium  Memory: Immediate Good; Recent Good; Remote Good  Judgment: Fair  Insight: Fair   Art therapist  Concentration: Good  Attention Span: Good  Recall: Good  Fund of Knowledge: Good  Language: Good   Psychomotor Activity  Psychomotor Activity: Psychomotor Activity: Normal   Assets  Assets: Desire for Improvement; Resilience; Communication Skills   Sleep  Sleep: Sleep: Good (improving) Number of Hours of Sleep: 8   Nutritional Assessment (For OBS and FBC admissions only) Has the patient had a weight loss or gain of 10 pounds or more in the last 3 months?: No Has the patient had a decrease in food intake/or appetite?: No Does the patient have dental problems?: No Does the patient have eating habits or behaviors that may be indicators of an eating disorder including binging or inducing vomiting?: No Has the patient recently lost weight without trying?: 0 Has the patient been eating poorly  because of a decreased appetite?: 0 Malnutrition Screening Tool Score: 0    Physical Exam  Physical Exam Vitals and nursing note reviewed.  Constitutional:      General: She is not in acute distress.    Appearance: She is not ill-appearing.  HENT:     Head: Normocephalic and atraumatic.  Eyes:     Extraocular Movements: Extraocular movements intact.     Conjunctiva/sclera: Conjunctivae normal.  Pulmonary:     Effort: Pulmonary effort is normal. No respiratory distress.  Musculoskeletal:        General: Normal range of motion.  Skin:    General: Skin is warm and dry.  Neurological:     General: No focal deficit present.    Review of Systems  All other systems reviewed and are negative.  Blood  pressure 98/60, pulse 89, temperature 97.7 F (36.5 C), temperature source Oral, resp. rate 16, SpO2 99%. There is no height or weight on file to calculate BMI.  Demographic Factors:  Unemployed  Loss Factors: Legal issues  Historical Factors: Impulsivity  Risk Reduction Factors:   Living with another person, especially a relative and Positive social support  Continued Clinical Symptoms:  Unstable or Poor Therapeutic Relationship Previous Psychiatric Diagnoses and Treatments  Cognitive Features That Contribute To Risk:  None    Suicide Risk:  Mild:  Suicidal ideation of limited frequency, intensity, duration, and specificity.  There are no identifiable plans, no associated intent, mild dysphoria and related symptoms, good self-control (both objective and subjective assessment), few other risk factors, and identifiable protective factors, including available and accessible social support.  Plan Of Care/Follow-up recommendations:  Activity: as tolerated  Diet: heart healthy  Other: -Follow-up with your outpatient psychiatric provider -instructions on appointment date, time, and address (location) are provided to you in discharge paperwork.  -Take your psychiatric  medications as prescribed at discharge - instructions are provided to you in the discharge paperwork  -If you are prescribed an atypical antipsychotic medication, we recommend that your outpatient psychiatrist follow routine screening for side effects within 3 months of discharge, including monitoring: AIMS scale, height, weight, blood pressure, fasting lipid panel, HbA1c, and fasting blood sugar.   -Recommend total abstinence from alcohol, tobacco, and other illicit drug use at discharge.   -If your psychiatric symptoms recur, worsen, or if you have side effects to your psychiatric medications, call your outpatient psychiatric provider, 911, [phone] or go to the nearest emergency department.  -If suicidal thoughts occur, immediately call your outpatient psychiatric provider, 911, [phone] or go to the nearest emergency department.   Disposition: Home  Baltazar Bonier, MD 04/20/2024, 11:22 AM

## 2024-04-20 NOTE — ED Notes (Signed)
 Stable. A&O x 4.  Home .    Pt provided with paper prescriptions and pharmacy sample of trazodone .   Denies current SI plan and Intent.  Denies HI and A/V hallucinations.   All belongings returned to PT.   Discharge, follow up instructions, medication education provided  Pt verbalized understanding

## 2024-04-20 NOTE — Discharge Instructions (Addendum)
 Dear Tamara Guzman,  It was a pleasure to take care of you during your stay at [facility]) where you were treated for your depression and mood instability.   While you were here, you were:  observed and cared for by our nurses and nursing assistants  treated with medications by your psychiatrists  provided resources by our social workers and case managers  Please review the medication list provided to you at discharge and stop, start taking, or continue taking the medications listed there.  You should also follow-up with your primary care doctor, or start seeing one if you don't have one yet. If applicable, here are some scheduled follow-ups for you:    I recommend abstinence from alcohol, tobacco, and other illicit drug use.   If your psychiatric symptoms or suicidal thoughts recur, worsen, or if you have side effects to your psychiatric medications, call your outpatient psychiatric provider, 911, [phone] or go to the nearest emergency department.  You have a scheduled appointment for Monday, May 12th at 1:00 PM with Dicie Foster for medication management. Please arrive at least 15 minutes prior to your appointment.    Take care!  Signed: Baltazar Bonier, MD 04/20/2024, 10:45 AM  Naloxone (Narcan) can help reverse an overdose when given to the victim quickly.  [facility] offers free naloxone kits and instructions/training on its use.  Add naloxone to your first aid kit and you can help save a life. A prescription can be filled at your local pharmacy or free kits are provided by the county

## 2024-04-20 NOTE — ED Notes (Signed)
 Pt observed lying in bed guarded and irritable.   Denied current SI Plan and/or intent,  Denied HI and A/V hallucination.   Pt then covered part of face with blanket and did not wish to participate any longer

## 2024-04-20 NOTE — ED Notes (Signed)
 Pt observed/assessed in recliner sleeping. RR even and unlabored, appearing in no noted distress. Environmental check complete, will continue to monitor for safety

## 2024-04-20 NOTE — ED Notes (Signed)
 Approached patient to administer newly ordered medications, irritable stating"why do y'all keep bugging me"  Pt declined  Abilify 

## 2024-04-23 ENCOUNTER — Other Ambulatory Visit (HOSPITAL_COMMUNITY): Payer: Self-pay | Admitting: Psychiatry

## 2024-04-23 ENCOUNTER — Ambulatory Visit (HOSPITAL_COMMUNITY): Payer: MEDICAID | Admitting: Psychiatry

## 2024-04-23 DIAGNOSIS — F3112 Bipolar disorder, current episode manic without psychotic features, moderate: Secondary | ICD-10-CM

## 2024-04-23 MED ORDER — TRAZODONE HCL 50 MG PO TABS: 50.0000 mg | ORAL_TABLET | Freq: Every evening | ORAL | 3 refills | Status: DC | PRN

## 2024-04-23 MED ORDER — ARIPIPRAZOLE 5 MG PO TABS: 5.0000 mg | ORAL_TABLET | Freq: Every day | ORAL | 3 refills | Status: DC

## 2024-04-23 MED ORDER — HYDROXYZINE HCL 25 MG PO TABS: 25.0000 mg | ORAL_TABLET | Freq: Three times a day (TID) | ORAL | 3 refills | Status: DC

## 2024-04-23 NOTE — Telephone Encounter (Signed)
 Patient missed her appointment today.  Provider spoke to patient's significant other who reports that she is doing well when she takes her medication.  He however notes that her substance use interferes with her mental health.  Patient's significant other asked for resources for substance use treatment.  He was given a resource to [employer] [employer].  Patient's medications reordered and sent to preferred pharmacy.  Provider informed patient significant other to have her reschedule for reevaluation.  He endorsed understanding and agreed.

## 2024-05-06 ENCOUNTER — Ambulatory Visit (HOSPITAL_COMMUNITY)
Admission: EM | Admit: 2024-05-06 | Discharge: 2024-05-06 | Disposition: A | Discharge status: Other Acute Inpatient Hospital | Payer: MEDICAID | Attending: Psychiatry | Admitting: Psychiatry

## 2024-05-06 ENCOUNTER — Inpatient Hospital Stay (HOSPITAL_COMMUNITY)
Admission: AD | Admit: 2024-05-06 | Discharge: 2024-05-11 | DRG: 885 | Disposition: A | Discharge status: Home / Self Care | Payer: MEDICAID | Source: Intra-hospital | Attending: Psychiatry | Admitting: Psychiatry

## 2024-05-06 ENCOUNTER — Encounter (HOSPITAL_COMMUNITY): Payer: Self-pay | Admitting: Psychiatry

## 2024-05-06 DIAGNOSIS — F332 Major depressive disorder, recurrent severe without psychotic features: Principal | ICD-10-CM | POA: Diagnosis present

## 2024-05-06 DIAGNOSIS — Z6827 Body mass index (BMI) 27.0-27.9, adult: Secondary | ICD-10-CM | POA: Diagnosis not present

## 2024-05-06 DIAGNOSIS — F603 Borderline personality disorder: Secondary | ICD-10-CM | POA: Diagnosis present

## 2024-05-06 DIAGNOSIS — R45851 Suicidal ideations: Secondary | ICD-10-CM | POA: Diagnosis present

## 2024-05-06 DIAGNOSIS — G4700 Insomnia, unspecified: Secondary | ICD-10-CM | POA: Diagnosis present

## 2024-05-06 DIAGNOSIS — Z9151 Personal history of suicidal behavior: Secondary | ICD-10-CM

## 2024-05-06 DIAGNOSIS — F411 Generalized anxiety disorder: Secondary | ICD-10-CM | POA: Diagnosis present

## 2024-05-06 DIAGNOSIS — E669 Obesity, unspecified: Secondary | ICD-10-CM | POA: Diagnosis present

## 2024-05-06 DIAGNOSIS — F4310 Post-traumatic stress disorder, unspecified: Secondary | ICD-10-CM | POA: Diagnosis present

## 2024-05-06 DIAGNOSIS — F909 Attention-deficit hyperactivity disorder, unspecified type: Secondary | ICD-10-CM | POA: Insufficient documentation

## 2024-05-06 DIAGNOSIS — F3162 Bipolar disorder, current episode mixed, moderate: Principal | ICD-10-CM | POA: Diagnosis present

## 2024-05-06 DIAGNOSIS — Z79899 Other long term (current) drug therapy: Secondary | ICD-10-CM

## 2024-05-06 DIAGNOSIS — T7621XA Adult sexual abuse, suspected, initial encounter: Secondary | ICD-10-CM | POA: Diagnosis present

## 2024-05-06 DIAGNOSIS — R739 Hyperglycemia, unspecified: Secondary | ICD-10-CM | POA: Diagnosis present

## 2024-05-06 DIAGNOSIS — F3130 Bipolar disorder, current episode depressed, mild or moderate severity, unspecified: Secondary | ICD-10-CM | POA: Insufficient documentation

## 2024-05-06 DIAGNOSIS — Z5900 Homelessness unspecified: Secondary | ICD-10-CM | POA: Insufficient documentation

## 2024-05-06 DIAGNOSIS — Z91410 Personal history of adult physical and sexual abuse: Secondary | ICD-10-CM | POA: Insufficient documentation

## 2024-05-06 DIAGNOSIS — F17210 Nicotine dependence, cigarettes, uncomplicated: Secondary | ICD-10-CM | POA: Diagnosis present

## 2024-05-06 DIAGNOSIS — F3112 Bipolar disorder, current episode manic without psychotic features, moderate: Principal | ICD-10-CM | POA: Diagnosis present

## 2024-05-06 DIAGNOSIS — F17200 Nicotine dependence, unspecified, uncomplicated: Secondary | ICD-10-CM | POA: Insufficient documentation

## 2024-05-06 LAB — POCT URINE DRUG SCREEN - MANUAL ENTRY (I-SCREEN)
POC Amphetamine UR: POSITIVE — AB
POC Buprenorphine (BUP): NOT DETECTED
POC Cocaine UR: POSITIVE — AB
POC Marijuana UR: POSITIVE — AB
POC Methadone UR: NOT DETECTED
POC Methamphetamine UR: POSITIVE — AB
POC Morphine: NOT DETECTED
POC Oxazepam (BZO): NOT DETECTED
POC Oxycodone UR: NOT DETECTED
POC Secobarbital (BAR): NOT DETECTED

## 2024-05-06 LAB — CBC WITH DIFFERENTIAL/PLATELET
Abs Immature Granulocytes: 0.01 10*3/uL (ref 0.00–0.07)
Basophils Absolute: 0 10*3/uL (ref 0.0–0.1)
Basophils Relative: 1 %
Eosinophils Absolute: 0.3 10*3/uL (ref 0.0–0.5)
Eosinophils Relative: 7 %
HCT: 43 % (ref 36.0–46.0)
Hemoglobin: 13.6 g/dL (ref 12.0–15.0)
Immature Granulocytes: 0 %
Lymphocytes Relative: 30 %
Lymphs Abs: 1.3 10*3/uL (ref 0.7–4.0)
MCH: 26.1 pg (ref 26.0–34.0)
MCHC: 31.6 g/dL (ref 30.0–36.0)
MCV: 82.5 fL (ref 80.0–100.0)
Monocytes Absolute: 0.4 10*3/uL (ref 0.1–1.0)
Monocytes Relative: 10 %
Neutro Abs: 2.2 10*3/uL (ref 1.7–7.7)
Neutrophils Relative %: 52 %
Platelets: 255 10*3/uL (ref 150–400)
RBC: 5.21 MIL/uL — ABNORMAL HIGH (ref 3.87–5.11)
RDW: 14 % (ref 11.5–15.5)
WBC: 4.3 10*3/uL (ref 4.0–10.5)
nRBC: 0 % (ref 0.0–0.2)

## 2024-05-06 LAB — COMPREHENSIVE METABOLIC PANEL WITH GFR
ALT: 9 U/L (ref 0–44)
AST: 22 U/L (ref 15–41)
Albumin: 3.9 g/dL (ref 3.5–5.0)
Alkaline Phosphatase: 57 U/L (ref 38–126)
Anion gap: 8 (ref 5–15)
BUN: 11 mg/dL (ref 6–20)
CO2: 27 mmol/L (ref 22–32)
Calcium: 8.9 mg/dL (ref 8.9–10.3)
Chloride: 103 mmol/L (ref 98–111)
Creatinine, Ser: 0.96 mg/dL (ref 0.44–1.00)
GFR, Estimated: >60 mL/min (ref >60)
Glucose, Bld: 111 mg/dL — ABNORMAL HIGH (ref 70–99)
Potassium: 4 mmol/L (ref 3.5–5.1)
Sodium: 138 mmol/L (ref 135–145)
Total Bilirubin: 0.2 mg/dL (ref 0.0–1.2)
Total Protein: 7 g/dL (ref 6.5–8.1)

## 2024-05-06 LAB — POC URINE PREG, ED: Preg Test, Ur: NEGATIVE

## 2024-05-06 LAB — HEMOGLOBIN A1C
Hgb A1c MFr Bld: 4.9 % (ref 4.8–5.6)
Mean Plasma Glucose: 93.93 mg/dL

## 2024-05-06 LAB — LIPID PANEL
Cholesterol: 132 mg/dL (ref 0–200)
HDL: 50 mg/dL (ref >40)
LDL Cholesterol: 67 mg/dL (ref 0–99)
Total CHOL/HDL Ratio: 2.6 ratio
Triglycerides: 77 mg/dL (ref <150)
VLDL: 15 mg/dL (ref 0–40)

## 2024-05-06 LAB — TSH: TSH: 0.863 u[IU]/mL (ref 0.350–4.500)

## 2024-05-06 LAB — ETHANOL: Alcohol, Ethyl (B): <15 mg/dL (ref <15)

## 2024-05-06 MED ORDER — MAGNESIUM HYDROXIDE 400 MG/5ML PO SUSP: 30.0000 mL | Freq: Every day | ORAL | Status: DC | PRN

## 2024-05-06 MED ORDER — HYDROXYZINE HCL 25 MG PO TABS
25.0000 mg | ORAL_TABLET | Freq: Three times a day (TID) | ORAL | Status: DC | PRN
  Administered 2024-05-06 (×2): 25 mg via ORAL
  Filled 2024-05-06 (×2): qty 1

## 2024-05-06 MED ORDER — HALOPERIDOL 5 MG PO TABS: 5.0000 mg | ORAL_TABLET | Freq: Three times a day (TID) | ORAL | Status: DC | PRN

## 2024-05-06 MED ORDER — DIPHENHYDRAMINE HCL 50 MG/ML IJ SOLN: 50.0000 mg | Freq: Three times a day (TID) | INTRAMUSCULAR | Status: DC | PRN

## 2024-05-06 MED ORDER — MENTHOL 3 MG MT LOZG
1.0000 | LOZENGE | Freq: Once | OROMUCOSAL | Status: AC
  Administered 2024-05-06: 3 mg via ORAL
  Filled 2024-05-06: qty 9

## 2024-05-06 MED ORDER — DIPHENHYDRAMINE HCL 25 MG PO CAPS: 50.0000 mg | ORAL_CAPSULE | Freq: Three times a day (TID) | ORAL | Status: DC | PRN

## 2024-05-06 MED ORDER — ACETAMINOPHEN 325 MG PO TABS: 650.0000 mg | ORAL_TABLET | Freq: Four times a day (QID) | ORAL | Status: DC | PRN

## 2024-05-06 MED ORDER — ALUM & MAG HYDROXIDE-SIMETH 200-200-20 MG/5ML PO SUSP: 30.0000 mL | ORAL | Status: DC | PRN

## 2024-05-06 MED ORDER — ARIPIPRAZOLE 5 MG PO TABS
5.0000 mg | ORAL_TABLET | Freq: Every day | ORAL | Status: DC
  Administered 2024-05-06: 5 mg via ORAL
  Filled 2024-05-06: qty 1

## 2024-05-06 MED ORDER — HALOPERIDOL LACTATE 5 MG/ML IJ SOLN: 10.0000 mg | Freq: Three times a day (TID) | INTRAMUSCULAR | Status: DC | PRN

## 2024-05-06 MED ORDER — HYDROXYZINE HCL 25 MG PO TABS
25.0000 mg | ORAL_TABLET | Freq: Three times a day (TID) | ORAL | Status: DC | PRN
  Administered 2024-05-07 – 2024-05-08 (×2): 25 mg via ORAL
  Filled 2024-05-06 (×2): qty 1

## 2024-05-06 MED ORDER — HALOPERIDOL LACTATE 5 MG/ML IJ SOLN: 5.0000 mg | Freq: Three times a day (TID) | INTRAMUSCULAR | Status: DC | PRN

## 2024-05-06 MED ORDER — DIPHENHYDRAMINE HCL 50 MG PO CAPS: 50.0000 mg | ORAL_CAPSULE | Freq: Three times a day (TID) | ORAL | Status: DC | PRN

## 2024-05-06 MED ORDER — LORAZEPAM 2 MG/ML IJ SOLN: 2.0000 mg | Freq: Three times a day (TID) | INTRAMUSCULAR | Status: DC | PRN

## 2024-05-06 MED ORDER — TRAZODONE HCL 50 MG PO TABS
50.0000 mg | ORAL_TABLET | Freq: Every evening | ORAL | Status: DC | PRN
  Administered 2024-05-07 – 2024-05-08 (×2): 50 mg via ORAL
  Filled 2024-05-06 (×2): qty 1

## 2024-05-06 MED ORDER — TRAZODONE HCL 50 MG PO TABS
50.0000 mg | ORAL_TABLET | Freq: Every evening | ORAL | Status: DC | PRN
  Administered 2024-05-06 (×2): 50 mg via ORAL
  Filled 2024-05-06 (×2): qty 1

## 2024-05-06 NOTE — ED Notes (Signed)
 Pt observed/assessed in recliner sleeping. RR even and unlabored, appearing in no noted distress. Environmental check complete, will continue to monitor for safety
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
## 2024-05-06 NOTE — ED Notes (Signed)
 Pt has been less irritable as shift continued.  Pt has inpatient bed at [facility] after 2000 tonight.  Pt is aware of POC.  Pt verbalized understanding and is has no issues with the plan. Hourly observations continue for safety

## 2024-05-06 NOTE — ED Notes (Signed)
 Pt admitted and oriented to unit. States she feels better now that she has been admitted. She denies SI/ HI/AVH but contracts for safety. She is pleasant and cooperative with staff at present time. HS meds given and food and juice. No noted distress. Will contine to monitor for safety

## 2024-05-06 NOTE — ED Notes (Signed)
 Pt observed lying in bed. Eyes closed respirations even and non labored. NAD  Hourly observations continue for safety.
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
## 2024-05-06 NOTE — ED Notes (Signed)
 Report called to Paola Bohr RN @ [facility]
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
## 2024-05-06 NOTE — ED Notes (Signed)
 Non productive cough noted. Pt states " I am sick can they give me something for my cough"? Provider notified, see new orders.

## 2024-05-06 NOTE — ED Notes (Signed)
 Pt sleeping comfortable in bed. Safe transport here to pick pt to [facility]. All assessments remained unchanged prior.

## 2024-05-06 NOTE — ED Notes (Addendum)
 Pt provided breakfast at this time  Difficult to engage in conversation.  Pt turned over a closed her eyes.  No longer wanting to speak/

## 2024-05-06 NOTE — ED Notes (Signed)
 Pt on bed calm and composed. NAD. Respirations even and unlabored. Will keep monitoring for safety.

## 2024-05-07 ENCOUNTER — Encounter (HOSPITAL_COMMUNITY): Payer: Self-pay

## 2024-05-07 ENCOUNTER — Other Ambulatory Visit: Payer: Self-pay

## 2024-05-07 DIAGNOSIS — F332 Major depressive disorder, recurrent severe without psychotic features: Secondary | ICD-10-CM | POA: Diagnosis not present

## 2024-05-07 LAB — VITAMIN D 25 HYDROXY (VIT D DEFICIENCY, FRACTURES): Vit D, 25-Hydroxy: 25.91 ng/mL — ABNORMAL LOW (ref 30–100)

## 2024-05-07 MED ORDER — ARIPIPRAZOLE 15 MG PO TABS
15.0000 mg | ORAL_TABLET | Freq: Every day | ORAL | Status: DC
Start: 2024-05-07 — End: 2024-05-11
  Administered 2024-05-07 – 2024-05-11 (×5): 15 mg via ORAL
  Filled 2024-05-07 (×5): qty 1

## 2024-05-07 MED ORDER — ARIPIPRAZOLE ER 400 MG IM SRER
400.0000 mg | Freq: Once | INTRAMUSCULAR | Status: DC
  Filled 2024-05-07: qty 2

## 2024-05-07 NOTE — Plan of Care (Signed)
   Problem: Education: Goal: Knowledge of [place] [facility] Education information/materials will improve Outcome: Progressing

## 2024-05-07 NOTE — Progress Notes (Signed)
   05/07/24 0800  Psych Admission Type (Psych Patients Only)  Admission Status Voluntary  Psychosocial Assessment  Patient Complaints Anger;Depression  Eye Contact Fair  Facial Expression Grimacing  Affect Preoccupied  Speech Logical/coherent  Interaction Minimal;Poor;Hostile  Motor Activity Unsteady  Appearance/Hygiene In scrubs  Behavior Characteristics Appropriate to situation  Mood Irritable;Anxious;Labile  Thought Process  Coherency WDL  Content WDL  Delusions None reported or observed  Perception WDL  Hallucination None reported or observed  Judgment Impaired  Confusion None  Danger to Self  Current suicidal ideation? Denies  Self-Injurious Behavior No self-injurious ideation or behavior indicators observed or expressed   Agreement Not to Harm Self Yes  Description of Agreement verbal  Danger to Others  Danger to Others None reported or observed

## 2024-05-07 NOTE — Plan of Care (Signed)
   Problem: Activity: Goal: Interest or engagement in activities will improve Outcome: Progressing   Problem: Coping: Goal: Ability to verbalize frustrations and anger appropriately will improve Outcome: Progressing   Problem: Safety: Goal: Periods of time without injury will increase Outcome: Progressing

## 2024-05-07 NOTE — Progress Notes (Signed)
   05/07/24 0525  15 Minute Checks  Location Bedroom  Visual Appearance Calm  Behavior Sleeping  Sleep (Behavioral Health Patients Only)  Calculate sleep? (Click Yes once per 24 hr at 0600 safety check) Yes  Documented sleep last 24 hours 5.5

## 2024-05-07 NOTE — [facility] Group Notes (Signed)
 [facility] Group Notes:  (Nursing/MHT/Case Management/Adjunct)  Date:  05/07/2024  Time:  9:26 PM  Type of Therapy:  Psychoeducational Skills  Participation Level:  Minimal  Participation Quality:  Attentive  Affect:  Appropriate  Cognitive:  Appropriate  Insight:  Limited  Engagement in Group:  Limited  Modes of Intervention:  Education  Summary of Progress/Problems: Patient attended the evening A.A. speaker"s meeting and was appropriate.   Sicily Zaragoza S 05/07/2024, 9:26 PM

## 2024-05-07 NOTE — H&P (Signed)
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 reduce recurrence of condition will improve, Ability to verbalize feelings will improve, Ability to disclose and discuss suicidal ideas, Ability to demonstrate self-control will improve, Ability to identify and develop effective coping behaviors will improve, Ability to maintain clinical measurements within normal limits will improve, Compliance with prescribed medications will improve, and Ability to identify triggers associated with substance abuse/mental health issues will improve  Physician Treatment Plan  for Secondary Diagnosis: Principal Problem:   MDD (major depressive disorder), recurrent episode, severe (HCC)  I certify that inpatient services furnished can reasonably be expected to improve the patient's condition.    Tamara Breese C Ernan Runkles, FNP 5/26/20259:17 AM

## 2024-05-07 NOTE — Progress Notes (Signed)
 Pt barely able to stay awake, had Trazodone  and Vistaril  two hours before transport.  Pt could not provide very much information.  She was cooperative under the circumstances.     05/06/24 2358  Psych Admission Type (Psych Patients Only)  Admission Status Voluntary  Psychosocial Assessment  Patient Complaints Self-harm thoughts;Depression  Eye Contact Fair  Facial Expression Other (Comment) (sleepy)  Affect Preoccupied  Speech Logical/coherent  Interaction Minimal  Motor Activity Unsteady (Pt given sleeping medication and anti anxiety medication before transport, barely awake)  Behavior Characteristics Appropriate to situation  Mood Irritable  Thought Process  Coherency WDL  Content WDL  Delusions None reported or observed  Perception WDL  Hallucination None reported or observed  Judgment Impaired  Confusion None  Danger to Self  Current suicidal ideation? Denies  Self-Injurious Behavior No self-injurious ideation or behavior indicators observed or expressed   Agreement Not to Harm Self Yes  Description of Agreement verbal  Danger to Others  Danger to Others None reported or observed

## 2024-05-07 NOTE — Tx Team (Signed)
 Initial Treatment Plan 05/07/2024 12:09 AM [name] WUJ:[id number]    PATIENT STRESSORS: Financial difficulties   Marital or family conflict   Medication change or noncompliance   Substance abuse   Traumatic event     PATIENT STRENGTHS: Average or above average intelligence  Communication skills  Physical Health  Religious Affiliation  Work skills    PATIENT IDENTIFIED PROBLEMS: Depression  Self harm thoughts  Substance abuse                 DISCHARGE CRITERIA:  Adequate post-discharge living arrangements Improved stabilization in mood, thinking, and/or behavior Motivation to continue treatment in a less acute level of care Need for constant or close observation no longer present Verbal commitment to aftercare and medication compliance  PRELIMINARY DISCHARGE PLAN: Outpatient therapy Return to previous work or school arrangements  PATIENT/FAMILY INVOLVEMENT: This treatment plan has been presented to and reviewed with the patient, Tamara Guzman.  The patient and family have been given the opportunity to ask questions and make suggestions.  Delaney Fearing, RN 05/07/2024, 12:09 AM
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
## 2024-05-07 NOTE — BH IP Treatment Plan (Signed)
 Interdisciplinary Treatment and Diagnostic Plan Update  05/07/2024 Time of Session: 1127 Tamara Guzman MRN: [id number]  Principal Diagnosis: MDD (major depressive disorder), recurrent episode, severe (HCC)  Secondary Diagnoses: Principal Problem:   MDD (major depressive disorder), recurrent episode, severe (HCC)   Current Medications:  Current Facility-Administered Medications  Medication Dose Route Frequency Provider Last Rate Last Admin   acetaminophen  (TYLENOL ) tablet 650 mg  650 mg Oral Q6H PRN McLauchlin, Angela, NP       alum & mag hydroxide-simeth (MAALOX/MYLANTA) 200-200-20 MG/5ML suspension 30 mL  30 mL Oral Q4H PRN McLauchlin, Angela, NP       ARIPiprazole  (ABILIFY ) tablet 15 mg  15 mg Oral Daily Parker, Alvin S, MD   15 mg at 05/07/24 1135   [START ON 05/08/2024] ARIPiprazole  ER (ABILIFY  MAINTENA) injection 400 mg  400 mg Intramuscular Once Parker, Alvin S, MD       haloperidol  (HALDOL ) tablet 5 mg  5 mg Oral TID PRN McLauchlin, Angela, NP       And   diphenhydrAMINE  (BENADRYL ) capsule 50 mg  50 mg Oral TID PRN McLauchlin, Angela, NP       haloperidol  lactate (HALDOL ) injection 5 mg  5 mg Intramuscular TID PRN McLauchlin, Angela, NP       And   diphenhydrAMINE  (BENADRYL ) injection 50 mg  50 mg Intramuscular TID PRN McLauchlin, Angela, NP       And   LORazepam  (ATIVAN ) injection 2 mg  2 mg Intramuscular TID PRN McLauchlin, Shelvy Dickens, NP       haloperidol  lactate (HALDOL ) injection 10 mg  10 mg Intramuscular TID PRN McLauchlin, Angela, NP       And   diphenhydrAMINE  (BENADRYL ) injection 50 mg  50 mg Intramuscular TID PRN McLauchlin, Shelvy Dickens, NP       And   LORazepam  (ATIVAN ) injection 2 mg  2 mg Intramuscular TID PRN McLauchlin, Angela, NP       hydrOXYzine  (ATARAX ) tablet 25 mg  25 mg Oral TID PRN McLauchlin, Angela, NP       magnesium  hydroxide (MILK OF MAGNESIA) suspension 30 mL  30 mL Oral Daily PRN McLauchlin, Angela, NP       traZODone  (DESYREL ) tablet 50 mg  50 mg  Oral QHS PRN McLauchlin, Angela, NP       PTA Medications: Medications Prior to Admission  Medication Sig Dispense Refill Last Dose/Taking   ARIPiprazole  (ABILIFY ) 5 MG tablet Take 1 tablet (5 mg total) by mouth daily. (Patient taking differently: Take 15 mg by mouth daily.) 30 tablet 3    hydrOXYzine  (ATARAX ) 25 MG tablet Take 1 tablet (25 mg total) by mouth 3 (three) times daily. 90 tablet 3    traZODone  (DESYREL ) 50 MG tablet Take 1 tablet (50 mg total) by mouth at bedtime as needed for sleep. 30 tablet 3     Patient Stressors: Financial difficulties   Marital or family conflict   Medication change or noncompliance   Substance abuse   Traumatic event    Patient Strengths: Average or above average intelligence  Communication skills  Physical Health  Religious Affiliation  Work skills   Treatment Modalities: Medication Management, Group therapy, Case management,  1 to 1 session with clinician, Psychoeducation, Recreational therapy.   Physician Treatment Plan for Primary Diagnosis: MDD (major depressive disorder), recurrent episode, severe (HCC) Long Term Goal(s): Improvement in symptoms so as ready for discharge   Short Term Goals: Ability to identify changes in lifestyle to reduce recurrence of condition will  improve Ability to verbalize feelings will improve Ability to disclose and discuss suicidal ideas Ability to demonstrate self-control will improve Ability to identify and develop effective coping behaviors will improve Ability to maintain clinical measurements within normal limits will improve Compliance with prescribed medications will improve Ability to identify triggers associated with substance abuse/mental health issues will improve  Medication Management: Evaluate patient's response, side effects, and tolerance of medication regimen.  Therapeutic Interventions: 1 to 1 sessions, Unit Group sessions and Medication administration.  Evaluation of Outcomes: Not  Progressing  Physician Treatment Plan for Secondary Diagnosis: Principal Problem:   MDD (major depressive disorder), recurrent episode, severe (HCC)  Long Term Goal(s): Improvement in symptoms so as ready for discharge   Short Term Goals: Ability to identify changes in lifestyle to reduce recurrence of condition will improve Ability to verbalize feelings will improve Ability to disclose and discuss suicidal ideas Ability to demonstrate self-control will improve Ability to identify and develop effective coping behaviors will improve Ability to maintain clinical measurements within normal limits will improve Compliance with prescribed medications will improve Ability to identify triggers associated with substance abuse/mental health issues will improve     Medication Management: Evaluate patient's response, side effects, and tolerance of medication regimen.  Therapeutic Interventions: 1 to 1 sessions, Unit Group sessions and Medication administration.  Evaluation of Outcomes: Not Progressing   RN Treatment Plan for Primary Diagnosis: MDD (major depressive disorder), recurrent episode, severe (HCC) Long Term Goal(s): Knowledge of disease and therapeutic regimen to maintain health will improve  Short Term Goals: Ability to remain free from injury will improve, Ability to verbalize frustration and anger appropriately will improve, Ability to demonstrate self-control, Ability to participate in decision making will improve, Ability to verbalize feelings will improve, Ability to disclose and discuss suicidal ideas, Ability to identify and develop effective coping behaviors will improve, and Compliance with prescribed medications will improve  Medication Management: RN will administer medications as ordered by provider, will assess and evaluate patient's response and provide education to patient for prescribed medication. RN will report any adverse and/or side effects to prescribing  provider.  Therapeutic Interventions: 1 on 1 counseling sessions, Psychoeducation, Medication administration, Evaluate responses to treatment, Monitor vital signs and CBGs as ordered, Perform/monitor CIWA, COWS, AIMS and Fall Risk screenings as ordered, Perform wound care treatments as ordered.  Evaluation of Outcomes: Not Progressing   LCSW Treatment Plan for Primary Diagnosis: MDD (major depressive disorder), recurrent episode, severe (HCC) Long Term Goal(s): Safe transition to appropriate next level of care at discharge, Engage patient in therapeutic group addressing interpersonal concerns.  Short Term Goals: Engage patient in aftercare planning with referrals and resources, Increase social support, Increase ability to appropriately verbalize feelings, Increase emotional regulation, Facilitate acceptance of mental health diagnosis and concerns, Facilitate patient progression through stages of change regarding substance use diagnoses and concerns, and Increase skills for wellness and recovery  Therapeutic Interventions: Assess for all discharge needs, 1 to 1 time with Social worker, Explore available resources and support systems, Assess for adequacy in community support network, Educate family and significant other(s) on suicide prevention, Complete Psychosocial Assessment, Interpersonal group therapy.  Evaluation of Outcomes: Not Progressing   Progress in Treatment: Attending groups: Yes. Participating in groups: Yes. Taking medication as prescribed: Yes. Toleration medication: Yes. Family/Significant other contact made: No, will contact:  PSA & consents pending Patient understands diagnosis: Yes. Discussing patient identified problems/goals with staff: Yes. Medical problems stabilized or resolved: Yes. Denies suicidal/homicidal ideation: Yes. Issues/concerns per patient  self-inventory: No. Other: n/a  New problem(s) identified: No, Describe:  None  New Short Term/Long Term  Goal(s): medication stabilization, elimination of SI thoughts, development of comprehensive mental wellness plan.   Patient Goals: "Get my mind together and really think"  Discharge Plan or Barriers: Patient recently admitted. CSW will continue to follow and assess for appropriate referrals and possible discharge planning.    Reason for Continuation of Hospitalization: Anxiety Medication stabilization Other; describe mood stabilization and discharge planning  Estimated Length of Stay: 3-5 DAYS  Last 3 [place] Suicide Severity Risk Score: Flowsheet Row [facility]Current[facility] Most recent reading at 05/06/2024 11:40 PM ED from 05/06/2024 in [facility] Most recent reading at 05/06/2024  2:13 AM ED from 04/19/2024 in [facility] Most recent reading at 04/20/2024  2:50 AM  C-SSRS RISK CATEGORY Moderate Risk Moderate Risk Low Risk       Last PHQ 2/9 Scores:    07/21/2022   10:47 AM 07/21/2022    5:34 AM 06/29/2022    3:08 PM  Depression screen PHQ 2/9  Decreased Interest 3 2 0  Down, Depressed, Hopeless 0 2 0  PHQ - 2 Score 3 4 0  Altered sleeping 0 1 0  Tired, decreased energy 2 1 0  Change in appetite 0 1 0  Feeling bad or failure about yourself  1 1 0  Trouble concentrating 0 1 1  Moving slowly or fidgety/restless 0 1 0  Suicidal thoughts  1 0  PHQ-9 Score 6 11 1   Difficult doing work/chores  Somewhat difficult Somewhat difficult    Scribe for Treatment Team: Vernestine Gondola, LCSW 05/07/2024 5:21 PM

## 2024-05-07 NOTE — Group Note (Signed)
 Recreation Therapy Group Note   Group Topic:Leisure Education  Group Date: 05/07/2024 Start Time: 0932 End Time: 1018 Facilitators: Betzayda Braxton-McCall, LRT,CTRS Location: [address] Dayroom   Group Topic: Leisure Education   Goal Area(s) Addresses:  Patient will successfully identify positive leisure and recreation activities.  Patient will acknowledge benefits of participation in healthy leisure activities post discharge.  Patient will actively work with peers toward a shared goal.   Behavioral Response:    Intervention: Competitive Group Game   Activity: Guess the [employer]. The game was broken up into 6 categories (Pop, [place], [place], Hip-Hop, R&B and Dance). The first team would use the spinner to land one of the music categories. Which ever category the spinner landed on, that group would read the lyrics and opposing team had to fill in the blank. If they answered correctly, they kept the card. The team with the most cards at the end, wins the game. Post-activity discussion reviewed benefits of positive recreation outlets: reducing stress, improving coping mechanisms, increasing self-esteem, and building larger support systems.   Education:  Teacher, English as a foreign language, Stress Management, Discharge Planning  Education Outcome: Acknowledges education/In group clarification offered/Needs additional education   Affect/Mood: N/A   Participation Level: Did not attend    Clinical Observations/Individualized Feedback:     Plan: Continue to engage patient in RT group sessions 2-3x/week.   Tamara Guzman, LRT,CTRS 05/07/2024 1:39 PM

## 2024-05-08 DIAGNOSIS — F332 Major depressive disorder, recurrent severe without psychotic features: Secondary | ICD-10-CM

## 2024-05-08 MED ORDER — VITAMIN D (ERGOCALCIFEROL) 1.25 MG (50000 UNIT) PO CAPS
50000.0000 [IU] | ORAL_CAPSULE | Freq: Every day | ORAL | Status: DC
  Administered 2024-05-08 – 2024-05-11 (×3): 50000 [IU] via ORAL
  Filled 2024-05-08 (×3): qty 1

## 2024-05-08 NOTE — Plan of Care (Signed)
  Problem: Education: Goal: Emotional status will improve Outcome: Progressing Goal: Verbalization of understanding the information provided will improve Outcome: Progressing   Problem: Activity: Goal: Sleeping patterns will improve Outcome: Progressing   Problem: Coping: Goal: Ability to demonstrate self-control will improve Outcome: Progressing

## 2024-05-08 NOTE — Group Note (Signed)
 LCSW Group Therapy Note   Group Date: 05/08/2024 Start Time: 1100 End Time: 1200  Participation:  did not attend  Type of Therapy:  Group Therapy  Topic:  "Money Matters: Ecologist, Confidence and Peace of Mind"  Objective: To help participants understand the impact of financial stability on well-being through the lens of Maslow's Hierarchy of Needs and develop practical strategies for budgeting, saving, and debt repayment.  Goals: Increase awareness of spending habits and financial priorities, recognizing how money supports basic needs, security, and relationships. Develop simple budgeting and saving strategies to enhance stability and peace of mind.  Reduce financial stress by creating a realistic debt repayment plan, supporting long-term confidence and well-being.  Summary:  Participants explored how financial stability connects to basic needs, relationships, and self-esteem using Maslow's Hierarchy. They discussed budgeting, saving, and debt repayment strategies, identifying small, manageable changes. Through interactive discussion and self-reflection, they gained insight into their financial habits and created personal action steps for improvement.  Therapeutic Modalities Used: Elements of Cognitive Behavioral Therapy (CBT) - Addressing financial stress and thought patterns. Psychoeducation - Engineer, agricultural. Elements of Motivational Interviewing (MI) - Encouraging realistic, achievable changes. Group Support - Reducing shame and stress through shared experiences.   Paisyn Guercio O Cheryle Dark, LCSWA 05/08/2024  4:33 PM

## 2024-05-08 NOTE — Progress Notes (Signed)
   05/07/24 2242  Psych Admission Type (Psych Patients Only)  Admission Status Voluntary  Psychosocial Assessment  Patient Complaints Irritability;Anxiety;Depression  Eye Contact Fair  Facial Expression Grimacing  Affect Anxious;Irritable  Speech Logical/coherent  Interaction Minimal;Forwards little  Motor Activity Other (Comment) (wdl)  Appearance/Hygiene Body odor;Poor hygiene;In scrubs  Behavior Characteristics Appropriate to situation  Mood Anxious;Irritable  Thought Process  Coherency WDL  Content WDL  Delusions None reported or observed  Perception WDL  Hallucination None reported or observed  Judgment Impaired  Confusion None  Danger to Self  Current suicidal ideation? Denies  Self-Injurious Behavior No self-injurious ideation or behavior indicators observed or expressed   Agreement Not to Harm Self Yes  Description of Agreement verbal  Danger to Others  Danger to Others None reported or observed

## 2024-05-08 NOTE — Plan of Care (Signed)
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
## 2024-05-08 NOTE — Progress Notes (Signed)
 Patient refused ordered aripiprazole  ER. Patient exclaimed "I ain't taking no damn shot." This writer reiterated the benefits of the injection. Patient stated "I'll take the pill every day."
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
## 2024-05-08 NOTE — Group Note (Signed)
 Recreation Therapy Group Note   Group Topic:Animal Assisted Therapy   Group Date: 05/08/2024 Start Time: 1610 End Time: 1030 Facilitators: Laken Rog-McCall, LRT,CTRS Location: [address]   Animal-Assisted Activity (AAA) Program Checklist/Progress Notes Patient Eligibility Criteria Checklist & Daily Group note for Rec Tx Intervention  AAA/T Program Assumption of Risk Form signed by Patient/ or Parent Legal Guardian Yes  Patient understands his/her participation is voluntary Yes  Behavioral Response:    Education: Charity fundraiser, Appropriate Animal Interaction   Education Outcome: Acknowledges education.    Affect/Mood: N/A   Participation Level: Did not attend    Clinical Observations/Individualized Feedback:     Plan: Continue to engage patient in RT group sessions 2-3x/week.   Charde Macfarlane-McCall, LRT,CTRS 05/08/2024 11:29 AM

## 2024-05-08 NOTE — Progress Notes (Signed)
   05/08/24 1000  Psych Admission Type (Psych Patients Only)  Admission Status Voluntary  Psychosocial Assessment  Patient Complaints Irritability  Eye Contact Fair  Facial Expression Anxious;Pained  Affect Anxious;Irritable  Speech Argumentative  Interaction Minimal;Poor;Hostile  Motor Activity Other (Comment) (WNL)  Appearance/Hygiene Body odor;Poor hygiene  Behavior Characteristics Irritable;Anxious  Mood Anxious;Irritable  Thought Process  Coherency WDL  Content WDL  Delusions None reported or observed  Perception WDL  Hallucination None reported or observed  Judgment Impaired  Confusion None  Danger to Self  Current suicidal ideation? Denies  Agreement Not to Harm Self Yes  Description of Agreement verbal  Danger to Others  Danger to Others None reported or observed

## 2024-05-08 NOTE — Group Note (Signed)
 Date:  05/08/2024 Time:  9:13 AM  Group Topic/Focus:  Goals Group:   The focus of this group is to help patients establish daily goals to achieve during treatment and discuss how the patient can incorporate goal setting into their daily lives to aide in recovery. Orientation:   The focus of this group is to educate the patient on the purpose and policies of crisis stabilization and provide a format to answer questions about their admission.  The group details unit policies and expectations of patients while admitted.    Participation Level:  Did Not Attend

## 2024-05-08 NOTE — Group Note (Signed)
 Date:  05/08/2024 Time:  8:36 PM  Group Topic/Focus:  Wrap-Up Group:   The focus of this group is to help patients review their daily goal of treatment and discuss progress on daily workbooks.    Participation Level:  Did Not Attend  Participation Quality:  N/A  Affect:  N/A  Cognitive:  N/A  Insight: None  Engagement in Group:  N/A  Modes of Intervention:  N/A  Additional Comments:  Patient did not attend wrap up group.   Dillard Frame 05/08/2024, 8:36 PM

## 2024-05-09 DIAGNOSIS — F332 Major depressive disorder, recurrent severe without psychotic features: Secondary | ICD-10-CM | POA: Diagnosis not present

## 2024-05-09 NOTE — Group Note (Signed)
 Date:  05/09/2024 Time:  9:18 PM  Group Topic/Focus:  Wrap-Up Group:   The focus of this group is to help patients review their daily goal of treatment and discuss progress on daily workbooks.    Participation Level:  Did Not Attend  Participation Quality:  N/A  Affect:  N/A  Cognitive:  N/A  Insight: None  Engagement in Group:  N/A  Modes of Intervention:  N/A  Additional Comments:  Patient did not attend wrap up group.  Dillard Frame 05/09/2024, 9:18 PM

## 2024-05-09 NOTE — Group Note (Signed)
 Recreation Therapy Group Note   Group Topic:Communication  Group Date: 05/09/2024 Start Time: 0945 End Time: 1009 Facilitators: Michala Deblanc-McCall, LRT,CTRS Location: [address]   Group Topic: Communication, Problem Solving   Goal Area(s) Addresses:  Patient will effectively listen to complete activity.  Patient will identify communication skills used to make activity successful.  Patient will identify how skills used during activity can be used to reach post d/c goals.    Behavioral Response:   Intervention: Building surveyor Activity - Geometric pattern cards, pencils, blank paper    Activity: Geometric Drawings.  Three volunteers from the peer group will be shown an abstract picture with a particular arrangement of geometrical shapes.  Each round, one 'speaker' will describe the pattern, as accurately as possible without revealing the image to the group.  The remaining group members will listen and draw the picture to reflect how it is described to them. Patients with the role of 'listener' cannot ask clarifying questions but, may request that the speaker repeat a direction. Once the drawings are complete, the presenter will show the rest of the group the picture and compare how close each person came to drawing the picture. LRT will facilitate a post-activity discussion regarding effective communication and the importance of planning, listening, and asking for clarification in daily interactions with others.  Education: Environmental consultant, Active listening, Support systems, Discharge planning  Education Outcome: Acknowledges understanding/In group clarification offered/Needs additional education.    Affect/Mood: N/A   Participation Level: Did not attend    Clinical Observations/Individualized Feedback:      Plan: Continue to engage patient in RT group sessions 2-3x/week.   Izza Bickle-McCall, LRT,CTRS 05/09/2024 12:18 PM

## 2024-05-09 NOTE — Progress Notes (Signed)
   05/09/24 0845  Psych Admission Type (Psych Patients Only)  Admission Status Voluntary  Psychosocial Assessment  Patient Complaints Anxiety  Eye Contact Fair  Facial Expression Animated  Affect Anxious  Speech Logical/coherent  Interaction Poor  Motor Activity Slow  Appearance/Hygiene Poor hygiene;Body odor  Behavior Characteristics Appropriate to situation  Mood Anxious  Thought Process  Coherency WDL  Content WDL  Delusions None reported or observed  Perception WDL  Hallucination None reported or observed  Judgment Impaired  Confusion None  Danger to Self  Current suicidal ideation? Denies  Description of Suicide Plan nonr  Self-Injurious Behavior No self-injurious ideation or behavior indicators observed or expressed   Agreement Not to Harm Self Yes  Description of Agreement verbal  Danger to Others  Danger to Others None reported or observed

## 2024-05-09 NOTE — Plan of Care (Signed)
  Problem: Education: Goal: Emotional status will improve Outcome: Progressing Goal: Verbalization of understanding the information provided will improve Outcome: Progressing   Problem: Activity: Goal: Sleeping patterns will improve Outcome: Progressing   Problem: Coping: Goal: Ability to demonstrate self-control will improve Outcome: Progressing
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
## 2024-05-09 NOTE — Group Note (Unsigned)
 Therapy Group Note  Group Topic:Other  Group Date: 05/09/2024 Start Time: 1430 End Time: 1518 Facilitators: Filip Luten G, OT    Group OT session titled "Media Detox Challenge" was conducted with approximately 20 adolescent patients in the inpatient behavioral health unit. The group focused on exploring the impact of media/screen use on emotional regulation, occupational balance, and routine development. Patients were provided with structured handouts and guided through a step-by-step process that included individual reflection, small group planning, and large group discussion. Tasks included identifying current screen time habits, emotional effects of media use, and collaboratively designing a 24-hour "media detox" plan with non-screen-based alternatives.      Participation Level: {OT BHH Participation RUEAV:40981}   Participation Quality: {OT BHH Participation Quality:26268}   Behavior: {BHH OT Group Behavior:26269}   Speech/Thought Process: {BHH OT Speech/Thought Process:26270}   Affect/Mood: {OT BHH Affect/Mood:26271}   Insight: {OT BHH Insight:26272}   Judgement: {OT BHH Judgement:26272}   Individualization: *** was *** in their participation of group discussion/activity. *** identified  Modes of Intervention: {BHH MODES OF INTERVENTION:26273}  Patient Response to Interventions:  {BHH OT Patient Response to Interventions:26274}   Plan: Continue to engage patient in OT groups 2 - 3x/week.  05/09/2024  Lynnda Sas, OT

## 2024-05-10 DIAGNOSIS — F332 Major depressive disorder, recurrent severe without psychotic features: Secondary | ICD-10-CM | POA: Diagnosis not present

## 2024-05-10 NOTE — BHH Group Notes (Signed)
 Psychoeducational Group Note  Date:  05/10/2024 Time:  2015  Group Topic/Focus:  Wrap up group  Participation Level: Did Not Attend  Participation Quality:  Not Applicable  Affect:  Not Applicable  Cognitive:  Not Applicable  Insight:  Not Applicable  Engagement in Group: Not Applicable  Additional Comments:  Did not attend.  Metta Actis S 05/10/2024, 9:07 PM

## 2024-05-10 NOTE — Progress Notes (Signed)
   05/09/24 2145  Psych Admission Type (Psych Patients Only)  Admission Status Voluntary  Psychosocial Assessment  Patient Complaints Anxiety  Eye Contact Fair  Facial Expression Flat  Affect Anxious  Speech Logical/coherent  Interaction Poor  Motor Activity Slow  Appearance/Hygiene Poor hygiene;Body odor  Behavior Characteristics Appropriate to situation  Mood Anxious  Thought Process  Coherency WDL  Content WDL  Delusions None reported or observed  Perception WDL  Hallucination None reported or observed  Judgment Impaired  Confusion None  Danger to Self  Current suicidal ideation? Denies (Denies)  Agreement Not to Harm Self Yes  Description of Agreement Notify Staff  Danger to Others  Danger to Others None reported or observed

## 2024-05-10 NOTE — Group Note (Signed)
 LCSW Group Therapy Note   Group Date: 05/10/2024 Start Time: 1100 End Time: 1200   Participation:  patient was present and actively participated in the conversation    Type of Therapy:  Group Therapy  Topic:  Finding Balance: Using Wise Mind for Thoughtful Decisions  Objective:  To help participants understand and apply the concept of Agustina Aldrich Mind to make balanced, thoughtful decisions by integrating emotion and logic.  Goals: Learn the differences between Emotional Mind, Reasonable Mind, and [employer]. Recognize personal signs of Emotional and Reasonable Mind. Practice using [employer] in real-life scenarios.  Therapeutic Modalities: Elements of Dialectical Behavior Therapy (DBT):  Mindfulness (noticing thoughts and emotions without judgment), Emotion Regulation (understanding and managing emotional responses), Distress Tolerance (coping with difficult situations without making them worse), Wise Mind (integrating emotion and reason for balanced decision-making) Elements of Cognitive Behavioral Therapy (CBT):  Identifying automatic thoughts, Challenging cognitive distortions, Using logic to reframe unhelpful thinking patterns  Summary:  This class focused on Wise Mind - DBT's concept of balancing Emotional Mind and Reasonable Mind. We identified when we're in each state and practiced using Wise Mind to respond thoughtfully in real-life situations. By combining emotion and logic, participants can improve decision-making, manage challenges, and enhance relationships.   Tamara Guzman Kaylan Yates, LCSWA 05/10/2024  4:42 PM

## 2024-05-10 NOTE — Group Note (Signed)
 Date:  05/10/2024 Time:  9:19 AM  Group Topic/Focus:  Goals Group:   The focus of this group is to help patients establish daily goals to achieve during treatment and discuss how the patient can incorporate goal setting into their daily lives to aide in recovery.    Participation Level:  Active  Participation Quality:  Appropriate  Affect:  Appropriate   Ellan Gunner 05/10/2024, 9:19 AM

## 2024-05-10 NOTE — Plan of Care (Signed)
 Nurse discussed anxiety, depression and coping skills with patient.
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
## 2024-05-10 NOTE — Progress Notes (Signed)
 D:  Patient denied SI and HI,a contracts for safety.  Denied A/V hallucinations. A:  Medications administered per MD orders.  Emotional support and encouragement given patient. R:  Safety maintained with 15 minute checks.

## 2024-05-11 ENCOUNTER — Other Ambulatory Visit (HOSPITAL_COMMUNITY): Payer: Self-pay

## 2024-05-11 DIAGNOSIS — F332 Major depressive disorder, recurrent severe without psychotic features: Secondary | ICD-10-CM

## 2024-05-11 MED ORDER — HYDROXYZINE HCL 25 MG PO TABS
25.0000 mg | ORAL_TABLET | Freq: Three times a day (TID) | ORAL | 0 refills | Status: DC | PRN
  Filled 2024-05-11: qty 75, 25d supply, fill #0

## 2024-05-11 MED ORDER — VITAMIN D (ERGOCALCIFEROL) 1.25 MG (50000 UNIT) PO CAPS: ORAL_CAPSULE | ORAL | 0 refills | Status: DC

## 2024-05-11 MED ORDER — TRAZODONE HCL 50 MG PO TABS: 50.0000 mg | ORAL_TABLET | Freq: Every evening | ORAL | 0 refills | Status: DC | PRN

## 2024-05-11 MED ORDER — ARIPIPRAZOLE 15 MG PO TABS: 15.0000 mg | ORAL_TABLET | Freq: Every day | ORAL | 0 refills | Status: DC

## 2024-05-11 NOTE — BHH Suicide Risk Assessment (Signed)
 Suicide Risk Assessment  Discharge Assessment    [facility] Discharge Suicide Risk Assessment   Principal Problem: Bipolar I disorder, most recent episode (or current) manic, moderate with mixed features (HCC)  Discharge Diagnoses: Principal Problem:   Bipolar I disorder, most recent episode (or current) manic, moderate with mixed features (HCC) Active Problems:   Post traumatic stress disorder (PTSD)   ADHD (attention deficit hyperactivity disorder)   MDD (major depressive disorder), recurrent episode, severe (HCC)   Total Time spent with patient: Greater than 30 minutes.  Musculoskeletal: Strength & Muscle Tone: within normal limits Gait & Station: normal Patient leans: N/A  Psychiatric Specialty Exam  Presentation  General Appearance:  Appropriate for Environment; Casual  Eye Contact: Good  Speech: Clear and Coherent; Normal Rate  Speech Volume: Normal  Handedness: Right  Mood and Affect  Mood: Euthymic  Duration of Depression Symptoms: Greater than two weeks  Affect: Appropriate; Congruent  Thought Process  Thought Processes: Coherent; Goal Directed; Linear  Descriptions of Associations:Intact  Orientation:Full (Time, Place and Person)  Thought Content:Logical  History of Schizophrenia/Schizoaffective disorder:No  Duration of Psychotic Symptoms:N/A  Hallucinations:Hallucinations: None  Ideas of Reference:None  Suicidal Thoughts:Suicidal Thoughts: No  Homicidal Thoughts:Homicidal Thoughts: No   Sensorium  Memory: Immediate Good; Recent Good; Remote Good  Judgment: Fair  Insight: Fair  Art therapist  Concentration: Good  Attention Span: Good  Recall: Good  Fund of Knowledge: Fair  Language: Good   Psychomotor Activity  Psychomotor Activity:Psychomotor Activity: Normal   Assets  Assets: Communication Skills; Desire for Improvement; Housing; Physical Health; Resilience; Social Support   Sleep  Sleep:Sleep:  Good Number of Hours of Sleep: 8.5  Physical Exam/Ros: See discharge summary.  Blood pressure 113/75, pulse 95, temperature 98.7 F (37.1 C), temperature source Oral, resp. rate 14, height 5\' 1"  (1.549 m), weight 65.3 kg, SpO2 100%. Body mass index is 27.21 kg/m.  Mental Status Per Nursing Assessment::   On Admission:  Self-harm thoughts  Demographic Factors:  Adolescent or young adult and Low socioeconomic status  Loss Factors: Legal issues and Financial problems/change in socioeconomic status  Historical Factors: Impulsivity  Risk Reduction Factors:   Sense of responsibility to family, Living with another person, especially a relative, Positive social support, Positive therapeutic relationship, and Positive coping skills or problem solving skills  Continued Clinical Symptoms:  Bipolar Disorder:   Mixed State Alcohol/Substance Abuse/Dependencies More than one psychiatric diagnosis Unstable or Poor Therapeutic Relationship Previous Psychiatric Diagnoses and Treatments  Cognitive Features That Contribute To Risk:  Polarized thinking    Suicide Risk:  Minimal: No identifiable suicidal ideation.  Patients presenting with no risk factors but with morbid ruminations; may be classified as minimal risk based on the severity of the depressive symptoms   Follow-up Information     [facility]. Go on 05/15/2024.   Specialty: Behavioral Health Why: Please go to this provider on 05/15/24 at 7:00 am for an assessment, to obtain medication management services.  You may also go Monday through Friday, arrive by 7:00 am.  You will need to provide a phone number in order to receive an appointment. Contact information: [address] [place]  [phone]        [facility]. Go on 05/16/2024.   Specialty: Professional Counselor Why: Please go to this provider on 05/16/24 at 9:00 am for an assessment, to obtain therapy  services. You may also go Monday through Friday, from 9 am to 1 pm. Contact information:  [address] [place]  [address] [place] [address] [phone]                Plan Of Care/Follow-up recommendations:  Other:  See the discharge recommendations above.  Asuncion Layer, NP, pmhnp, fnp-bc. 05/11/2024, 10:26 AM

## 2024-05-11 NOTE — Transportation (Signed)
 05/11/2024  Tamara Guzman DOB: September 29, 2001 MRN: [id number]   RIDER WAIVER AND RELEASE OF LIABILITY  For the purposes of helping with transportation needs, [facility] partners with outside transportation providers (taxi companies, [employer], Catering manager.) to give [facility] patients or other approved people the choice of on-demand rides [employer]") to our buildings for non-emergency visits.  By using [employer], I, the person signing this document, on behalf of myself and/or any legal minors (in my care using the [employer]), agree:  Science writer given to me are supplied by independent, outside transportation providers who do not work for, or have any affiliation with, [employer]. [place] is not a transportation company. [facility] has no control over the quality or safety of the rides I get using [employer]. [facility] has no control over whether any outside ride will happen on time or not. [facility] gives no guarantee on the reliability, quality, safety, or availability on any rides, or that no mistakes will happen. I know and accept that traveling by vehicle (car, truck, [employer], [employer], bus, taxi, etc.) has risks of serious injuries such as disability, being paralyzed, and death. I know and agree the risk of using [employer] is mine alone, and not [employer]. Transport Services are provided "as is" and as are available. The transportation providers are in charge for all inspections and care of the vehicles used to provide these rides. I agree not to take legal action against [employer], its agents, employees, officers, directors, representatives, insurers, attorneys, assigns, successors, subsidiaries, and affiliates at any time for any reasons related directly or indirectly to using [employer]. I also agree not to take legal action against [facility] or its affiliates for any injury, death, or damage to property caused by or related to  using [employer]. I have read this Waiver and Release of Liability, and I understand the terms used in it and their legal meaning. This Waiver is freely and voluntarily given with the understanding that my right (or any legal minors) to legal action against [facility] relating to [employer] is knowingly given up to use these services.   I attest that I read the Ride Waiver and Release of Liability to Tamara Guzman, gave Ms. Peri Brackett the opportunity to ask questions and answered the questions asked (if any). I affirm that Tamara Guzman then provided consent for assistance with transportation.

## 2024-05-11 NOTE — Group Note (Signed)
 Recreation Therapy Group Note   Group Topic:Team Building  Group Date: 05/11/2024 Start Time: 0940 End Time: 1005 Facilitators: Adamari Frede-McCall, LRT,CTRS Location: [address] Dayroom   Group Topic: Communication, Team Building, Problem Solving  Goal Area(s) Addresses:  Patient will effectively work with peer towards shared goal.  Patient will identify skills used to make activity successful.  Patient will identify how skills used during activity can be used to reach post d/c goals.   Behavioral Response: Engaged  Intervention: STEM Activity  Activity: Straw Bridge. In teams of 3-5, patients were given 15 plastic drinking straws and an equal length of masking tape. Using the materials provided, patients were instructed to build a free standing bridge-like structure to suspend an everyday item (ex: puzzle box) off of the floor or table surface. All materials were required to be used by the team in their design. LRT facilitated post-activity discussion reviewing team process. Patients were encouraged to reflect how the skills used in this activity can be generalized to daily life post discharge.   Education: Pharmacist, community, Scientist, physiological, Discharge Planning   Education Outcome: Acknowledges education/In group clarification offered/Needs additional education.    Affect/Mood: Appropriate   Participation Level: Engaged   Participation Quality: Independent   Behavior: Appropriate   Speech/Thought Process: Focused   Insight: Good   Judgement: Good   Modes of Intervention: STEM Activity   Patient Response to Interventions:  Engaged   Education Outcome:  In group clarification offered    Clinical Observations/Individualized Feedback: Pt attended and participated in group.    Plan: Continue to engage patient in RT group sessions 2-3x/week.   Hilary Milks-McCall, LRT,CTRS  05/11/2024 11:45 AM

## 2024-05-11 NOTE — Progress Notes (Signed)
  [facility] Adult Case Management Discharge Plan :  Will you be returning to the same living situation after discharge:  Yes,  pt will be returning home after discharge. At discharge, do you have transportation home?: Yes,  pt will be discharging home via taxi voucher at 1pm. Do you have the ability to pay for your medications: Yes,  pt is insured - [employer]  Release of information consent forms completed and in the chart;  Patient's signature needed at discharge.  Patient to Follow up at:  Follow-up Information     [facility]. Go on 05/15/2024.   Specialty: Behavioral Health Why: Please go to this provider on 05/15/24 at 7:00 am for an assessment, to obtain medication management services.  You may also go Monday through Friday, arrive by 7:00 am.  You will need to provide a phone number in order to receive an appointment. Contact information: [phone] [place]  [phone]        [facility] Go on 05/16/2024.   Specialty: Professional Counselor Why: Please go to this provider on 05/16/24 at 9:00 am for an assessment, to obtain therapy services. You may also go Monday through Friday, from 9 am to 1 pm. Contact information: [address] [place]  [address] [place] [address] [phone]                 Next level of care provider has access to [facility] Link:no  Safety Planning and Suicide Prevention discussed: Yes,  safety planning discussed with patient, we have been unable to get in contact with her boyfriend. Patient denied access to firearms/guns and is able to contract for safety.     Has patient been referred to the Quitline?: Patient does not use tobacco/nicotine  products  Patient has been referred for addiction treatment: No known substance use disorder.  Shreyas Piatkowski M Dorrie Cocuzza, LCSWA 05/11/2024, 9:40 AM

## 2024-05-11 NOTE — Progress Notes (Signed)
 Patient ID: Tamara Guzman, female   DOB: 2001-10-08, 23 y.o.   MRN: [id number] Discharge instructions, medications and follow up appointments reviewed, patient verbalized understanding. Belongings and prescriptions given to patient, pt discharged via taxi.

## 2024-05-11 NOTE — Plan of Care (Signed)
  Problem: Education: Goal: Knowledge of [place] [facility] Education information/materials will improve Outcome: Completed/Met Goal: Mental status will improve Outcome: Progressing Goal: Verbalization of understanding the information provided will improve Outcome: Adequate for Discharge   Problem: Activity: Goal: Interest or engagement in activities will improve Outcome: Progressing

## 2024-05-11 NOTE — Progress Notes (Signed)
   05/11/24 0830  Psych Admission Type (Psych Patients Only)  Admission Status Voluntary  Psychosocial Assessment  Patient Complaints Anxiety  Eye Contact Fair  Facial Expression Flat  Affect Anxious  Speech Logical/coherent  Interaction Poor  Motor Activity Slow  Appearance/Hygiene Poor hygiene  Behavior Characteristics Appropriate to situation  Mood Anxious  Thought Process  Coherency WDL  Content WDL  Delusions None reported or observed  Perception WDL  Hallucination None reported or observed  Judgment Impaired  Confusion None  Danger to Self  Current suicidal ideation? Denies  Agreement Not to Harm Self Yes  Description of Agreement verbal  Danger to Others  Danger to Others None reported or observed

## 2024-05-11 NOTE — Plan of Care (Signed)
  Problem: Education: Goal: Knowledge of [place] [facility] Education information/materials will improve Outcome: Progressing Goal: Emotional status will improve Outcome: Progressing Goal: Verbalization of understanding the information provided will improve Outcome: Progressing   Problem: Activity: Goal: Interest or engagement in activities will improve Outcome: Progressing   Problem: Coping: Goal: Ability to verbalize frustrations and anger appropriately will improve Outcome: Progressing   Problem: Health Behavior/Discharge Planning: Goal: Identification of resources available to assist in meeting health care needs will improve Outcome: Progressing   Problem: Physical Regulation: Goal: Ability to maintain clinical measurements within normal limits will improve Outcome: Progressing   Problem: Safety: Goal: Periods of time without injury will increase Outcome: Progressing

## 2024-05-11 NOTE — [facility] Suicide Risk Assessment (Signed)
/  [facility] INPATIENT:  Family/Significant Other Suicide Prevention Education  Suicide Prevention Education:  Contact Attempts: Remey Mix (boyfriend) [phone], (name of family member/significant other) has been identified by the patient as the family member/significant other with whom the patient will be residing, and identified as the person(s) who will aid the patient in the event of a mental health crisis.  With written consent from the patient, two attempts were made to provide suicide prevention education, prior to and/or following the patient's discharge.  We were unsuccessful in providing suicide prevention education.  A suicide education pamphlet was given to the patient to share with family/significant other.  Date and time of first attempt:05/10/24//4:00PM Date and time of second attempt:05/11/24/9:30AM  Clester Chlebowski M Marine Lezotte, LCSWA 05/11/2024, 9:45 AM

## 2024-05-11 NOTE — Group Note (Signed)
 Date:  05/11/2024 Time:  9:12 AM  Group Topic/Focus:  Goals Group:   The focus of this group is to help patients establish daily goals to achieve during treatment and discuss how the patient can incorporate goal setting into their daily lives to aide in recovery.    Participation Level:  Did Not Attend   Ellan Gunner 05/11/2024, 9:12 AM
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
## 2024-05-11 NOTE — Plan of Care (Signed)
  Problem: Education: Goal: Knowledge of [place] [facility] Education information/materials will improve Outcome: Completed/Met Goal: Emotional status will improve Outcome: Completed/Met Goal: Mental status will improve 05/11/2024 1027 by Media Spikes, RN Outcome: Completed/Met 05/11/2024 0854 by Media Spikes, RN Outcome: Progressing Goal: Verbalization of understanding the information provided will improve 05/11/2024 1027 by Media Spikes, RN Outcome: Completed/Met 05/11/2024 0854 by Media Spikes, RN Outcome: Adequate for Discharge   Problem: Activity: Goal: Interest or engagement in activities will improve 05/11/2024 1027 by Media Spikes, RN Outcome: Completed/Met 05/11/2024 0854 by Media Spikes, RN Outcome: Progressing Goal: Sleeping patterns will improve Outcome: Completed/Met

## 2024-05-22 ENCOUNTER — Other Ambulatory Visit (HOSPITAL_COMMUNITY): Payer: Self-pay

## 2024-05-22 ENCOUNTER — Ambulatory Visit (HOSPITAL_COMMUNITY): Payer: MEDICAID | Admitting: Psychiatry

## 2024-09-04 ENCOUNTER — Emergency Department (HOSPITAL_COMMUNITY)
Admission: EM | Admit: 2024-09-04 | Discharge: 2024-09-04 | Disposition: A | Discharge status: Other Acute Inpatient Hospital | Payer: MEDICAID | Attending: Emergency Medicine | Admitting: Emergency Medicine

## 2024-09-04 ENCOUNTER — Emergency Department (HOSPITAL_COMMUNITY): Payer: MEDICAID

## 2024-09-04 ENCOUNTER — Other Ambulatory Visit: Payer: Self-pay

## 2024-09-04 ENCOUNTER — Encounter (HOSPITAL_COMMUNITY): Payer: Self-pay | Admitting: Psychiatry

## 2024-09-04 ENCOUNTER — Encounter (HOSPITAL_COMMUNITY): Payer: Self-pay

## 2024-09-04 ENCOUNTER — Inpatient Hospital Stay (HOSPITAL_COMMUNITY)
Admission: AD | Admit: 2024-09-04 | Discharge: 2024-09-08 | DRG: 885 | Disposition: A | Discharge status: Home / Self Care | Payer: MEDICAID | Source: Intra-hospital

## 2024-09-04 DIAGNOSIS — Z8249 Family history of ischemic heart disease and other diseases of the circulatory system: Secondary | ICD-10-CM | POA: Diagnosis not present

## 2024-09-04 DIAGNOSIS — S100XXA Contusion of throat, initial encounter: Secondary | ICD-10-CM | POA: Diagnosis not present

## 2024-09-04 DIAGNOSIS — Z91010 Allergy to peanuts: Secondary | ICD-10-CM | POA: Insufficient documentation

## 2024-09-04 DIAGNOSIS — Z9152 Personal history of nonsuicidal self-harm: Secondary | ICD-10-CM

## 2024-09-04 DIAGNOSIS — G238 Other specified degenerative diseases of basal ganglia: Secondary | ICD-10-CM

## 2024-09-04 DIAGNOSIS — R519 Headache, unspecified: Secondary | ICD-10-CM | POA: Diagnosis not present

## 2024-09-04 DIAGNOSIS — E669 Obesity, unspecified: Secondary | ICD-10-CM | POA: Diagnosis present

## 2024-09-04 DIAGNOSIS — Z889 Allergy status to unspecified drugs, medicaments and biological substances status: Secondary | ICD-10-CM | POA: Diagnosis not present

## 2024-09-04 DIAGNOSIS — F1910 Other psychoactive substance abuse, uncomplicated: Secondary | ICD-10-CM | POA: Diagnosis present

## 2024-09-04 DIAGNOSIS — S1980XA Other specified injuries of unspecified part of neck, initial encounter: Secondary | ICD-10-CM | POA: Diagnosis not present

## 2024-09-04 DIAGNOSIS — F322 Major depressive disorder, single episode, severe without psychotic features: Secondary | ICD-10-CM | POA: Diagnosis not present

## 2024-09-04 DIAGNOSIS — S40811A Abrasion of right upper arm, initial encounter: Secondary | ICD-10-CM | POA: Insufficient documentation

## 2024-09-04 DIAGNOSIS — Z91148 Patient's other noncompliance with medication regimen for other reason: Secondary | ICD-10-CM | POA: Diagnosis not present

## 2024-09-04 DIAGNOSIS — Z91410 Personal history of adult physical and sexual abuse: Secondary | ICD-10-CM | POA: Diagnosis not present

## 2024-09-04 DIAGNOSIS — F1990 Other psychoactive substance use, unspecified, uncomplicated: Secondary | ICD-10-CM | POA: Diagnosis present

## 2024-09-04 DIAGNOSIS — R45851 Suicidal ideations: Secondary | ICD-10-CM | POA: Diagnosis present

## 2024-09-04 DIAGNOSIS — Z833 Family history of diabetes mellitus: Secondary | ICD-10-CM

## 2024-09-04 DIAGNOSIS — F329 Major depressive disorder, single episode, unspecified: Secondary | ICD-10-CM | POA: Insufficient documentation

## 2024-09-04 DIAGNOSIS — Z91199 Patient's noncompliance with other medical treatment and regimen due to unspecified reason: Secondary | ICD-10-CM | POA: Diagnosis not present

## 2024-09-04 DIAGNOSIS — F3162 Bipolar disorder, current episode mixed, moderate: Secondary | ICD-10-CM | POA: Diagnosis present

## 2024-09-04 DIAGNOSIS — M25512 Pain in left shoulder: Secondary | ICD-10-CM | POA: Insufficient documentation

## 2024-09-04 DIAGNOSIS — Z888 Allergy status to other drugs, medicaments and biological substances status: Secondary | ICD-10-CM | POA: Diagnosis not present

## 2024-09-04 DIAGNOSIS — T43216A Underdosing of selective serotonin and norepinephrine reuptake inhibitors, initial encounter: Secondary | ICD-10-CM | POA: Diagnosis present

## 2024-09-04 DIAGNOSIS — Z91018 Allergy to other foods: Secondary | ICD-10-CM | POA: Diagnosis not present

## 2024-09-04 DIAGNOSIS — F3112 Bipolar disorder, current episode manic without psychotic features, moderate: Secondary | ICD-10-CM | POA: Diagnosis not present

## 2024-09-04 DIAGNOSIS — S0990XA Unspecified injury of head, initial encounter: Secondary | ICD-10-CM | POA: Diagnosis not present

## 2024-09-04 DIAGNOSIS — F4310 Post-traumatic stress disorder, unspecified: Secondary | ICD-10-CM | POA: Diagnosis present

## 2024-09-04 DIAGNOSIS — M25521 Pain in right elbow: Secondary | ICD-10-CM | POA: Diagnosis not present

## 2024-09-04 DIAGNOSIS — T43596A Underdosing of other antipsychotics and neuroleptics, initial encounter: Secondary | ICD-10-CM | POA: Diagnosis present

## 2024-09-04 DIAGNOSIS — F332 Major depressive disorder, recurrent severe without psychotic features: Principal | ICD-10-CM | POA: Diagnosis present

## 2024-09-04 DIAGNOSIS — S40812A Abrasion of left upper arm, initial encounter: Secondary | ICD-10-CM | POA: Insufficient documentation

## 2024-09-04 DIAGNOSIS — S199XXA Unspecified injury of neck, initial encounter: Secondary | ICD-10-CM | POA: Diagnosis present

## 2024-09-04 LAB — CBC WITH DIFFERENTIAL/PLATELET
Abs Immature Granulocytes: 0.02 K/uL (ref 0.00–0.07)
Basophils Absolute: 0 K/uL (ref 0.0–0.1)
Basophils Relative: 1 %
Eosinophils Absolute: 0.1 K/uL (ref 0.0–0.5)
Eosinophils Relative: 2 %
HCT: 38.5 % (ref 36.0–46.0)
Hemoglobin: 12 g/dL (ref 12.0–15.0)
Immature Granulocytes: 0 %
Lymphocytes Relative: 19 %
Lymphs Abs: 1 K/uL (ref 0.7–4.0)
MCH: 25.8 pg — ABNORMAL LOW (ref 26.0–34.0)
MCHC: 31.2 g/dL (ref 30.0–36.0)
MCV: 82.6 fL (ref 80.0–100.0)
Monocytes Absolute: 0.5 K/uL (ref 0.1–1.0)
Monocytes Relative: 9 %
Neutro Abs: 3.6 K/uL (ref 1.7–7.7)
Neutrophils Relative %: 69 %
Platelets: 286 K/uL (ref 150–400)
RBC: 4.66 MIL/uL (ref 3.87–5.11)
RDW: 14.6 % (ref 11.5–15.5)
WBC: 5.3 K/uL (ref 4.0–10.5)
nRBC: 0 % (ref 0.0–0.2)

## 2024-09-04 LAB — COMPREHENSIVE METABOLIC PANEL WITH GFR
ALT: 8 U/L (ref 0–44)
AST: 28 U/L (ref 15–41)
Albumin: 4.4 g/dL (ref 3.5–5.0)
Alkaline Phosphatase: 58 U/L (ref 38–126)
Anion gap: 13 (ref 5–15)
BUN: 16 mg/dL (ref 6–20)
CO2: 23 mmol/L (ref 22–32)
Calcium: 9.1 mg/dL (ref 8.9–10.3)
Chloride: 103 mmol/L (ref 98–111)
Creatinine, Ser: 1.12 mg/dL — ABNORMAL HIGH (ref 0.44–1.00)
GFR, Estimated: >60 mL/min (ref >60)
Glucose, Bld: 80 mg/dL (ref 70–99)
Potassium: 3.7 mmol/L (ref 3.5–5.1)
Sodium: 139 mmol/L (ref 135–145)
Total Bilirubin: 0.5 mg/dL (ref 0.0–1.2)
Total Protein: 7.3 g/dL (ref 6.5–8.1)

## 2024-09-04 LAB — URINE DRUG SCREEN
Amphetamines: POSITIVE — AB
Barbiturates: NEGATIVE
Benzodiazepines: NEGATIVE
Cocaine: POSITIVE — AB
Fentanyl: NEGATIVE
Methadone Scn, Ur: NEGATIVE
Opiates: NEGATIVE
Tetrahydrocannabinol: POSITIVE — AB

## 2024-09-04 LAB — ACETAMINOPHEN LEVEL: Acetaminophen (Tylenol), Serum: <10 ug/mL — ABNORMAL LOW (ref 10–30)

## 2024-09-04 LAB — SALICYLATE LEVEL: Salicylate Lvl: <7 mg/dL — ABNORMAL LOW (ref 7.0–30.0)

## 2024-09-04 LAB — ETHANOL: Alcohol, Ethyl (B): <15 mg/dL (ref <15)

## 2024-09-04 LAB — HCG, SERUM, QUALITATIVE: Preg, Serum: NEGATIVE

## 2024-09-04 MED ORDER — HALOPERIDOL LACTATE 5 MG/ML IJ SOLN: 5.0000 mg | Freq: Three times a day (TID) | INTRAMUSCULAR | Status: DC | PRN

## 2024-09-04 MED ORDER — HYDROXYZINE HCL 25 MG PO TABS: 25.0000 mg | ORAL_TABLET | Freq: Three times a day (TID) | ORAL | Status: DC | PRN

## 2024-09-04 MED ORDER — LORAZEPAM 2 MG/ML IJ SOLN: 2.0000 mg | Freq: Three times a day (TID) | INTRAMUSCULAR | Status: DC | PRN

## 2024-09-04 MED ORDER — SODIUM CHLORIDE 0.9 % IV BOLUS
1000.0000 mL | Freq: Once | INTRAVENOUS | Status: AC
  Administered 2024-09-04: 1000 mL via INTRAVENOUS

## 2024-09-04 MED ORDER — ACETAMINOPHEN 325 MG PO TABS
650.0000 mg | ORAL_TABLET | Freq: Four times a day (QID) | ORAL | Status: DC | PRN
  Administered 2024-09-04: 650 mg via ORAL
  Filled 2024-09-04: qty 2

## 2024-09-04 MED ORDER — HALOPERIDOL LACTATE 5 MG/ML IJ SOLN: 10.0000 mg | Freq: Three times a day (TID) | INTRAMUSCULAR | Status: DC | PRN

## 2024-09-04 MED ORDER — HALOPERIDOL 5 MG PO TABS: 5.0000 mg | ORAL_TABLET | Freq: Three times a day (TID) | ORAL | Status: DC | PRN

## 2024-09-04 MED ORDER — DIPHENHYDRAMINE HCL 25 MG PO CAPS: 50.0000 mg | ORAL_CAPSULE | Freq: Three times a day (TID) | ORAL | Status: DC | PRN

## 2024-09-04 MED ORDER — DIPHENHYDRAMINE HCL 50 MG/ML IJ SOLN: 50.0000 mg | Freq: Three times a day (TID) | INTRAMUSCULAR | Status: DC | PRN

## 2024-09-04 MED ORDER — MAGNESIUM HYDROXIDE 400 MG/5ML PO SUSP: 30.0000 mL | Freq: Every day | ORAL | Status: DC | PRN

## 2024-09-04 MED ORDER — IOHEXOL 350 MG/ML SOLN
75.0000 mL | Freq: Once | INTRAVENOUS | Status: AC | PRN
  Administered 2024-09-04: 75 mL via INTRAVENOUS

## 2024-09-04 MED ORDER — ALUM & MAG HYDROXIDE-SIMETH 200-200-20 MG/5ML PO SUSP: 30.0000 mL | ORAL | Status: DC | PRN

## 2024-09-04 MED ORDER — ACETAMINOPHEN 325 MG PO TABS
650.0000 mg | ORAL_TABLET | Freq: Once | ORAL | Status: AC
Start: 2024-09-04 — End: 2024-09-04
  Administered 2024-09-04: 650 mg via ORAL
  Filled 2024-09-04: qty 2
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
## 2024-09-04 NOTE — Progress Notes (Signed)
   09/04/24 2300  Psych Admission Type (Psych Patients Only)  Admission Status Voluntary  Psychosocial Assessment  Patient Complaints Anxiety;Substance abuse  Eye Contact Brief  Facial Expression Anxious  Affect Preoccupied;Anxious  Speech Logical/coherent  Interaction Cautious;Childlike;Guarded  Motor Activity Slow  Appearance/Hygiene Meticulous  Behavior Characteristics Anxious;Fidgety  Mood Pleasant;Anxious;Depressed  Thought Process  Coherency WDL  Content WDL  Delusions WDL  Perception WDL  Hallucination None reported or observed  Judgment Impaired  Confusion WDL  Danger to Self  Current suicidal ideation? Passive  Self-Injurious Behavior No self-injurious ideation or behavior indicators observed or expressed   Agreement Not to Harm Self Yes  Description of Agreement Verbal  Danger to Others  Danger to Others None reported or observed

## 2024-09-04 NOTE — [facility] Group Notes (Signed)
 [facility] Group Notes:  (Nursing/MHT/Case Management/Adjunct)  Date:  09/04/2024  Time:  2000  Type of Therapy:  Wrap up group  Participation Level:  Active  Participation Quality:  Appropriate, Attentive, Sharing, and Supportive  Affect:  Appropriate  Cognitive:  Alert  Insight:  Improving  Engagement in Group:  Engaged  Modes of Intervention:  Clarification, Education, and Support  Summary of Progress/Problems: Positive thinking and positive change were discussed.   Tamara Guzman 09/04/2024, 8:45 PM
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
## 2024-09-04 NOTE — ED Notes (Signed)
 [facility] called pts mother and friend who are listed in her contacts but both phone numbers are no longer in service.   Chesley Holt, [facility]  09/04/24

## 2024-09-04 NOTE — Progress Notes (Signed)
 Pt has been accepted to [facility] on 09/04/2024 Bed assignment: 300-01  Pt meets inpatient criteria per: Cathaleen Jacobson NP  Attending Physician will be: Dr. Prentis    Report can be called to unit: Adult unit: [phone]  Pt can arrive after ASAP  Care Team Notified: [facility] Cherylynn Ernst RN, I-Li Almeda RN, Cathaleen Mangrum NP   [place] Sharley Keeler LCSW-A   09/04/2024 1:39 PM
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
## 2024-09-04 NOTE — Group Note (Deleted)
 Date:  09/04/2024 Time:  4:27 PM  Group Topic/Focus:  Using Ask Me 3 to Understand Treatment     Participation Level:  {BHH PARTICIPATION OZCZO:77735}  Participation Quality:  {BHH PARTICIPATION QUALITY:22265}  Affect:  {BHH AFFECT:22266}  Cognitive:  {BHH COGNITIVE:22267}  Insight: {BHH Insight2:20797}  Engagement in Group:  {BHH ENGAGEMENT IN GROUP:22268}  Modes of Intervention:  {BHH MODES OF INTERVENTION:22269}  Additional Comments:  ***  Tamara Guzman 09/04/2024, 4:27 PM

## 2024-09-04 NOTE — ED Notes (Signed)
Pt was given a sandwich and drink. 
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
## 2024-09-04 NOTE — Plan of Care (Signed)
   Problem: Education: Goal: Emotional status will improve Outcome: Not Progressing Goal: Mental status will improve Outcome: Not Progressing Goal: Verbalization of understanding the information provided will improve Outcome: Not Progressing   Problem: Activity: Goal: Interest or engagement in activities will improve Outcome: Not Progressing
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
## 2024-09-05 ENCOUNTER — Encounter (HOSPITAL_COMMUNITY): Payer: Self-pay

## 2024-09-05 MED ORDER — HYDROXYZINE HCL 25 MG PO TABS
25.0000 mg | ORAL_TABLET | Freq: Three times a day (TID) | ORAL | Status: DC | PRN
  Filled 2024-09-05: qty 1

## 2024-09-05 MED ORDER — ARIPIPRAZOLE 15 MG PO TABS
15.0000 mg | ORAL_TABLET | Freq: Every day | ORAL | Status: DC
  Administered 2024-09-05 – 2024-09-08 (×4): 15 mg via ORAL
  Filled 2024-09-05: qty 7
  Filled 2024-09-05 (×4): qty 1

## 2024-09-05 MED ORDER — TRAZODONE HCL 50 MG PO TABS
50.0000 mg | ORAL_TABLET | Freq: Every day | ORAL | Status: DC
  Filled 2024-09-05: qty 1

## 2024-09-05 NOTE — BH IP Treatment Plan (Signed)
 Interdisciplinary Treatment and Diagnostic Plan Update  09/05/2024 Time of Session: 1026AM Tamara Guzman MRN: [id number]  Principal Diagnosis: MDD (major depressive disorder), recurrent severe, without psychosis (HCC)  Secondary Diagnoses: Principal Problem:   MDD (major depressive disorder), recurrent severe, without psychosis (HCC)   Current Medications:  Current Facility-Administered Medications  Medication Dose Route Frequency Provider Last Rate Last Admin   acetaminophen  (TYLENOL ) tablet 650 mg  650 mg Oral Q6H PRN Motley-Mangrum, Jadeka A, PMHNP   650 mg at 09/04/24 2004   alum & mag hydroxide-simeth (MAALOX/MYLANTA) 200-200-20 MG/5ML suspension 30 mL  30 mL Oral Q4H PRN Motley-Mangrum, Jadeka A, PMHNP       ARIPiprazole  (ABILIFY ) tablet 15 mg  15 mg Oral Daily Nwoko, Agnes I, NP   15 mg at 09/05/24 1344   haloperidol  (HALDOL ) tablet 5 mg  5 mg Oral TID PRN Motley-Mangrum, Jadeka A, PMHNP       And   diphenhydrAMINE  (BENADRYL ) capsule 50 mg  50 mg Oral TID PRN Motley-Mangrum, Jadeka A, PMHNP       haloperidol  lactate (HALDOL ) injection 5 mg  5 mg Intramuscular TID PRN Motley-Mangrum, Jadeka A, PMHNP       And   diphenhydrAMINE  (BENADRYL ) injection 50 mg  50 mg Intramuscular TID PRN Motley-Mangrum, Jadeka A, PMHNP       And   LORazepam  (ATIVAN ) injection 2 mg  2 mg Intramuscular TID PRN Motley-Mangrum, Jadeka A, PMHNP       haloperidol  lactate (HALDOL ) injection 10 mg  10 mg Intramuscular TID PRN Motley-Mangrum, Jadeka A, PMHNP       And   diphenhydrAMINE  (BENADRYL ) injection 50 mg  50 mg Intramuscular TID PRN Motley-Mangrum, Jadeka A, PMHNP       And   LORazepam  (ATIVAN ) injection 2 mg  2 mg Intramuscular TID PRN Motley-Mangrum, Jadeka A, PMHNP       magnesium  hydroxide (MILK OF MAGNESIA) suspension 30 mL  30 mL Oral Daily PRN Motley-Mangrum, Jadeka A, PMHNP       PTA Medications: Medications Prior to Admission  Medication Sig Dispense Refill Last Dose/Taking    ARIPiprazole  (ABILIFY ) 15 MG tablet Take 1 tablet (15 mg total) by mouth daily. For mood control (Patient not taking: Reported on 09/04/2024) 30 tablet 0    hydrOXYzine  (ATARAX ) 25 MG tablet Take 1 tablet (25 mg total) by mouth 3 (three) times daily as needed for anxiety. (Patient not taking: Reported on 09/04/2024) 75 tablet 0    traZODone  (DESYREL ) 50 MG tablet Take 1 tablet (50 mg total) by mouth at bedtime as needed for sleep. (Patient not taking: Reported on 09/04/2024) 30 tablet 0    Vitamin D , Ergocalciferol , (DRISDOL ) 1.25 MG (50000 UNIT) CAPS capsule Take 1 tablet by mouth Q weekly for vit. D replacement. (Patient not taking: Reported on 09/04/2024) 5 capsule 0     Patient Stressors: Financial difficulties   Substance abuse   Traumatic event    Patient Strengths: Capable of independent living  Communication skills  Physical Health   Treatment Modalities: Medication Management, Group therapy, Case management,  1 to 1 session with clinician, Psychoeducation, Recreational therapy.   Physician Treatment Plan for Primary Diagnosis: MDD (major depressive disorder), recurrent severe, without psychosis (HCC) Long Term Goal(s): Improvement in symptoms so as ready for discharge   Short Term Goals: Ability to identify and develop effective coping behaviors will improve Ability to maintain clinical measurements within normal limits will improve Compliance with prescribed medications will improve Ability to identify triggers  associated with substance abuse/mental health issues will improve Ability to identify changes in lifestyle to reduce recurrence of condition will improve Ability to verbalize feelings will improve Ability to disclose and discuss suicidal ideas Ability to demonstrate self-control will improve  Medication Management: Evaluate patient's response, side effects, and tolerance of medication regimen.  Therapeutic Interventions: 1 to 1 sessions, Unit Group sessions and Medication  administration.  Evaluation of Outcomes: Not Progressing  Physician Treatment Plan for Secondary Diagnosis: Principal Problem:   MDD (major depressive disorder), recurrent severe, without psychosis (HCC)  Long Term Goal(s): Improvement in symptoms so as ready for discharge   Short Term Goals: Ability to identify and develop effective coping behaviors will improve Ability to maintain clinical measurements within normal limits will improve Compliance with prescribed medications will improve Ability to identify triggers associated with substance abuse/mental health issues will improve Ability to identify changes in lifestyle to reduce recurrence of condition will improve Ability to verbalize feelings will improve Ability to disclose and discuss suicidal ideas Ability to demonstrate self-control will improve     Medication Management: Evaluate patient's response, side effects, and tolerance of medication regimen.  Therapeutic Interventions: 1 to 1 sessions, Unit Group sessions and Medication administration.  Evaluation of Outcomes: Not Progressing   RN Treatment Plan for Primary Diagnosis: MDD (major depressive disorder), recurrent severe, without psychosis (HCC) Long Term Goal(s): Knowledge of disease and therapeutic regimen to maintain health will improve  Short Term Goals: Ability to remain free from injury will improve, Ability to verbalize frustration and anger appropriately will improve, Ability to demonstrate self-control, Ability to participate in decision making will improve, Ability to verbalize feelings will improve, Ability to disclose and discuss suicidal ideas, Ability to identify and develop effective coping behaviors will improve, and Compliance with prescribed medications will improve  Medication Management: RN will administer medications as ordered by provider, will assess and evaluate patient's response and provide education to patient for prescribed medication. RN will  report any adverse and/or side effects to prescribing provider.  Therapeutic Interventions: 1 on 1 counseling sessions, Psychoeducation, Medication administration, Evaluate responses to treatment, Monitor vital signs and CBGs as ordered, Perform/monitor CIWA, COWS, AIMS and Fall Risk screenings as ordered, Perform wound care treatments as ordered.  Evaluation of Outcomes: Not Progressing   LCSW Treatment Plan for Primary Diagnosis: MDD (major depressive disorder), recurrent severe, without psychosis (HCC) Long Term Goal(s): Safe transition to appropriate next level of care at discharge, Engage patient in therapeutic group addressing interpersonal concerns.  Short Term Goals: Engage patient in aftercare planning with referrals and resources, Increase social support, Increase ability to appropriately verbalize feelings, Increase emotional regulation, Facilitate acceptance of mental health diagnosis and concerns, Facilitate patient progression through stages of change regarding substance use diagnoses and concerns, Identify triggers associated with mental health/substance abuse issues, and Increase skills for wellness and recovery  Therapeutic Interventions: Assess for all discharge needs, 1 to 1 time with Social worker, Explore available resources and support systems, Assess for adequacy in community support network, Educate family and significant other(s) on suicide prevention, Complete Psychosocial Assessment, Interpersonal group therapy.  Evaluation of Outcomes: Not Progressing   Progress in Treatment: Attending groups: No. Participating in groups: No. Taking medication as prescribed: Yes. Toleration medication: Yes. Family/Significant other contact made: No, will contact:  consents pending Patient understands diagnosis: Yes. Discussing patient identified problems/goals with staff: Yes. Medical problems stabilized or resolved: Yes. Denies suicidal/homicidal ideation: Yes. Issues/concerns  per patient self-inventory: No.  New problem(s) identified: No, Describe:  none  New Short Term/Long Term Goal(s): medication stabilization, elimination of SI thoughts, development of comprehensive mental wellness plan and sobriety plan.    Patient Goals:  Get on medications for my anxiety  Discharge Plan or Barriers: Patient recently admitted. CSW will continue to follow and assess for appropriate referrals and possible discharge planning.    Reason for Continuation of Hospitalization: Anxiety Depression Medication stabilization  Estimated Length of Stay: 5-7 days  Last 3 [place] Suicide Severity Risk Score: Flowsheet Row [facility] Most recent reading at 09/04/2024  5:06 PM ED from 09/04/2024 in [facility] Most recent reading at 09/04/2024  6:38 AM Admission (Discharged) [facility] Most recent reading at 05/06/2024 11:40 PM  C-SSRS RISK CATEGORY Low Risk No Risk Moderate Risk    Last PHQ 2/9 Scores:    07/21/2022   10:47 AM 07/21/2022    5:34 AM 06/29/2022    3:08 PM  Depression screen PHQ 2/9  Decreased Interest 3 2 0  Down, Depressed, Hopeless 0 2 0  PHQ - 2 Score 3 4 0  Altered sleeping 0 1 0  Tired, decreased energy 2 1 0  Change in appetite 0 1 0  Feeling bad or failure about yourself  1 1 0  Trouble concentrating 0 1 1  Moving slowly or fidgety/restless 0 1 0  Suicidal thoughts  1 0  PHQ-9 Score 6 11 1   Difficult doing work/chores  Somewhat difficult Somewhat difficult    Scribe for Treatment Team: Tamara Guzman 09/05/2024 2:57 PM

## 2024-09-05 NOTE — Group Note (Signed)
 Recreation Therapy Group Note   Group Topic:Team Building  Group Date: 09/05/2024 Start Time: 0930 End Time: 1000 Facilitators: Dysen Edmondson-McCall, LRT,CTRS Location: 300 Hall Dayroom   Group Topic: Communication, Team Building, Problem Solving  Goal Area(s) Addresses:  Patient will effectively work with peer towards shared goal.  Patient will identify skills used to make activity successful.  Patient will share challenges and verbalize solution-driven approaches used. Patient will identify how skills used during activity can be used to reach post d/c goals.   Behavioral Response:   Intervention: STEM Activity   Activity: [employer]. Patients were provided the following materials: 5 drinking straws, 5 rubber bands, 5 paper clips, 2 index cards, 2 drinking cups, and 2 toilet paper rolls. Using the provided materials patients were asked to build a launching mechanism to launch a ping pong ball across the room, approximately 10 feet. Patients were divided into teams of 3-5. Instructions required all materials be incorporated into the device, functionality of items left to the peer group's discretion.  Education: Pharmacist, community, Scientist, physiological, Air cabin crew, Building control surveyor.   Education Outcome: Acknowledges education/In group clarification offered/Needs additional education.    Affect/Mood: N/A   Participation Level: Did not attend    Clinical Observations/Individualized Feedback:     Plan: Continue to engage patient in RT group sessions 2-3x/week.   Letesha Klecker-McCall, LRT,CTRS  09/05/2024 12:00 PM

## 2024-09-05 NOTE — Progress Notes (Signed)
   09/05/24 1500  Psych Admission Type (Psych Patients Only)  Admission Status Voluntary  Psychosocial Assessment  Patient Complaints Anxiety;Substance abuse  Eye Contact Brief  Facial Expression Anxious  Affect Anxious;Irritable  Speech Logical/coherent  Interaction Cautious;Guarded  Motor Activity Slow  Appearance/Hygiene Meticulous  Behavior Characteristics Anxious;Fidgety  Mood Pleasant;Depressed;Anxious  Thought Process  Coherency WDL  Content WDL  Delusions None reported or observed  Perception WDL  Hallucination None reported or observed  Judgment Impaired  Confusion WDL  Danger to Self  Current suicidal ideation? Passive  Description of Suicide Plan No Plan  Self-Injurious Behavior No self-injurious ideation or behavior indicators observed or expressed   Agreement Not to Harm Self Yes  Description of Agreement Verbal  Danger to Others  Danger to Others None reported or observed

## 2024-09-05 NOTE — Plan of Care (Signed)
   Problem: Education: Goal: Emotional status will improve Outcome: Not Progressing Goal: Mental status will improve Outcome: Not Progressing Goal: Verbalization of understanding the information provided will improve Outcome: Not Progressing   Problem: Activity: Goal: Interest or engagement in activities will improve Outcome: Not Progressing

## 2024-09-05 NOTE — [facility] Suicide Risk Assessment (Signed)
 Suicide Risk Assessment  Admission Assessment    [facility] Admission Suicide Risk Assessment   Nursing information obtained from:  Patient  Demographic factors:  Adolescent or young adult, Low socioeconomic status, Unemployed  Current Mental Status:  Suicidal ideation indicated by patient  Loss Factors:  NA  Historical Factors:  Impulsivity  Risk Reduction Factors:  NA  Total Time spent with patient: 1.5 hours  Principal Problem: MDD (major depressive disorder), recurrent severe, without psychosis (HCC)  Diagnosis:  Principal Problem:   MDD (major depressive disorder), recurrent severe, without psychosis (HCC)  Subjective Data: See H&P.  Continued Clinical Symptoms:  Alcohol Use Disorder Identification Test Final Score (AUDIT): 0 The Alcohol Use Disorders Identification Test, Guidelines for Use in Primary Care, Second Edition.  World Science writer [facility]). Score between 0-7:  no or low risk or alcohol related problems. Score between 8-15:  moderate risk of alcohol related problems. Score between 16-19:  high risk of alcohol related problems. Score 20 or above:  warrants further diagnostic evaluation for alcohol dependence and treatment.  CLINICAL FACTORS:   Bipolar Disorder:   Mixed State Obsessive-Compulsive Disorder Personality Disorders:   Cluster B More than one psychiatric diagnosis Unstable or Poor Therapeutic Relationship Previous Psychiatric Diagnoses and Treatments  Musculoskeletal: Strength & Muscle Tone: within normal limits Gait & Station: normal Patient leans: N/A  Psychiatric Specialty Exam:  Presentation  General Appearance:  Appropriate for Environment; Casual  Eye Contact: Good  Speech: Clear and Coherent; Normal Rate  Speech Volume: Normal  Handedness: Right  Mood and Affect  Mood: Euthymic  Affect: Appropriate; Congruent  Thought Process  Thought Processes: Coherent; Goal Directed; Linear  Descriptions of  Associations:Intact  Orientation:Full (Time, Place and Person)  Thought Content:Logical  History of Schizophrenia/Schizoaffective disorder:No  Duration of Psychotic Symptoms:N/A  Hallucinations:No data recorded Ideas of Reference:None  Suicidal Thoughts:No data recorded Homicidal Thoughts:No data recorded  Sensorium  Memory: Immediate Good; Recent Good; Remote Good  Judgment: Fair  Insight: Fair  Art therapist  Concentration: Good  Attention Span: Good  Recall: Good  Fund of Knowledge: Fair  Language: Good  Psychomotor Activity  Psychomotor Activity:No data recorded  Assets  Assets: Communication Skills; Desire for Improvement; Housing; Physical Health; Resilience; Social Support  Sleep  Sleep:No data recorded   Physical Exam: See H&P.  Blood pressure 113/60, pulse 96, temperature 98.7 F (37.1 C), temperature source Oral, resp. rate 20, height 5' 2 (1.575 m), weight 67.4 kg, SpO2 100%. Body mass index is 27.18 kg/m.  COGNITIVE FEATURES THAT CONTRIBUTE TO RISK:  Polarized thinking and Thought constriction (tunnel vision)    SUICIDE RISK:   Severe:  Frequent, intense, and enduring suicidal ideation, specific plan, no subjective intent, but some objective markers of intent (i.e., choice of lethal method), the method is accessible, some limited preparatory behavior, evidence of impaired self-control, severe dysphoria/symptomatology, multiple risk factors present, and few if any protective factors, particularly a lack of social support.  PLAN OF CARE: See H&P.  I certify that inpatient services furnished can reasonably be expected to improve the patient's condition.   Mac Bolster, NP, pmhnp, fnp-bc. 09/05/2024, 9:40 AM

## 2024-09-05 NOTE — Progress Notes (Signed)
(  Sleep Hours) - 7.25  (Any PRNs that were needed, meds refused, or side effects to meds)- n/a  (Any disturbances and when (visitation, over night)- n/a  (Concerns raised by the patient)- n/a  (SI/HI/AVH)- denies

## 2024-09-05 NOTE — Plan of Care (Signed)
  Problem: Education: Goal: Emotional status will improve Outcome: Progressing Goal: Mental status will improve Outcome: Progressing   Problem: Activity: Goal: Sleeping patterns will improve Outcome: Progressing   Problem: Activity: Goal: Interest or engagement in activities will improve Outcome: Not Progressing
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
## 2024-09-05 NOTE — Group Note (Signed)
 Date:  09/05/2024 Time:  4:33 PM  Group Topic/Focus:  Dimensions of Wellness: The focus of this group is to introduce the topic of wellness and discuss the role each dimension of wellness plays in total health.    Participation Level:  Did Not Attend   Tamara Guzman 09/05/2024, 4:33 PM

## 2024-09-06 NOTE — Progress Notes (Signed)
(  Sleep Hours) - 9.25 (Any PRNs that were needed, meds refused, or side effects to meds)- No PRN meds given, trazodone  50 mg refused.  (Any disturbances and when (visitation, over night)- None  (Concerns raised by the patient)- None  (SI/HI/AVH)- Denies SI/HI/AVH

## 2024-09-06 NOTE — Group Note (Signed)
 LCSW Group Therapy Note   Group Date: 09/06/2024 Start Time: 1100 End Time: 1200   Participation:  did not attend  Type of Therapy:  Group Therapy  Topic:  Healing Flames: Navigating Anger with Compassion  Objective:  Foster self-awareness and promote compassion toward oneself and others when dealing with anger.  Goals:  Help participants understand the underlying emotions and needs fueling anger. Provide coping strategies for healthier emotional expression and anger management.  Summary: This session explored anger as a volcano - an explosion driven by deeper feelings and unmet needs. Participants learned to identify anger triggers and underlying emotions, then practiced coping strategies like deep breathing, physical activity, and journaling. The group discussed healthy ways to manage anger before it escalates, using both personal reflection and shared experiences.  Therapeutic Modalities: Cognitive Behavioral Therapy (CBT): Challenging thoughts that fuel anger. Mindfulness: Increasing awareness of emotions and sensations.   Maurico Perrell O Makalyn Lennox, LCSWA 09/06/2024  12:48 PM

## 2024-09-06 NOTE — BHH Counselor (Signed)
 Adult Comprehensive Assessment  Patient ID: Tamara Guzman, female   DOB: [birth year], 23 y.o.   MRN: [id number]  PSA Attempt #2, patient refused to continue assessment. Patient was irritable, agitated, and consistently raised her voice and responded with profanities. CSW was unable to complete assessment, will attempt again on 09/07/24.   Information Source: Information source: Patient  Current Stressors:  Patient states their primary concerns and needs for treatment are:: Suicidal thoughts Patient denies SI, HI, and AVH. Patient states their goals for this hospitilization and ongoing recovery are:: To give myself a chance and make sure my medicine is working before I Scientist, clinical (histocompatibility and immunogenetics) / Learning stressors: None reported Employment / Job issues: None reported Family Relationships: None reported Surveyor, quantity / Lack of resources (include bankruptcy): None reported Housing / Lack of housing: None reported Physical health (include injuries & life threatening diseases): None reported Social relationships: None reported Substance abuse: Crack cocaine, I don't know how often Bereavement / Loss: None reported  Living/Environment/Situation:  Living Arrangements: Spouse/significant other Living conditions (as described by patient or guardian): Good Who else lives in the home?: Patient's boyfriend How long has patient lived in current situation?: About a year What is atmosphere in current home: Comfortable  Family History:  Marital status: Long term relationship Long term relationship, how long?: 3 years What types of issues is patient dealing with in the relationship?: None reported Are you sexually active?: Yes What is your sexual orientation?: Bisexual Has your sexual activity been affected by drugs, alcohol, medication, or emotional stress?: None reported Does patient have children?: No  Childhood History:  By whom was/is the patient raised?: Grandparents Description of patient's  relationship with caregiver when they were a child: It was alright Patient's description of current relationship with people who raised him/her: I don't know I don't talk to her How were you disciplined when you got in trouble as a child/adolescent?: I got whoopings Does patient have siblings?: Yes Number of Siblings: 3 Description of patient's current relationship with siblings: I don't talk to them either Did patient suffer any verbal/emotional/physical/sexual abuse as a child?: No Did patient suffer from severe childhood neglect?: No Has patient ever been sexually abused/assaulted/raped as an adolescent or adult?: No Was the patient ever a victim of a crime or a disaster?: No Witnessed domestic violence?: No Has patient been affected by domestic violence as an adult?: No  Education:  Highest grade of school patient has completed: High school diploma Currently a student?: No Learning disability?: No     [name], LCSW-A. 09/06/2024

## 2024-09-06 NOTE — Group Note (Signed)
 Date:  09/06/2024 Time:  9:23 AM  Group Topic/Focus:  Goals Group:   The focus of this group is to help patients establish daily goals to achieve during treatment and discuss how the patient can incorporate goal setting into their daily lives to aide in recovery.    Participation Level:  Did Not Attend  Participation Quality:  na  Affect:  na  Cognitive:  na  Insight: None  Engagement in Group:  na  Modes of Intervention:  na  Additional Comments:  na  Nat Rummer 09/06/2024, 9:23 AM

## 2024-09-06 NOTE — Plan of Care (Signed)
   Problem: Education: Goal: Emotional status will improve Outcome: Progressing Goal: Mental status will improve Outcome: Progressing Goal: Verbalization of understanding the information provided will improve Outcome: Progressing

## 2024-09-06 NOTE — B[facility] Counselor (Signed)
 Adult Comprehensive Assessment  Patient ID: Tamara Guzman, female   DOB: 01-31-2001, 23 y.o.   MRN: [id number]  First attempt with patient to complete PSA. Patient stated I have a headache, can you give me 30 minutes  CSW will attempt again on 09/06/24    [name], LCSW-A  09/06/2024

## 2024-09-06 NOTE — Plan of Care (Signed)
   Problem: Education: Goal: Emotional status will improve Outcome: Progressing Goal: Mental status will improve Outcome: Progressing

## 2024-09-06 NOTE — [facility] Group Notes (Signed)
 Adult Psychoeducational Group Note  Date:  09/06/2024 Time:  8:56 PM  Group Topic/Focus:  Wrap-Up Group:   The focus of this group is to help patients review their daily goal of treatment and discuss progress on daily workbooks.  Participation Level:  Did Not Attend  Tamara Guzman 09/06/2024, 8:56 PM
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
## 2024-09-06 NOTE — Group Note (Signed)
 Recreation Therapy Group Note   Group Topic:Other  Group Date: 09/06/2024 Start Time: 1307 End Time: 1346 Facilitators: Daryl Quiros-McCall, LRT,CTRS Location: [address] Dayroom   Activity Description/Intervention: Therapeutic Drumming. Patients with peers and staff were given the opportunity to engage in a leader facilitated [facility] Empowerment Drumming Circle with staff from the [employer], in partnership with [employer]. Teaching laboratory technician and trained [employer], Norleen Mon leading with LRT observing and documenting intervention and pt response. This evidenced-based practice targets 7 areas of health and wellbeing in the human experience including: stress-reduction, exercise, self-expression, camaraderie/support, nurturing, spirituality, and music-making (leisure).   Goal Area(s) Addresses:  Patient will engage in pro-social way in music group.  Patient will follow directions of drum leader on the first prompt. Patient will demonstrate no behavioral issues during group.  Patient will identify if a reduction in stress level occurs as a result of participation in therapeutic drum circle.    Education: Leisure exposure, Pharmacologist, Musical expression, Discharge Planning   Affect/Mood: N/A   Participation Level: Did not attend    Clinical Observations/Individualized Feedback:      Plan: Continue to engage patient in RT group sessions 2-3x/week.   Moranda Billiot-McCall, LRT,CTRS 09/06/2024 2:16 PM

## 2024-09-06 NOTE — Progress Notes (Signed)
   09/06/24 1400  Psych Admission Type (Psych Patients Only)  Admission Status Voluntary  Psychosocial Assessment  Patient Complaints Irritability  Eye Contact Brief  Facial Expression Anxious  Affect Anxious;Irritable  Speech Logical/coherent  Interaction Hostile;Guarded;Isolative  Motor Activity Slow  Appearance/Hygiene Body odor;Disheveled;Poor hygiene  Behavior Characteristics Unwilling to participate;Irritable  Mood Irritable  Thought Process  Coherency WDL  Content WDL  Delusions None reported or observed  Perception WDL  Hallucination None reported or observed  Judgment Impaired  Confusion None  Danger to Self  Current suicidal ideation? Denies  Self-Injurious Behavior No self-injurious ideation or behavior indicators observed or expressed   Agreement Not to Harm Self Yes  Description of Agreement verbal  Danger to Others  Danger to Others None reported or observed

## 2024-09-06 NOTE — Plan of Care (Signed)

## 2024-09-07 NOTE — BHH Counselor (Signed)
 Adult Comprehensive Assessment  Patient ID: Tamara Guzman, female   DOB: 06/25/2001, 23 y.o.   MRN: [id number]  Attempt #3 to complete PSA with patient. CSW will complete chart review.   [name]. 09/07/2024

## 2024-09-07 NOTE — Progress Notes (Signed)
   09/07/24 1100  Psych Admission Type (Psych Patients Only)  Admission Status Voluntary  Psychosocial Assessment  Patient Complaints Irritability  Eye Contact Brief  Facial Expression Anxious  Affect Anxious;Irritable  Speech Logical/coherent  Interaction Guarded  Motor Activity Slow  Appearance/Hygiene Disheveled;Poor hygiene;Body odor  Behavior Characteristics Irritable  Mood Irritable  Thought Process  Coherency WDL  Content WDL  Delusions None reported or observed  Perception WDL  Hallucination None reported or observed  Judgment Impaired  Confusion None  Danger to Self  Current suicidal ideation? Denies  Self-Injurious Behavior No self-injurious ideation or behavior indicators observed or expressed   Agreement Not to Harm Self Yes  Description of Agreement verbal  Danger to Others  Danger to Others None reported or observed
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
## 2024-09-07 NOTE — Group Note (Signed)
 Date:  09/07/2024 Time:  9:04 AM  Group Topic/Focus:  Goals Group:   The focus of this group is to help patients establish daily goals to achieve during treatment and discuss how the patient can incorporate goal setting into their daily lives to aide in recovery. Patients were asked two questions: What is something you can do today to take care of your mental health? What is 1 thing you're proud of doing lately?    Participation Level:  Did Not Attend  Participation Quality:  N/A  Affect:  N/A  Cognitive:  N/A  Insight: None  Engagement in Group:  N/A  Modes of Intervention:  N/A  Additional Comments:  Patient did not attend goals group.  Kristi HERO Eugene Isadore 09/07/2024, 9:04 AM

## 2024-09-07 NOTE — Group Note (Deleted)
 Date:  09/07/2024 Time:  10:40 PM  Group Topic/Focus:  Emotional Education:   The focus of this group is to discuss what feelings/emotions are, and how they are experienced.     Participation Level:  {BHH PARTICIPATION OZCZO:77735}  Participation Quality:  {BHH PARTICIPATION QUALITY:22265}  Affect:  {BHH AFFECT:22266}  Cognitive:  {BHH COGNITIVE:22267}  Insight: {BHH Insight2:20797}  Engagement in Group:  {BHH ENGAGEMENT IN HMNLE:77731}  Modes of Intervention:  {BHH MODES OF INTERVENTION:22269}  Additional Comments:  ***  Tamara Guzman L 09/07/2024, 10:40 PM

## 2024-09-07 NOTE — Group Note (Signed)
 Recreation Therapy Group Note   Group Topic:Leisure Education  Group Date: 09/07/2024 Start Time: 0935 End Time: 1003 Facilitators: Brentt Fread-McCall, LRT,CTRS Location: [address] Dayroom   Group Topic: Leisure Education  Goal Area(s) Addresses:  Patient will identify positive leisure activities.  Patient will identify one positive benefit of participation in leisure activities.   Behavioral Response:   Intervention: Leisure Group Game  Activity: Patient, MHT, and LRT participated in playing a trivia game of Guess the [place]. Participants took turns flicking the spinner. Whatever category (R&B, Hip Hop, Pop, Indie, Dance and Rock) the spinner landed on, the person would be read the lyrics and the person would have guess the missing lyric. The person with the most cards at the end of game won.  Education:  Leisure Exposure, Pharmacist, community, Discharge Planning  Education Outcome: Acknowledges education/In group clarification offered/Needs additional education   Affect/Mood: N/A   Participation Level: Did not attend    Clinical Observations/Individualized Feedback:      Plan: Continue to engage patient in RT group sessions 2-3x/week.   Jahmal Dunavant-McCall, LRT,CTRS 09/07/2024 12:28 PM

## 2024-09-07 NOTE — Plan of Care (Signed)

## 2024-09-07 NOTE — Progress Notes (Signed)
(  Sleep Hours) - 8 (Any PRNs that were needed, meds refused, or side effects to meds)- No PRN meds given, trazodone  50 mg refused.  (Any disturbances and when (visitation, over night)- None  (Concerns raised by the patient)- None  (SI/HI/AVH)- Denies SI/HI/AVH

## 2024-09-07 NOTE — B[facility] Suicide Risk Assessment (Signed)
 B[facility] INPATIENT:  Family/Significant Other Suicide Prevention Education  Suicide Prevention Education:  Patient Refusal for Family/Significant Other Suicide Prevention Education: The patient Tamara Guzman has refused to provide written consent for family/significant other to be provided Family/Significant Other Suicide Prevention Education during admission and/or prior to discharge.  Physician notified.  Suicide Prevention Education was reviewed thoroughly with patient, including risk factors, warning signs, and what to do. Mobile Crisis services were described and that telephone number pointed out, with encouragement to patient to put this number in personal cell phone. Brochure was provided to patient to share with natural supports. Patient acknowledged the ways in which they are at risk, and how working through each of their issues can gradually start to reduce their risk factors. Patient was encouraged to think of the information in the context of people in their own lives. Patient denied having access to firearms Patient verbalized understanding of information provided. Patient endorsed a desire to live.    Tamara Guzman 09/07/2024, 11:42 AM
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
## 2024-09-08 ENCOUNTER — Other Ambulatory Visit (HOSPITAL_COMMUNITY): Payer: Self-pay

## 2024-09-08 DIAGNOSIS — F3112 Bipolar disorder, current episode manic without psychotic features, moderate: Secondary | ICD-10-CM

## 2024-09-08 MED ORDER — ARIPIPRAZOLE 15 MG PO TABS: 15.0000 mg | ORAL_TABLET | Freq: Every day | ORAL | 0 refills | Status: DC

## 2024-09-08 MED ORDER — ARIPIPRAZOLE 15 MG PO TABS
15.0000 mg | ORAL_TABLET | Freq: Every day | ORAL | 0 refills | Status: DC
  Filled 2024-09-08: qty 30, 30d supply, fill #0

## 2024-09-08 NOTE — Group Note (Signed)
 LCSW Group Therapy Note   Group Date: 09/08/2024 Start Time: 1300 End Time: 1400   Type of Therapy and Topic:  Group Therapy: Boundaries  Participation Level:  Did Not Attend  Description of Group: This group will address the use of boundaries in their personal lives. Patients will explore why boundaries are important, the difference between healthy and unhealthy boundaries, and negative and postive outcomes of different boundaries and will look at how boundaries can be crossed.  Patients will be encouraged to identify current boundaries in their own lives and identify what kind of boundary is being set. Facilitators will guide patients in utilizing problem-solving interventions to address and correct types boundaries being used and to address when no boundary is being used. Understanding and applying boundaries will be explored and addressed for obtaining and maintaining a balanced life. Patients will be encouraged to explore ways to assertively make their boundaries and needs known to significant others in their lives, using other group members and facilitator for role play, support, and feedback.  Therapeutic Goals:  1.  Patient will identify areas in their life where setting clear boundaries could be  used to improve their life.  2.  Patient will identify signs/triggers that a boundary is not being respected. 3.  Patient will identify two ways to set boundaries in order to achieve balance in  their lives: 4.  Patient will demonstrate ability to communicate their needs and set boundaries  through discussion and/or role plays  Summary of Patient Progress:  Pt was invited but did not attend  Therapeutic Modalities:   Cognitive Behavioral Therapy Solution-Focused Therapy  Golda Louder, LCSWA 09/08/2024  3:02 PM

## 2024-09-08 NOTE — Progress Notes (Signed)
 Upon discharging, Nurse went over AVS educating on medications and follow up appointments.Patient also received medication samples.  Suicide safety plan was completed. Reviewed with nurse and copy given to patient and placed in chart. Belongings sheet was signed and items returned to patient. Patient denies SI/HI (with no plan) and AVH. Voices no acute concerns to staff prior discharging off unit. Was safely walked out ride.
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
## 2024-09-08 NOTE — Plan of Care (Signed)
 Problem: Education: Goal: Knowledge of [place] [facility] Education information/materials will improve Outcome: Adequate for Discharge Goal: Emotional status will improve Outcome: Adequate for Discharge Goal: Mental status will improve Outcome: Adequate for Discharge Goal: Verbalization of understanding the information provided will improve Outcome: Adequate for Discharge   Problem: Activity: Goal: Interest or engagement in activities will improve Outcome: Adequate for Discharge Goal: Sleeping patterns will improve Outcome: Adequate for Discharge   Problem: Coping: Goal: Ability to verbalize frustrations and anger appropriately will improve Outcome: Adequate for Discharge Goal: Ability to demonstrate self-control will improve Outcome: Adequate for Discharge   Problem: Health Behavior/Discharge Planning: Goal: Identification of resources available to assist in meeting health care needs will improve Outcome: Adequate for Discharge Goal: Compliance with treatment plan for underlying cause of condition will improve Outcome: Adequate for Discharge   Problem: Physical Regulation: Goal: Ability to maintain clinical measurements within normal limits will improve Outcome: Adequate for Discharge   Problem: Safety: Goal: Periods of time without injury will increase Outcome: Adequate for Discharge   Problem: Education: Goal: Utilization of techniques to improve thought processes will improve Outcome: Adequate for Discharge Goal: Knowledge of the prescribed therapeutic regimen will improve Outcome: Adequate for Discharge   Problem: Activity: Goal: Interest or engagement in leisure activities will improve Outcome: Adequate for Discharge Goal: Imbalance in normal sleep/wake cycle will improve Outcome: Adequate for Discharge   Problem: Coping: Goal: Coping ability will improve Outcome: Adequate for Discharge Goal: Will verbalize feelings Outcome: Adequate for Discharge    Problem: Health Behavior/Discharge Planning: Goal: Ability to make decisions will improve Outcome: Adequate for Discharge Goal: Compliance with therapeutic regimen will improve Outcome: Adequate for Discharge   Problem: Role Relationship: Goal: Will demonstrate positive changes in social behaviors and relationships Outcome: Adequate for Discharge   Problem: Safety: Goal: Ability to disclose and discuss suicidal ideas will improve Outcome: Adequate for Discharge Goal: Ability to identify and utilize support systems that promote safety will improve Outcome: Adequate for Discharge   Problem: Self-Concept: Goal: Will verbalize positive feelings about self Outcome: Adequate for Discharge Goal: Level of anxiety will decrease Outcome: Adequate for Discharge   Problem: Education: Goal: Knowledge of [place] [facility] Education information/materials will improve Outcome: Adequate for Discharge Goal: Emotional status will improve Outcome: Adequate for Discharge Goal: Mental status will improve Outcome: Adequate for Discharge Goal: Verbalization of understanding the information provided will improve Outcome: Adequate for Discharge   Problem: Activity: Goal: Interest or engagement in activities will improve Outcome: Adequate for Discharge Goal: Sleeping patterns will improve Outcome: Adequate for Discharge   Problem: Coping: Goal: Ability to verbalize frustrations and anger appropriately will improve Outcome: Adequate for Discharge Goal: Ability to demonstrate self-control will improve Outcome: Adequate for Discharge   Problem: Health Behavior/Discharge Planning: Goal: Identification of resources available to assist in meeting health care needs will improve Outcome: Adequate for Discharge Goal: Compliance with treatment plan for underlying cause of condition will improve Outcome: Adequate for Discharge   Problem: Physical Regulation: Goal: Ability to maintain clinical  measurements within normal limits will improve Outcome: Adequate for Discharge   Problem: Safety: Goal: Periods of time without injury will increase Outcome: Adequate for Discharge   Problem: Activity: Goal: Will identify at least one activity in which they can participate Outcome: Adequate for Discharge   Problem: Coping: Goal: Ability to identify and develop effective coping behavior will improve Outcome: Adequate for Discharge Goal: Ability to interact with others will improve Outcome: Adequate for Discharge Goal: Demonstration of  participation in decision-making regarding own care will improve Outcome: Adequate for Discharge Goal: Ability to use eye contact when communicating with others will improve Outcome: Adequate for Discharge   Problem: Health Behavior/Discharge Planning: Goal: Identification of resources available to assist in meeting health care needs will improve Outcome: Adequate for Discharge   Problem: Self-Concept: Goal: Will verbalize positive feelings about self Outcome: Adequate for Discharge   Problem: Education: Goal: Knowledge of disease or condition will improve Outcome: Adequate for Discharge Goal: Understanding of discharge needs will improve Outcome: Adequate for Discharge   Problem: Health Behavior/Discharge Planning: Goal: Ability to identify changes in lifestyle to reduce recurrence of condition will improve Outcome: Adequate for Discharge Goal: Identification of resources available to assist in meeting health care needs will improve Outcome: Adequate for Discharge   Problem: Physical Regulation: Goal: Complications related to the disease process, condition or treatment will be avoided or minimized Outcome: Adequate for Discharge

## 2024-09-08 NOTE — Progress Notes (Signed)
  [facility] Adult Case Management Discharge Plan :  Will you be returning to the same living situation after discharge:  Yes,  patient will return home.  At discharge, do you have transportation home?: Yes,  SW to assist with transportation.  Do you have the ability to pay for your medications: Yes,  patient has insurance.   Release of information consent forms completed and in the chart;  Patient's signature needed at discharge.  Patient to Follow up at:  Follow-up Information     [facility]. Go on 09/18/2024.   Specialty: Behavioral Health Why: Please go to this provider on 09/18/24 at 7:00 am for an assessment, to obtain medication management services.  You may also go Monday through Friday, arrive by 7:00 am for your initial assessment. Contact information: [address] [place]  [address] [phone]        [place] Go on 09/14/2024.   Specialty: Professional Counselor Why: Please go to this provider on 09/14/24 at 9:00 am for an assessment, to obtain therapy services. You may also go Monday through Friday, from 9 am to 1 pm. Contact information: [address] [place]  [address] [place] [address] [phone]                 Next level of care provider has access to [facility] Link:no  Safety Planning and Suicide Prevention discussed: Yes,  SPE completed with patient and provided with Suicide Prevention Information pamphlet.    Has patient been referred to the Quitline?: Patient refused referral for treatment  Patient has been referred for addiction treatment: Patient refused referral for treatment.  Hunter JONELLE Lever, LCSWA 09/08/2024, 1:08 PM

## 2024-09-08 NOTE — BHH Suicide Risk Assessment (Signed)
 [facility] Discharge Suicide Risk Assessment   Principal Problem: Bipolar I disorder, most recent episode (or current) manic, moderate with mixed features (HCC) Discharge Diagnoses: Principal Problem:   Bipolar I disorder, most recent episode (or current) manic, moderate with mixed features (HCC) Active Problems:   Post traumatic stress disorder (PTSD)   MDD (major depressive disorder), recurrent severe, without psychosis (HCC)    Demographic Factors:  Adolescent or young adult, Low socioeconomic status, and Unemployed  Loss Factors: Financial problems/change in socioeconomic status  Historical Factors: Impulsivity  Risk Reduction Factors:   Living with another person, especially a relative and Positive social support  Continued Clinical Symptoms:  Bipolar Disorder  Cognitive Features That Contribute To Risk:  Poor insight    Suicide Risk:  Mild:  Suicidal ideation of limited frequency, intensity, duration, and specificity.  There are no identifiable plans, no associated intent, mild dysphoria and related symptoms, good self-control (both objective and subjective assessment), few other risk factors, and identifiable protective factors, including available and accessible social support.   Follow-up Information     [facility]. Go on 09/18/2024.   Specialty: Behavioral Health Why: Please go to this provider on 09/18/24 at 7:00 am for an assessment, to obtain medication management services.  You may also go Monday through Friday, arrive by 7:00 am for your initial assessment. Contact information: [address] [place]  [phone]        [place] Go on 09/14/2024.   Specialty: Professional Counselor Why: Please go to this provider on 09/14/24 at 9:00 am for an assessment, to obtain therapy services. You may also go Monday through Friday, from 9 am to 1 pm. Contact information: [address] [place]   [address] [place] [address] [phone]                   Oliva DELENA Salmon, DO 09/08/2024, 9:38 AM

## 2024-09-08 NOTE — Progress Notes (Signed)
(  Sleep Hours) - 9.75 (Any PRNs that were needed, meds refused, or side effects to meds)-  Pt refused scheduled Trazodone   (Any disturbances and when (visitation, over night)- None (Concerns raised by the patient)- None (SI/HI/AVH)- Denies, contracts for safety

## 2024-09-08 NOTE — Group Note (Signed)
 Date:  09/08/2024 Time:  11:00 AM  Group Topic/Focus:  Goals Group:   The focus of this group is to help patients establish daily goals to achieve during treatment and discuss how the patient can incorporate goal setting into their daily lives to aide in recovery.    Participation Level:  Did Not Attend  Participation Quality:  N/A  Affect:  N/A  Cognitive:  N/A  Insight: None  Engagement in Group:  None  Modes of Intervention:  N/A  Additional Comments:  Patient did not attend goals group.  Kristi HERO Zarra Geffert 09/08/2024, 11:00 AM

## 2024-09-08 NOTE — Group Note (Signed)
 Date:  09/08/2024 Time:  11:47 AM  Group Topic/Focus:  Exploring control, boundaries, and coping mechanisms  Patients engaged in a creative exercise where they each drew on a blank piece of paper, then passed their papers around, adding to each other's drawings. This activity helped explore the experience of sharing control. Afterward, the group reflected on how it felt to let others contribute to their drawings, and how this mirrors real-life experiences of navigating change.  The conversation then focused on boundaries, with worksheets and scenarios sparking a discussion on healthy vs. unhealthy boundaries and coping strategies. Patients explored how these concepts connect to personal control and how they can positively impact relationships and daily life. The session ended with a focus on how setting healthy boundaries and using positive coping strategies can help restore a sense of control and emotional well-being.  Participation Level:  Did Not Attend  Participation Quality:  N/A  Affect:  N/A  Cognitive:  N/A  Insight: None  Engagement in Group:  None  Modes of Intervention:  N/A  Additional Comments:  Patient did not attend this group.  Kristi HERO Chea Malan 09/08/2024, 11:47 AM

## 2024-09-08 NOTE — BHH Suicide Risk Assessment (Signed)
 [facility] INPATIENT:  Family/Significant Other Suicide Prevention Education  Suicide Prevention Education:   SPE completed with patient, as patient refused to consent to family contact. SPI pamphlet provided to patient and patient was encouraged to share information with support network, ask questions, and talk about any concerns relating to SPE. Patient denies access to guns/firearms and verbalized understanding of information provided. Mobile Crisis information and [phone] also provided to patient.

## 2024-10-25 ENCOUNTER — Ambulatory Visit (HOSPITAL_COMMUNITY)
Admission: EM | Admit: 2024-10-25 | Discharge: 2024-10-25 | Disposition: A | Discharge status: Home / Self Care | Payer: MEDICAID

## 2024-10-25 NOTE — ED Notes (Signed)
 Patient left after being triaged stating she was going to get something to eat. Triage encouraged patient to stay. Patient decided to leaving stating she would come back after she had something to eat.

## 2024-11-29 ENCOUNTER — Ambulatory Visit (HOSPITAL_COMMUNITY)
Admission: EM | Admit: 2024-11-29 | Discharge: 2024-11-29 | Disposition: A | Discharge status: Home / Self Care | Payer: MEDICAID | Attending: Psychiatry | Admitting: Psychiatry

## 2024-11-29 DIAGNOSIS — F3162 Bipolar disorder, current episode mixed, moderate: Secondary | ICD-10-CM | POA: Insufficient documentation

## 2024-11-29 DIAGNOSIS — F1410 Cocaine abuse, uncomplicated: Secondary | ICD-10-CM | POA: Insufficient documentation

## 2024-11-29 DIAGNOSIS — F4312 Post-traumatic stress disorder, chronic: Secondary | ICD-10-CM | POA: Insufficient documentation
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
## 2024-11-29 NOTE — Discharge Instructions (Addendum)
 Open access clinic TODAY: Follow up with [facility] Residents Only WITHOUT private insurance.  Walk-in hours for open access (medication management and therapy) are Monday - Friday 8 am to 11 am. Appointments are limited, so please arrive BEFORE 7:00 am. Upon arrival, please complete the form on the clipboard located at the front desk. If there are no clipboards available, all appointments have been filled for that day.  [facility] [address] [place]  [address] [phone]   Activity: Daily physical activity; reduce sedentary behaviors   Diet: Portion-limited diet rich in produce, whole (minimally processed) grains, nuts/seeds, eggs, beans/legumes, seafood, lowfat dairy (if tolerated), fermented food, lean meat. Copious water intake. Limit (ultra)processed foods and sugar-sweetened beverages.    Other: -Follow-up with outpatient psychiatric provider [facility]) today.   -Take your psychiatric medications as prescribed.   -Follow-up with outpatient primary care doctor to optimize health maintenance/preventative care.   -If you are prescribed an atypical antipsychotic medication, we recommend that your outpatient psychiatrist follow routine screening for side effects within 3 months of discharge, including monitoring: AIMS scale, height, weight, blood pressure, fasting lipid panel, HbA1c, and fasting blood sugar.    -Recommend total abstinence from alcohol, tobacco, and other illicit drug use at discharge.    -If your psychiatric symptoms recur, worsen, or if you have side effects to your psychiatric medications, call your outpatient psychiatric provider, 911, 988 or go to the nearest emergency department.   -If suicidal thoughts occur, immediately call your outpatient psychiatric provider, 911, 988 or go to the nearest emergency department.

## 2024-11-29 NOTE — ED Provider Notes (Signed)
 Behavioral Health Urgent Care Medical Screening Exam  Patient Name: Tamara Guzman MRN: [id number] Date of Evaluation: 11/29/2024 Chief Complaint:  why did I ever start Diagnosis:  Final diagnoses:  Cocaine use disorder (HCC)  Bipolar 1 disorder, mixed, moderate (HCC)  Chronic posttraumatic stress disorder    Triage History of Present illness: Tamara Guzman is a 23 y.o. female presents to [facility] as a voluntary walk-in, unaccompanied requesting substance abuse treatment. Pt reports using crack and last used yesterday. Pt has been using crack on a daily basis for about a year. Per chart review, pt has history of MDD, Bipolar and anxiety. Pt denies taking prescribed medications, nor is she established with outpatient therapist at this time. Pt currently denies SI,HI,AVH,   Chief Complaint The patient, Tamara Guzman, presents in a state of distress, expressing an urgent need for fucking help and stating, I can't live like this. This is driving me crazy. Absolutely. I can't deal with myself. She reports using crack cocaine and feels that the substance use is out of control. The patient appears ambivalent about receiving help, alternating between expressing a desire for assistance and wanting to leave the facility immediately. She also expresses feelings of worthlessness, stating, I don't really care about me anywhere, I don't care about me anywhere, what's the point. History of Present Illness Not mentioned regarding history of present illness. Treatment History The patient reports having been in an inpatient setting before, which seems to contribute to her current hesitation about being admitted again. When the physician mentions the possibility of an inpatient stay, the patient indicates a reluctance, maybe you don't want to be impatient right now. Is that what's going on? Okay.  Mental Status Examination The patient presented as highly agitated and distressed. Her behavior was  erratic, shifting between wanting to leave and expressing a need for help. Her speech was pressured and contained profanity, with statements like I need fucking help and What the fuck, man. Her mood appeared dysphoric and anxious, and her affect was congruent but labile. She expressed feelings of hopelessness (what's the point) and self-deprecation (I can't deal with myself). She denied current suicidal ideation. She exhibited some paranoid-like thinking or auditory sensitivity, reacting to a sound she described as paperwork. She became increasingly upset when security confiscated and disposed of her crack pipe and lighter, indicating poor impulse control and significant distress related to her substance use. Therapeutic Interventions The physician utilized de-escalation techniques and reflective listening to validate the patient's distress while maintaining a calm and supportive demeanor. The physician assessed for immediate risk by asking directly about thoughts of self-harm. Information and options were provided regarding the facility's walk-in clinic, empowering the patient by explaining that she could talk to someone and then decide whether to be admitted or go home. The physician offered practical support, such as water and a bus pass, to build rapport and address basic needs. The physician attempted to set boundaries while respecting the patient's autonomy, stating, if you decide you want to leave, you can leave, but also encouraging her to consider the available help (Just check it out. What harm could it be?).  Physician attempted to convince patient to access clinic today. She declined and insisted on leaving. She was somewhat distressed that security discarded her paraphernalia. Patient left facility then returned to rant about her paramour needing hospitalization and then cursed at reception staff prior to departing.  Previous Psych Diagnoses:  Prior [facility] inpatient psychiatric  admissions/treatment since her adolescent's years till  present (most recent 09/05/24): There was a record of intentional overdose & for for worsening suicidal ideations x 2 months Current/prior outpatient treatment: Previously [facility]. Patient does not make her outpatient psychiatric appointments.  Prior rehab hx: Previously, but none currently Psychotherapy hx: Yes History of suicide attempt: Yes x 2 History of homicide or aggression: Denies Psychiatric medication history: Patient has been on trial Latuda , clonidine , lithium , Abilify , Zoloft, Zyprexa , Seroquel .  She denies ever taking Depakote, Tegretol, Trileptal , Effexor, Cymbalta, Geodon , Prozac , or Celexa. Psychiatric medication compliance history: Noncompliant. Neuromodulation history: Denies Current Psychiatrist:none Current therapist: none  Past Medical History:      Past Medical History:  Diagnosis Date   ADHD     Bipolar 1 disorder (HCC)     Borderline personality disorder (HCC)     Depression     GAD (generalized anxiety disorder)     H/O psychiatric hospitalization     History of medication noncompliance     Obesity     PTSD (post-traumatic stress disorder)     [facility])               Past Surgical History:  Procedure Laterality Date   FOOT SURGERY Right       Flowsheet Row ED from 11/29/2024 in [facility] Most recent reading at 11/29/2024  6:34 AM [facility] ([facility] Most recent reading at 09/04/2024  5:06 PM ED from 09/04/2024 in [facility] Most recent reading at 09/04/2024  6:38 AM  C-SSRS RISK CATEGORY No Risk Low Risk No Risk    Psychiatric Specialty Exam  Presentation  General Appearance:Disheveled  Eye Contact:Fleeting  Speech:Pressured  Speech Volume:-- (variable)  Handedness:Right   Mood and Affect  Mood: Anxious; Dysphoric; Labile;  Irritable  Affect: Labile; Tearful; Depressed   Thought Process  Thought Processes: Disorganized  Descriptions of Associations:Intact  Orientation:Full (Time, Place and Person)  Thought Content:Perseveration; Scattered  Diagnosis of Schizophrenia or Schizoaffective disorder in past: No   Hallucinations:None  Ideas of Reference:None  Suicidal Thoughts:No (denied) -- (Denies)  Homicidal Thoughts:No   Sensorium  Memory: Immediate Fair  Judgment: Impaired  Insight: Shallow   Executive Functions  Concentration: Poor  Attention Span: Poor  Recall: Poor  Fund of Knowledge: Poor  Language: Fair   Psychomotor Activity  Psychomotor Activity: Restlessness   Assets  Assets: Desire for Improvement   Sleep  Sleep: Good  Number of hours:  8   Physical Exam: Physical Exam ROS Blood pressure 118/84, pulse (!) 102, temperature 97.9 F (36.6 C), temperature source Oral, resp. rate 18, SpO2 98%. There is no height or weight on file to calculate BMI.  Musculoskeletal: Strength & Muscle Tone: within normal limits Gait & Station: normal Patient leans: N/A   [facility]SE Discharge Disposition for Follow up and Recommendations:  Open access clinic TODAY: Follow up with [facility] Residents Only WITHOUT private insurance.  Walk-in hours for open access (medication management and therapy) are Monday - Friday 8 am to 11 am. Appointments are limited, so please arrive BEFORE 7:00 am. Upon arrival, please complete the form on the clipboard located at the front desk. If there are no clipboards available, all appointments have been filled for that day.  [facility] [address] [place]  [address] [phone]   Activity: Daily physical activity; reduce sedentary behaviors   Diet: Portion-limited  diet rich in produce, whole (minimally processed) grains, nuts/seeds, eggs,  beans/legumes, seafood, lowfat dairy (if tolerated), fermented food, lean meat. Copious water intake. Limit (ultra)processed foods and sugar-sweetened beverages.    Other: -Follow-up with outpatient psychiatric provider [facility]) today.   -Take your psychiatric medications as prescribed.   -Follow-up with outpatient primary care doctor to optimize health maintenance/preventative care.   -If you are prescribed an atypical antipsychotic medication, we recommend that your outpatient psychiatrist follow routine screening for side effects within 3 months of discharge, including monitoring: AIMS scale, height, weight, blood pressure, fasting lipid panel, HbA1c, and fasting blood sugar.    -Recommend total abstinence from alcohol, tobacco, and other illicit drug use at discharge.    -If your psychiatric symptoms recur, worsen, or if you have side effects to your psychiatric medications, call your outpatient psychiatric provider, 911, 988 or go to the nearest emergency department.   -If suicidal thoughts occur, immediately call your outpatient psychiatric provider, 911, 988 or go to the nearest emergency department.   KANDI JAYSON HAHN, MD 11/29/2024, 8:04 AM
